# Patient Record
Sex: Female | Born: 1955
Health system: Southern US, Community
[De-identification: ages and names within clinical notes are randomized; demographics above are authoritative.]

## PROBLEM LIST (undated history)

## (undated) DIAGNOSIS — F419 Anxiety disorder, unspecified: Secondary | ICD-10-CM

## (undated) DIAGNOSIS — F319 Bipolar disorder, unspecified: Secondary | ICD-10-CM

## (undated) DIAGNOSIS — K5732 Diverticulitis of large intestine without perforation or abscess without bleeding: Secondary | ICD-10-CM

## (undated) DIAGNOSIS — F32A Depression, unspecified: Secondary | ICD-10-CM

## (undated) DIAGNOSIS — F329 Major depressive disorder, single episode, unspecified: Secondary | ICD-10-CM

## (undated) DIAGNOSIS — K5792 Diverticulitis of intestine, part unspecified, without perforation or abscess without bleeding: Secondary | ICD-10-CM

## (undated) HISTORY — DX: Bipolar disorder, unspecified: F31.9

## (undated) HISTORY — DX: Anxiety disorder, unspecified: F41.9

## (undated) HISTORY — PX: COLONOSCOPY: SHX174

## (undated) HISTORY — DX: Diverticulitis of intestine, part unspecified, without perforation or abscess without bleeding: K57.92

## (undated) HISTORY — DX: Depression, unspecified: F32.A

## (undated) HISTORY — DX: Diverticulitis of large intestine without perforation or abscess without bleeding: K57.32

## (undated) HISTORY — DX: Major depressive disorder, single episode, unspecified: F32.9

## (undated) HISTORY — PX: OTHER SURGICAL HISTORY: SHX169

## (undated) HISTORY — PX: TUBAL LIGATION: SHX77

## (undated) SURGERY — COLONOSCOPY WITH PROPOFOL
Anesthesia: Monitor Anesthesia Care

---

## 2008-07-31 ENCOUNTER — Emergency Department (HOSPITAL_COMMUNITY): Admission: EM | Admit: 2008-07-31 | Discharge: 2008-07-31 | Payer: Self-pay | Admitting: Family Medicine

## 2008-08-08 ENCOUNTER — Ambulatory Visit (HOSPITAL_COMMUNITY): Admission: RE | Admit: 2008-08-08 | Discharge: 2008-08-08 | Payer: Self-pay | Admitting: Family Medicine

## 2008-08-19 ENCOUNTER — Encounter (HOSPITAL_COMMUNITY): Admission: RE | Admit: 2008-08-19 | Discharge: 2008-09-18 | Payer: Self-pay | Admitting: Family Medicine

## 2008-09-18 ENCOUNTER — Ambulatory Visit: Payer: Self-pay | Admitting: *Deleted

## 2008-09-18 ENCOUNTER — Inpatient Hospital Stay (HOSPITAL_COMMUNITY): Admission: AD | Admit: 2008-09-18 | Discharge: 2008-09-19 | Payer: Self-pay | Admitting: *Deleted

## 2008-11-19 ENCOUNTER — Emergency Department (HOSPITAL_COMMUNITY): Admission: EM | Admit: 2008-11-19 | Discharge: 2008-11-20 | Payer: Self-pay | Admitting: Emergency Medicine

## 2008-12-31 ENCOUNTER — Ambulatory Visit (HOSPITAL_COMMUNITY): Admission: RE | Admit: 2008-12-31 | Discharge: 2008-12-31 | Payer: Self-pay | Admitting: Family Medicine

## 2009-01-07 ENCOUNTER — Ambulatory Visit (HOSPITAL_COMMUNITY): Admission: RE | Admit: 2009-01-07 | Discharge: 2009-01-07 | Payer: Self-pay | Admitting: Family Medicine

## 2009-02-11 ENCOUNTER — Ambulatory Visit (HOSPITAL_COMMUNITY): Admission: RE | Admit: 2009-02-11 | Discharge: 2009-02-11 | Payer: Self-pay | Admitting: Family Medicine

## 2010-02-18 ENCOUNTER — Ambulatory Visit (HOSPITAL_COMMUNITY)
Admission: RE | Admit: 2010-02-18 | Discharge: 2010-02-18 | Payer: Self-pay | Source: Home / Self Care | Attending: Internal Medicine | Admitting: Internal Medicine

## 2010-03-10 ENCOUNTER — Emergency Department (HOSPITAL_COMMUNITY)
Admission: EM | Admit: 2010-03-10 | Discharge: 2010-03-10 | Payer: Self-pay | Source: Home / Self Care | Admitting: Emergency Medicine

## 2010-03-12 ENCOUNTER — Ambulatory Visit
Admission: RE | Admit: 2010-03-12 | Discharge: 2010-03-12 | Payer: Self-pay | Source: Home / Self Care | Attending: Internal Medicine | Admitting: Internal Medicine

## 2010-04-09 ENCOUNTER — Ambulatory Visit (HOSPITAL_COMMUNITY)
Admission: RE | Admit: 2010-04-09 | Discharge: 2010-04-09 | Disposition: A | Discharge status: Home / Self Care | Payer: BC Managed Care – PPO | Attending: Internal Medicine | Admitting: Internal Medicine

## 2010-04-09 ENCOUNTER — Other Ambulatory Visit (INDEPENDENT_AMBULATORY_CARE_PROVIDER_SITE_OTHER): Payer: Self-pay | Admitting: Internal Medicine

## 2010-04-09 ENCOUNTER — Ambulatory Visit: Admit: 2010-04-09 | Payer: Self-pay | Admitting: Internal Medicine

## 2010-04-09 ENCOUNTER — Encounter (HOSPITAL_BASED_OUTPATIENT_CLINIC_OR_DEPARTMENT_OTHER): Payer: BC Managed Care – PPO | Admitting: Internal Medicine

## 2010-04-09 DIAGNOSIS — Z860100 Personal history of colon polyps, unspecified: Secondary | ICD-10-CM | POA: Insufficient documentation

## 2010-04-09 DIAGNOSIS — R197 Diarrhea, unspecified: Secondary | ICD-10-CM | POA: Insufficient documentation

## 2010-04-09 DIAGNOSIS — K5289 Other specified noninfective gastroenteritis and colitis: Secondary | ICD-10-CM | POA: Insufficient documentation

## 2010-04-09 DIAGNOSIS — K5730 Diverticulosis of large intestine without perforation or abscess without bleeding: Secondary | ICD-10-CM | POA: Insufficient documentation

## 2010-04-12 NOTE — Consult Note (Signed)
[name], [name]            ACCOUNT NO.:  [id number]  MEDICAL RECORD NO.:  [id number]           PATIENT TYPE:  LOCATION:                                 FACILITY:  PHYSICIAN:  Lionel December, M.D.    DATE OF BIRTH:  Jan 27, 1956  DATE OF CONSULTATION: DATE OF DISCHARGE:                                CONSULTATION   REASON FOR CONSULTATION:  Diarrhea.  HISTORY OF PRESENT ILLNESS:  [name] is a 55 year old white female referred to our office by Dr. Sherwood Gambler for diarrhea of 78-month duration. She states when her symptoms started, her diarrhea was occurring a couple of nights a week and then progressively became heavier.  She states she had been sleeping with a towel under her due to the fecal seepage. The diarrhea is so spontaneous, she does not make it to the bathroom most times.  Her appetite is good.  She has had no weight loss.  She is having approximately 8 stools a day.  Her stools are light brown in color with no substance.  Her stools are very watery.  There is no associated fever with her symptoms.  She does complain of lower abdominal pain which comes and goes.  Previously, she was having 2-3 stools a day before the symptoms started.  She had been on antibiotics 6 months ago fordiverticulitis.  She did go to the emergency room recently this pastTuesday.  Her CT scan was normal.  Her CBC; WBC count was 6.6,  RBC count was 3.86, hemoglobin was 13.0, hematocrit was 37.9, MCV was 98.2, neutrophil 67, no eosinophils.  Sodium was 140, potassium was 3.6, chloride was 104, carbon dioxide 27, glucose 81, BUN 12, creatinine was 0.85, calcium 9.5.  Total protein 6.3, albumin 3.9, AST 16, ALT 15, ALP 63, total bilirubin was 0.3.  Her urine showed 3-6 wbc's and 0-2 rbc's. Her CT revealed scattered diverticula involving the descending and sigmoid colon without evidence of acute diverticulitis.  Normal- appearing small bowel.  A lipoma involving ileocecal valve.  Normal appendix in  the right mid abdomen.  Distending almost to the midline. There was no acute abnormalities involving the abdomen or pelvis.  Her last colonoscopy was February 23, 2010, by Dr. Karilyn Cota and the biopsy revealed a tubular adenoma.  She underwent a colonoscopy for average risk screening colonoscopy.  She is allergic to DILAUDID which causes itching.  SURGERIES:  She has had her ovaries removed for benign tumor.  She has had a tubal ligation.  PREVIOUS MEDICAL HISTORY:  Includes bipolar, high cholesterol, deep depression, diverticulitis, and a left frozen shoulder.  FAMILY HISTORY:  Her mother is deceased from oral cancer.  Her father is alive in good health.  She has 2 sisters in good health.  One brother in good health.  SOCIAL HISTORY:  She is single.  She is unemployed.  She does not smoke, drink, or do drugs.  She has 2 children in good health.  HOME MEDICATIONS: 1. Wellbutrin 150 mg 3 tabs a day. 2. Zoloft 100 mg a day. 3. Simvastatin 20 mg a day. 4. Xanax 2 mg twice a day. 5. Risperdal 0.5  mg at night. 6. Naproxen 500 mg 1 a day. 7. Trazodone 100 mg at night. 8. Lamotrigine 150 mg at night.  OBJECTIVE:  VITAL SIGNS:  Her weight is 192.9, her height is 5 feet 7 inches, her BMI is 31, her temp is 97.6, blood pressure is 102/74, and pulse is 76. HEENT:  Her sclerae anicteric.  Her conjunctivae pink.  She has natural teeth.  Her oral mucosa is moist.  There is no lesions. NECK:  Her thyroid is normal.  There is no cervical lymphadenopathy. LUNGS:  Clear. ABDOMEN:  Soft.  Bowel sounds are present.  Her stool was guaiac negative today.  There was no impaction felt.  There was no edema to her extremities.  ASSESSMENT:  [name] is a 55 year old female, presenting today with diarrhea for 1 month's duration.  An inflammatory process needs to be ruled out.  RECOMMENDATIONS:  We will get a stools studies for O&P, C. difficile, cultures and WBC.  She is to take Imodium 1 in  the morning and 1 at night twice a day or she may increase up to 3 times a day.  If her stool studies are normal, she will either undergo colonoscopy or sigmoidoscopy with Dr. Karilyn Cota and I have discussed this case with Dr. Karilyn Cota.    ______________________________ Dorene Ar, NP   ______________________________ Lionel December, M.D.    TS/MEDQ  D:  03/12/2010  T:  03/13/2010  Job:  [id number]  Electronically Signed by Dorene Ar PA on 04/09/2010 09:26:15 AM Electronically Signed by Lionel December M.D. on 04/12/2010 01:47:43 PM

## 2010-04-18 NOTE — Op Note (Signed)
  [name], [name]            ACCOUNT NO.:  [id number]  MEDICAL RECORD NO.:  [id number]           PATIENT TYPE:  O  LOCATION:  DAYP                          FACILITY:  [facility]  PHYSICIAN:  Lionel December, M.D.    DATE OF BIRTH:  09-Mar-1955  DATE OF PROCEDURE:  04/09/2010 DATE OF DISCHARGE:                              OPERATIVE REPORT   PROCEDURE:  Colonoscopy.  INDICATION:  [name] is 55 year old Caucasian female with several week history of diarrhea and negative stool studies.  Her last colonoscopy was over 3 years ago with removal of a small tubular adenoma.  She is undergoing diagnostic colonoscopy.  Procedures were reviewed with the patient.  Informed consent was obtained.  MEDS FOR CONSCIOUS SEDATION:  Demerol 40 mg IV, Versed 8 mg IV, promethazine 12.5 mg IV in diluted form.  FINDINGS:  Procedure performed in endoscopy suite.  The patient's vital signs and O2 sat were monitored during the procedure and remained stable.  The patient was placed in left lateral recumbent position and rectal examination performed.  No abnormality noted on external or digital exam.  Pentax videoscope was placed through rectum and advanced under vision into sigmoid colon beyond.  Preparation was excellent.  She had scattered diverticula at sigmoid and descending colon.  Scope was passed into cecum which was identified by lipomatous ileocecal valve. There are two small lipomas at the cecum.  The larger of the two was about 7-8 mm.  Short segment of GI was also examined and revealed normal- appearing mucosa.  As the scope was withdrawn, colonic mucosa was once again carefully examined and was normal throughout.  Random biopsies were taken from mucosa of sigmoid colon.  Rectal mucosa similarly was normal.  Scope was retroflexed to examine, anorectal junction was unremarkable.  Endoscope was withdrawn.  Withdrawal time was 9 minutes. The patient tolerated the procedure well.  FINAL  DIAGNOSES: 1. Normal terminal ileum. 2. Mild left-sided diverticulosis. 3. Normal colonic mucosa.  Biopsies taken from sigmoid colon looking     for microscopic and/or collagenous colitis.  RECOMMENDATIONS: 1. Standard instructions given. 2. High-fiber diet plus fiber supplement 3-4 g daily.  She can take 1-     2 mg Imodium OTC daily p.r.n. if she needs to.  I will be     contacting patient with results of biopsy and further     recommendations.     Lionel December, M.D.     NR/MEDQ  D:  04/09/2010  T:  04/09/2010  Job:  [id number]  cc:   Madelin Rear. Sherwood Gambler, MD Fax: [phone]  Electronically Signed by Lionel December M.D. on 04/12/2010 01:48:06 PM

## 2010-05-18 LAB — URINE MICROSCOPIC-ADD ON

## 2010-05-18 LAB — COMPREHENSIVE METABOLIC PANEL
ALT: 15 U/L (ref 0–35)
AST: 16 U/L (ref 0–37)
Albumin: 3.9 g/dL (ref 3.5–5.2)
Alkaline Phosphatase: 63 U/L (ref 39–117)
BUN: 12 mg/dL (ref 6–23)
CO2: 27 mEq/L (ref 19–32)
Calcium: 9.5 mg/dL (ref 8.4–10.5)
Chloride: 104 mEq/L (ref 96–112)
Creatinine, Ser: 0.85 mg/dL (ref 0.4–1.2)
GFR calc Af Amer: >60 mL/min (ref >60)
GFR calc non Af Amer: >60 mL/min (ref >60)
Glucose, Bld: 81 mg/dL (ref 70–99)
Potassium: 3.6 mEq/L (ref 3.5–5.1)
Sodium: 140 mEq/L (ref 135–145)
Total Bilirubin: 0.3 mg/dL (ref 0.3–1.2)
Total Protein: 6.3 g/dL (ref 6.0–8.3)

## 2010-05-18 LAB — DIFFERENTIAL
Basophils Absolute: 0 10*3/uL (ref 0.0–0.1)
Basophils Relative: 0 % (ref 0–1)
Eosinophils Absolute: 0 10*3/uL (ref 0.0–0.7)
Eosinophils Relative: 0 % (ref 0–5)
Lymphocytes Relative: 21 % (ref 12–46)
Lymphs Abs: 1.4 10*3/uL (ref 0.7–4.0)
Monocytes Absolute: 0.7 10*3/uL (ref 0.1–1.0)
Monocytes Relative: 11 % (ref 3–12)
Neutro Abs: 4.5 10*3/uL (ref 1.7–7.7)
Neutrophils Relative %: 67 % (ref 43–77)

## 2010-05-18 LAB — URINALYSIS, ROUTINE W REFLEX MICROSCOPIC
Bilirubin Urine: NEGATIVE
Glucose, UA: NEGATIVE mg/dL
Ketones, ur: NEGATIVE mg/dL
Leukocytes, UA: NEGATIVE
Nitrite: NEGATIVE
Protein, ur: NEGATIVE mg/dL
Specific Gravity, Urine: 1.01 (ref 1.005–1.030)
Urobilinogen, UA: 0.2 mg/dL (ref 0.0–1.0)
pH: 5.5 (ref 5.0–8.0)

## 2010-05-18 LAB — CBC
HCT: 37.9 % (ref 36.0–46.0)
Hemoglobin: 13 g/dL (ref 12.0–15.0)
MCH: 33.7 pg (ref 26.0–34.0)
MCHC: 34.3 g/dL (ref 30.0–36.0)
MCV: 98.2 fL (ref 78.0–100.0)
Platelets: 192 10*3/uL (ref 150–400)
RBC: 3.86 MIL/uL — ABNORMAL LOW (ref 3.87–5.11)
RDW: 12.9 % (ref 11.5–15.5)
WBC: 6.6 10*3/uL (ref 4.0–10.5)

## 2010-05-18 LAB — LIPASE, BLOOD: Lipase: 31 U/L (ref 11–59)

## 2010-05-18 LAB — STOOL CULTURE

## 2010-05-25 ENCOUNTER — Ambulatory Visit (INDEPENDENT_AMBULATORY_CARE_PROVIDER_SITE_OTHER): Payer: BC Managed Care – PPO | Admitting: Internal Medicine

## 2010-06-12 LAB — CBC
HCT: 38.8 % (ref 36.0–46.0)
Hemoglobin: 13.7 g/dL (ref 12.0–15.0)
MCHC: 35.4 g/dL (ref 30.0–36.0)
MCV: 100.1 fL — ABNORMAL HIGH (ref 78.0–100.0)
Platelets: 235 10*3/uL (ref 150–400)
RBC: 3.87 MIL/uL (ref 3.87–5.11)
RDW: 12.8 % (ref 11.5–15.5)
WBC: 6.8 10*3/uL (ref 4.0–10.5)

## 2010-06-12 LAB — DIFFERENTIAL
Basophils Absolute: 0 10*3/uL (ref 0.0–0.1)
Basophils Relative: 0 % (ref 0–1)
Eosinophils Absolute: 0 10*3/uL (ref 0.0–0.7)
Eosinophils Relative: 0 % (ref 0–5)
Lymphocytes Relative: 31 % (ref 12–46)
Lymphs Abs: 2.1 10*3/uL (ref 0.7–4.0)
Monocytes Absolute: 0.6 10*3/uL (ref 0.1–1.0)
Monocytes Relative: 9 % (ref 3–12)
Neutro Abs: 4.1 10*3/uL (ref 1.7–7.7)
Neutrophils Relative %: 61 % (ref 43–77)

## 2010-06-12 LAB — BASIC METABOLIC PANEL
BUN: 11 mg/dL (ref 6–23)
CO2: 32 mEq/L (ref 19–32)
Calcium: 9.4 mg/dL (ref 8.4–10.5)
Chloride: 105 mEq/L (ref 96–112)
Creatinine, Ser: 0.69 mg/dL (ref 0.4–1.2)
GFR calc Af Amer: >60 mL/min (ref >60)
GFR calc non Af Amer: >60 mL/min (ref >60)
Glucose, Bld: 103 mg/dL — ABNORMAL HIGH (ref 70–99)
Potassium: 3.7 mEq/L (ref 3.5–5.1)
Sodium: 139 mEq/L (ref 135–145)

## 2010-06-12 LAB — URINALYSIS, ROUTINE W REFLEX MICROSCOPIC
Bilirubin Urine: NEGATIVE
Glucose, UA: NEGATIVE mg/dL
Ketones, ur: NEGATIVE mg/dL
Leukocytes, UA: NEGATIVE
Nitrite: NEGATIVE
Protein, ur: NEGATIVE mg/dL
Specific Gravity, Urine: 1.015 (ref 1.005–1.030)
Urobilinogen, UA: 0.2 mg/dL (ref 0.0–1.0)
pH: 6 (ref 5.0–8.0)

## 2010-06-12 LAB — RAPID URINE DRUG SCREEN, HOSP PERFORMED
Amphetamines: NOT DETECTED
Barbiturates: NOT DETECTED
Benzodiazepines: POSITIVE — AB
Cocaine: NOT DETECTED
Opiates: NOT DETECTED
Tetrahydrocannabinol: NOT DETECTED

## 2010-06-12 LAB — URINE MICROSCOPIC-ADD ON

## 2010-06-12 LAB — ETHANOL: Alcohol, Ethyl (B): <5 mg/dL (ref 0–10)

## 2010-06-15 LAB — COMPREHENSIVE METABOLIC PANEL
ALT: 16 U/L (ref 0–35)
AST: 17 U/L (ref 0–37)
Albumin: 3.8 g/dL (ref 3.5–5.2)
Alkaline Phosphatase: 90 U/L (ref 39–117)
BUN: 10 mg/dL (ref 6–23)
CO2: 28 mEq/L (ref 19–32)
Calcium: 9.5 mg/dL (ref 8.4–10.5)
Chloride: 108 mEq/L (ref 96–112)
Creatinine, Ser: 0.72 mg/dL (ref 0.4–1.2)
GFR calc Af Amer: >60 mL/min (ref >60)
GFR calc non Af Amer: >60 mL/min (ref >60)
Glucose, Bld: 98 mg/dL (ref 70–99)
Potassium: 4.7 mEq/L (ref 3.5–5.1)
Sodium: 140 mEq/L (ref 135–145)
Total Bilirubin: 0.6 mg/dL (ref 0.3–1.2)
Total Protein: 6.6 g/dL (ref 6.0–8.3)

## 2010-06-15 LAB — TSH: TSH: 1.522 u[IU]/mL (ref 0.350–4.500)

## 2010-06-15 LAB — CBC
HCT: 42.7 % (ref 36.0–46.0)
Hemoglobin: 14.5 g/dL (ref 12.0–15.0)
MCHC: 34 g/dL (ref 30.0–36.0)
MCV: 101.6 fL — ABNORMAL HIGH (ref 78.0–100.0)
Platelets: 254 10*3/uL (ref 150–400)
RBC: 4.2 MIL/uL (ref 3.87–5.11)
RDW: 13.3 % (ref 11.5–15.5)
WBC: 7.4 10*3/uL (ref 4.0–10.5)

## 2010-06-16 LAB — URINALYSIS, ROUTINE W REFLEX MICROSCOPIC
Bilirubin Urine: NEGATIVE
Glucose, UA: NEGATIVE mg/dL
Ketones, ur: NEGATIVE mg/dL
Leukocytes, UA: NEGATIVE
Nitrite: NEGATIVE
Protein, ur: NEGATIVE mg/dL
Specific Gravity, Urine: 1.02 (ref 1.005–1.030)
Urobilinogen, UA: 0.2 mg/dL (ref 0.0–1.0)
pH: 6 (ref 5.0–8.0)

## 2010-06-16 LAB — COMPREHENSIVE METABOLIC PANEL
ALT: 24 U/L (ref 0–35)
AST: 24 U/L (ref 0–37)
Albumin: 4.1 g/dL (ref 3.5–5.2)
Alkaline Phosphatase: 83 U/L (ref 39–117)
BUN: 9 mg/dL (ref 6–23)
CO2: 31 mEq/L (ref 19–32)
Calcium: 9.9 mg/dL (ref 8.4–10.5)
Chloride: 106 mEq/L (ref 96–112)
Creatinine, Ser: 0.68 mg/dL (ref 0.4–1.2)
GFR calc Af Amer: >60 mL/min (ref >60)
GFR calc non Af Amer: >60 mL/min (ref >60)
Glucose, Bld: 81 mg/dL (ref 70–99)
Potassium: 4.5 mEq/L (ref 3.5–5.1)
Sodium: 142 mEq/L (ref 135–145)
Total Bilirubin: 0.4 mg/dL (ref 0.3–1.2)
Total Protein: 6.9 g/dL (ref 6.0–8.3)

## 2010-06-16 LAB — CBC
HCT: 41.5 % (ref 36.0–46.0)
Hemoglobin: 14.8 g/dL (ref 12.0–15.0)
MCHC: 35.6 g/dL (ref 30.0–36.0)
MCV: 100 fL (ref 78.0–100.0)
Platelets: 258 10*3/uL (ref 150–400)
RBC: 4.15 MIL/uL (ref 3.87–5.11)
RDW: 13.2 % (ref 11.5–15.5)
WBC: 8.1 10*3/uL (ref 4.0–10.5)

## 2010-06-16 LAB — DIFFERENTIAL
Basophils Absolute: 0 10*3/uL (ref 0.0–0.1)
Basophils Relative: 0 % (ref 0–1)
Eosinophils Absolute: 0 10*3/uL (ref 0.0–0.7)
Eosinophils Relative: 0 % (ref 0–5)
Lymphocytes Relative: 28 % (ref 12–46)
Lymphs Abs: 2.3 10*3/uL (ref 0.7–4.0)
Monocytes Absolute: 0.7 10*3/uL (ref 0.1–1.0)
Monocytes Relative: 9 % (ref 3–12)
Neutro Abs: 5.1 10*3/uL (ref 1.7–7.7)
Neutrophils Relative %: 63 % (ref 43–77)

## 2010-06-16 LAB — URINE MICROSCOPIC-ADD ON

## 2010-06-16 LAB — LIPASE, BLOOD: Lipase: 26 U/L (ref 11–59)

## 2010-07-21 NOTE — Discharge Summary (Signed)
[name], [name]            ACCOUNT NO.:  [id number]   MEDICAL RECORD NO.:  [id number]          PATIENT TYPE:  IPS   LOCATION:  0305                          FACILITY:  [facility]   PHYSICIAN:  [name], M.D. DATE OF BIRTH:  Jun 16, 1955   DATE OF ADMISSION:  09/18/2008  DATE OF DISCHARGE:  09/19/2008                               DISCHARGE SUMMARY   IDENTIFICATION:  This is a 55 year old divorced white female from  [place], [place] who was admitted on a voluntary basis.   HISTORY OF PRESENT ILLNESS:  The patient was sent here by her therapist  due to not functioning.  For the past 9 months, the patient has had  panic attacks 2-3 times daily.  She reports her mother passed away in  November 23, 2022 and she has stayed with her father for 7 months since then.  He has not been doing well and she is very concerned about his mental  status.  She states now she is not suicidal and never was.  She had made  a vague comment about not wanting to live like this or about not caring  if she lives.  However, she states she is very religious and would not  attempt to hurt herself and would not do this to her family. The patient  will see Dr. Tomasa [name] as an outpatient on September 22, 2008.  She has  recently seen [name], the therapist in Dr. Milas Hock clinic at  [facility].  She likes her and wants to continue working  with her.  She is currently on trazodone 100 mg p.o. q.h.s., the Effexor  75 mg b.i.d., and lorazepam 2 mg t.i.d.   SUBSTANCE ABUSE HISTORY:  The patient denies any alcohol or drug use.   PAST MEDICAL HISTORY:  The patient has diverticulitis and IBS.  She is  on medications as indicated above in the past psychiatric history.   ALLERGIES:  She is allergic to Demerol, which causes itching.   SOCIAL HISTORY:  The patient was with her father and has a very  supportive family with 3 siblings.  Father is also supportive.  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  She has no legal problems reported.   PHYSICAL FINDINGS:  There were no acute physical or medical problems  noted.   HOSPITAL COURSE:  As indicated above upon admission, the patient stated  she was not suicidal.  She was casually dressed with good eye contact.  Motor behavior was within normal limits and speech was normal rate and  flow.  She was alert and oriented x4.  She was depressed and anxious  with appropriate affect.  However, there was no suicidal or homicidal  ideation.  No evidence of psychosis or thought disorder.  The patient  was stable and wanted to return home.  She reports she has a stable  supportive extended family and also her father in addition she has 2  sons to a very supportive.  Her one daughter-in-law was due to have a  grandchild anytime now and she  wanted to be out to be there for the  birth of the baby.  The patient was felt to be safe for discharge today  and she wanted to go home.  Followup was continued with both Dr.  Tomasa [name] and [name] at [facility].   DISCHARGE DIAGNOSES:  Axis I:  Major depressive disorder, single episode  severe without psychotic features, and anxiety disorder, not otherwise  specified.  Axis II:  None.  Axis III:  Diverticulitis and irritable bowel syndrome.  Axis IV:  Severe (loss of mother, caring for her elderly father, and  periods of psychiatric illness).  Axis V:  Global assessment of functioning was 50 upon discharge.  Global  assessment of functioning was 30 upon admission.  Global assessment of  functioning highest past year was 75.   DISCHARGE PLANS:  There was no specific activity level or dietary  restrictions.   POST HOSPITAL CARE PLANS:  The patient will see Dr. Tomasa [name] on September 22, 2008, at 9 o'clock.  She will see [name] at [facility] for Dr. Tomasa [name] works for followup therapy.   DISCHARGE  MEDICATIONS:  1. Effexor XR 75 mg 1 p.o. b.i.d.  2. Trazodone as prescribed for sleep.  3. Ativan as prescribed for anxiety.  TSH was done and was 1.522 on      September 19, 2008, which was within normal limits.      [name], M.D.  Electronically Signed     [facility]/MEDQ  D:  09/19/2008  T:  09/20/2008  Job:  [id number]

## 2010-07-31 ENCOUNTER — Other Ambulatory Visit (HOSPITAL_COMMUNITY): Payer: Self-pay | Admitting: Internal Medicine

## 2010-07-31 DIAGNOSIS — D4860 Neoplasm of uncertain behavior of unspecified breast: Secondary | ICD-10-CM

## 2010-08-12 ENCOUNTER — Ambulatory Visit (HOSPITAL_COMMUNITY): Payer: BC Managed Care – PPO

## 2011-08-30 ENCOUNTER — Telehealth (INDEPENDENT_AMBULATORY_CARE_PROVIDER_SITE_OTHER): Payer: Self-pay | Admitting: *Deleted

## 2011-08-30 NOTE — Telephone Encounter (Signed)
[facility] has requested a refill on Ondansetron 4 mg tablets, take 1 tablet by mouth every 6 hours as needed for nausea.

## 2011-08-31 NOTE — Telephone Encounter (Signed)
This has been addressed.

## 2013-12-12 ENCOUNTER — Encounter (INDEPENDENT_AMBULATORY_CARE_PROVIDER_SITE_OTHER): Payer: Self-pay | Admitting: *Deleted

## 2013-12-12 ENCOUNTER — Encounter (INDEPENDENT_AMBULATORY_CARE_PROVIDER_SITE_OTHER): Payer: Self-pay | Admitting: Internal Medicine

## 2013-12-12 ENCOUNTER — Ambulatory Visit (INDEPENDENT_AMBULATORY_CARE_PROVIDER_SITE_OTHER): Payer: BC Managed Care – PPO | Admitting: Internal Medicine

## 2013-12-12 VITALS — BP 100/62 | HR 72 | Temp 97.8°F | Ht 67.0 in | Wt 195.1 lb

## 2013-12-12 DIAGNOSIS — F319 Bipolar disorder, unspecified: Secondary | ICD-10-CM

## 2013-12-12 DIAGNOSIS — R109 Unspecified abdominal pain: Secondary | ICD-10-CM

## 2013-12-12 DIAGNOSIS — R100 Acute abdomen: Secondary | ICD-10-CM

## 2013-12-12 DIAGNOSIS — R1030 Lower abdominal pain, unspecified: Secondary | ICD-10-CM

## 2013-12-12 HISTORY — DX: Bipolar disorder, unspecified: F31.9

## 2013-12-12 LAB — CBC WITH DIFFERENTIAL/PLATELET
Basophils Absolute: 0 10*3/uL (ref 0.0–0.1)
Basophils Relative: 0 % (ref 0–1)
Eosinophils Absolute: 0.1 10*3/uL (ref 0.0–0.7)
Eosinophils Relative: 2 % (ref 0–5)
HCT: 41.4 % (ref 36.0–46.0)
Hemoglobin: 14.6 g/dL (ref 12.0–15.0)
Lymphocytes Relative: 20 % (ref 12–46)
Lymphs Abs: 1.4 10*3/uL (ref 0.7–4.0)
MCH: 33 pg (ref 26.0–34.0)
MCHC: 35.3 g/dL (ref 30.0–36.0)
MCV: 93.5 fL (ref 78.0–100.0)
Monocytes Absolute: 0.6 10*3/uL (ref 0.1–1.0)
Monocytes Relative: 8 % (ref 3–12)
Neutro Abs: 5 10*3/uL (ref 1.7–7.7)
Neutrophils Relative %: 70 % (ref 43–77)
Platelets: 246 10*3/uL (ref 150–400)
RBC: 4.43 MIL/uL (ref 3.87–5.11)
RDW: 13.1 % (ref 11.5–15.5)
WBC: 7.1 10*3/uL (ref 4.0–10.5)

## 2013-12-12 MED ORDER — METRONIDAZOLE 500 MG PO TABS: 500.0000 mg | ORAL_TABLET | Freq: Two times a day (BID) | ORAL | Status: DC

## 2013-12-12 MED ORDER — HYDROCODONE-ACETAMINOPHEN 5-300 MG PO TABS: 20.0000 | ORAL_TABLET | Freq: Four times a day (QID) | ORAL | Status: DC | PRN

## 2013-12-12 MED ORDER — CIPROFLOXACIN HCL 500 MG PO TABS: 500.0000 mg | ORAL_TABLET | Freq: Two times a day (BID) | ORAL | Status: DC

## 2013-12-12 NOTE — Patient Instructions (Signed)
CT abdomen/pelvis with CM. GI pathogen, CBC, and CMet. Cipro and Flagyl x 14 days.

## 2013-12-12 NOTE — Progress Notes (Signed)
   Subjective:    Patient ID: [name], female    DOB: 03/31/1955, 58 y.o.   MRN: [id number]  HPI Presents today with c/o that for the past week she has been having diarrhea. She was having 5-6 stools a day. Saturday she had abdominal pain. She had pain in the lower abdomen.  She says the bowel movements increased. For the past 48 hrs her stools have increased to 10 a day and in small amts She says when she eats, she will have diarrhea. She says the pain comes and goes in waves. She says when she had her last flare of diverticular flare, she had constipation and had a fever. She says she has been running her fire logs at home. She is not sure if she has ran a fever.  Admitted to [facility] 7 yrs ago with diverticulitis x  5 days She is on a bland diet.  Recently returned from [place] from a business trip. No recent antibiotics Hx of Lympocytic colitis in 2013 and treated with Entocort EC 9mg  x  2 weeks, 6mg  x 2 weeks, 3mg  x 2 weeks.   04/10/2011 Colonoscopy:  PROCEDURE: Colonoscopy.  INDICATION: [name] is 58 year old Caucasian female with several week  history of diarrhea and negative stool studies. Her last colonoscopy  was over 3 years ago with removal of a small tubular adenoma. She is  undergoing diagnostic colonoscopy. Procedures were reviewed with the  patient. Informed consent was obtained.   FINAL DIAGNOSES:  1. Normal terminal ileum.  2. Mild left-sided diverticulosis.  3. Normal colonic mucosa. Biopsies taken from sigmoid colon looking  for microscopic and/or collagenous colitis.  Biopsy: lymphocytic colitis.  Review of Systems     Objective:   Physical Exam  Filed Vitals:   12/12/13 1518  BP: 100/62  Pulse: 72  Temp: 97.8 F (36.6 C)  Height: 5\' 7"  (1.702 m)  Weight: 195 lb 1.6 oz (88.497 kg)  Alert and oriented. Skin warm and dry. Oral mucosa is moist.   . Sclera anicteric, conjunctivae is pink. Thyroid not enlarged. No cervical lymphadenopathy. Lungs  clear. Heart regular rate and rhythm.  Abdomen is soft. Bowel sounds are positive. No hepatomegaly. No abdominal masses felt. No tenderness.  No edema to lower extremities.          Assessment & Plan:  Lower abdominal pain. Possible diverticulitis. Possible lymphocytic colitis.  Diarrhea: no recent antibiotics. Patient was advised to go to the ED if pain became unbearable.   Imodium BID. Bland diet. CT abdomen/pelvis with CM.  CBC, CMET. Stool studies vicodin #20 Rx. No refills.  Rx for Cipro and Flagyl x 14 days If CT does not show diverticulitis am going stop Cipro and Flagyl and start her on Entocort.

## 2013-12-13 ENCOUNTER — Ambulatory Visit (HOSPITAL_COMMUNITY)
Admission: RE | Admit: 2013-12-13 | Discharge: 2013-12-13 | Disposition: A | Discharge status: Home / Self Care | Payer: BC Managed Care – PPO | Source: Ambulatory Visit | Attending: Internal Medicine | Admitting: Internal Medicine

## 2013-12-13 DIAGNOSIS — K760 Fatty (change of) liver, not elsewhere classified: Secondary | ICD-10-CM | POA: Diagnosis not present

## 2013-12-13 DIAGNOSIS — R1030 Lower abdominal pain, unspecified: Secondary | ICD-10-CM

## 2013-12-13 DIAGNOSIS — K529 Noninfective gastroenteritis and colitis, unspecified: Secondary | ICD-10-CM | POA: Insufficient documentation

## 2013-12-13 DIAGNOSIS — R109 Unspecified abdominal pain: Secondary | ICD-10-CM | POA: Diagnosis present

## 2013-12-13 LAB — GASTROINTESTINAL PATHOGEN PANEL PCR
C. difficile Tox A/B, PCR: NEGATIVE
Campylobacter, PCR: NEGATIVE
Cryptosporidium, PCR: NEGATIVE
E coli (ETEC) LT/ST PCR: NEGATIVE
E coli (STEC) stx1/stx2, PCR: NEGATIVE
E coli 0157, PCR: NEGATIVE
Giardia lamblia, PCR: NEGATIVE
Norovirus, PCR: NEGATIVE
Rotavirus A, PCR: NEGATIVE
Salmonella, PCR: NEGATIVE
Shigella, PCR: NEGATIVE

## 2013-12-13 LAB — COMPREHENSIVE METABOLIC PANEL
ALT: 19 U/L (ref 0–35)
AST: 17 U/L (ref 0–37)
Albumin: 4.3 g/dL (ref 3.5–5.2)
Alkaline Phosphatase: 75 U/L (ref 39–117)
BUN: 8 mg/dL (ref 6–23)
CO2: 27 mEq/L (ref 19–32)
Calcium: 9 mg/dL (ref 8.4–10.5)
Chloride: 105 mEq/L (ref 96–112)
Creat: 0.76 mg/dL (ref 0.50–1.10)
Glucose, Bld: 88 mg/dL (ref 70–99)
Potassium: 3.8 mEq/L (ref 3.5–5.3)
Sodium: 139 mEq/L (ref 135–145)
Total Bilirubin: 0.4 mg/dL (ref 0.2–1.2)
Total Protein: 6.3 g/dL (ref 6.0–8.3)

## 2013-12-13 MED ORDER — IOHEXOL 300 MG/ML  SOLN
100.0000 mL | Freq: Once | INTRAMUSCULAR | Status: AC | PRN
  Administered 2013-12-13: 100 mL via INTRAVENOUS

## 2014-12-10 ENCOUNTER — Encounter: Payer: BLUE CROSS/BLUE SHIELD | Attending: Surgery | Admitting: Surgery

## 2014-12-10 DIAGNOSIS — F329 Major depressive disorder, single episode, unspecified: Secondary | ICD-10-CM | POA: Insufficient documentation

## 2014-12-10 DIAGNOSIS — G40909 Epilepsy, unspecified, not intractable, without status epilepticus: Secondary | ICD-10-CM | POA: Insufficient documentation

## 2014-12-10 DIAGNOSIS — S81812A Laceration without foreign body, left lower leg, initial encounter: Secondary | ICD-10-CM | POA: Diagnosis not present

## 2014-12-10 DIAGNOSIS — L97222 Non-pressure chronic ulcer of left calf with fat layer exposed: Secondary | ICD-10-CM | POA: Diagnosis not present

## 2014-12-10 DIAGNOSIS — F17219 Nicotine dependence, cigarettes, with unspecified nicotine-induced disorders: Secondary | ICD-10-CM | POA: Insufficient documentation

## 2014-12-10 DIAGNOSIS — X58XXXA Exposure to other specified factors, initial encounter: Secondary | ICD-10-CM | POA: Diagnosis not present

## 2014-12-11 NOTE — Progress Notes (Signed)
JESSYCA, SLOAN ([id number]) Visit Report for 12/10/2014 Allergy List Details Patient Name: [name], [name] Date of Service: 12/10/2014 2:30 PM Medical Record Number: [id number] Patient Account Number: [id number] Date of Birth/Sex: 12/23/1955 (59 y.o. Female) Treating RN: [name] Primary Care Physician: [name] Other Clinician: Referring Physician: Treating Physician/Extender: [name] in Treatment: 0 Allergies Active Allergies No Known Drug Allergies Allergy Notes Electronic Signature(s) Signed: 12/10/2014 5:33:23 PM By: Shelia Cool, RN, BSN, Kim RN, BSN Entered By: Shelia Cool, RN, BSN, [name] on 12/10/2014 15:11:18 [name] ([id number]) -------------------------------------------------------------------------------- Arrival Information Details Patient Name: [name], [name] Date of Service: 12/10/2014 2:30 PM Medical Record Number: [id number] Patient Account Number: [id number] Date of Birth/Sex: 06-20-1955 (59 y.o. Female) Treating RN: [name] Primary Care Physician: [name] Other Clinician: Referring Physician: Treating Physician/Extender: [name] in Treatment: 0 Visit Information Patient Arrived: Ambulatory Arrival Time: 14:54 Accompanied By: friend Transfer Assistance: None Patient Identification Verified: Yes Secondary Verification Process Yes Completed: Patient Has Alerts: Yes Electronic Signature(s) Signed: 12/10/2014 5:33:23 PM By: Shelia Cool, RN, BSN, Kim RN, BSN Entered By: Shelia Cool, RN, BSN, [name] on 12/10/2014 14:55:37 [name], [name] ([id number]) -------------------------------------------------------------------------------- Clinic Level of Care Assessment Details Patient Name: [name], [name] Date of Service: 12/10/2014 2:30 PM Medical Record Number: [id number] Patient Account Number: [id number] Date of Birth/Sex: [birth year] (59 y.o. Female) Treating RN: [name] Primary Care Physician: [name] Other Clinician: Referring Physician: Treating Physician/Extender: [name] in Treatment: 0 Clinic Level of Care Assessment Items TOOL 1 Quantity Score []  - Use when EandM and Procedure is performed on INITIAL visit 0 ASSESSMENTS - Nursing Assessment / Reassessment []  - General Physical Exam (combine w/ comprehensive assessment (listed just 0 below) when performed on new pt. evals) X - Comprehensive Assessment (HX, ROS, Risk Assessments, Wounds Hx, etc.) 1 25 ASSESSMENTS - Wound and Skin Assessment / Reassessment []  - Dermatologic / Skin Assessment (not related to wound area) 0 ASSESSMENTS - Ostomy and/or Continence Assessment and Care []  - Incontinence Assessment and Management 0 []  - Ostomy Care Assessment and Management (repouching, etc.) 0 PROCESS - Coordination of Care X - Simple Patient / Family Education for ongoing care 1 15 []  - Complex (extensive) Patient / Family Education for ongoing care 0 X - Staff obtains Programmer, systems, Records, Test Results / Process Orders 1 10 []  - Staff telephones HHA, Nursing Homes / Clarify orders / etc 0 []  - Routine Transfer to another Facility (non-emergent condition) 0 []  - Routine [name] Admission (non-emergent condition) 0 X - New Admissions / Biomedical engineer / Ordering NPWT, Apligraf, etc. 1 15 []  - Emergency [name] Admission (emergent condition) 0 PROCESS - Special Needs []  - Pediatric / Minor Patient Management 0 []  - Isolation Patient Management 0 [name], [name] ([id number]) []  - Hearing / Language / Visual special needs 0 []  - Assessment of Community assistance (transportation, D/C planning, etc.) 0 []  - Additional assistance / Altered mentation 0 []  - Support Surface(s) Assessment (bed, cushion, seat, etc.) 0 INTERVENTIONS - Miscellaneous []  - External ear exam 0 []  - Patient Transfer (multiple staff / Civil Service fast streamer / Similar devices) 0 []  - Simple Staple / Suture removal (25 or less) 0 []  - Complex  Staple / Suture removal (26 or more) 0 []  - Hypo/Hyperglycemic Management (do not check if billed separately) 0 X - Ankle / Brachial Index (ABI) - do not check if billed separately 1 15 Has the patient been seen at the [name] within the last three years: Yes  Total Score: 80 Level Of Care: New/Established - Level 3 Electronic Signature(s) Signed: 12/10/2014 5:33:23 PM By: Shelia Cool, RN, BSN, Kim RN, BSN Entered By: Shelia Cool, RN, BSN, [name] on 12/10/2014 15:59:31 [name] ([id number]) -------------------------------------------------------------------------------- Encounter Discharge Information Details Patient Name: [name], [name] Date of Service: 12/10/2014 2:30 PM Medical Record Number: [id number] Patient Account Number: [id number] Date of Birth/Sex: 1955/08/13 (59 y.o. Female) Treating RN: [name] Primary Care Physician: [name] Other Clinician: Referring Physician: Treating Physician/Extender: [name] in Treatment: 0 Encounter Discharge Information Items Discharge Pain Level: 0 Discharge Condition: Stable Ambulatory Status: Ambulatory Discharge Destination: Home Transportation: Private Auto Accompanied By: friend Schedule Follow-up Appointment: Yes Medication Reconciliation completed and provided to Patient/Care Yes [name]: Provided on Clinical Summary of Care: 12/10/2014 Form Type Recipient Paper Patient CM Electronic Signature(s) Signed: 12/10/2014 4:11:12 PM By: [name] Entered By: [name] on 12/10/2014 16:11:11 [name] ([id number]) -------------------------------------------------------------------------------- Lower Extremity Assessment Details Patient Name: [name], [name] Date of Service: 12/10/2014 2:30 PM Medical Record Number: [id number] Patient Account Number: [id number] Date of Birth/Sex: [birth year] (59 y.o. Female) Treating RN: [name] Primary Care Physician: [name] Other  Clinician: Referring Physician: Treating Physician/Extender: [name] in Treatment: 0 Edema Assessment Assessed: [Left: No] [Right: No] Edema: [Left: No] [Right: No] Vascular Assessment Pulses: Posterior Tibial Palpable: [Left:Yes] [Right:Yes] Dorsalis Pedis Palpable: [Left:Yes] [Right:Yes] Extremity colors, hair growth, and conditions: Extremity Color: [Left:Mottled] [Right:Mottled] Hair Growth on Extremity: [Left:Yes] [Right:Yes] Temperature of Extremity: [Left:Warm] [Right:Warm] Capillary Refill: [Left:< 3 seconds] [Right:< 3 seconds] Blood Pressure: Brachial: [Right:120] Dorsalis Pedis: [Left:Dorsalis Pedis: 100] Ankle: Posterior Tibial: [Left:Posterior Tibial: 100] [Right:0.83] Toe Nail Assessment Left: Right: Thick: No No Discolored: No No Deformed: No No Improper Length and Hygiene: No No Notes Left ABI not done due to location of wound and pain. Electronic Signature(s) Signed: 12/10/2014 5:33:23 PM By: Shelia Cool, RN, BSN, Kim RN, BSN Entered By: Shelia Cool, RN, BSN, [name] on 12/10/2014 15:39:47 Clagg, Latiana AMarland Kitchen ([id number]) -------------------------------------------------------------------------------- Multi Wound Chart Details Patient Name: [name], [name] Date of Service: 12/10/2014 2:30 PM Medical Record Number: [id number] Patient Account Number: [id number] Date of Birth/Sex: Mar 19, 1955 (59 y.o. Female) Treating RN: [name] Primary Care Physician: [name] Other Clinician: Referring Physician: Treating Physician/Extender: [name] in Treatment: 0 Vital Signs Height(in): 67 Pulse(bpm): 90 Weight(lbs): 190 Blood Pressure 120/78 (mmHg): Body Mass Index(BMI): 30 Temperature(F): 98.0 Respiratory Rate 18 (breaths/min): Photos: [1:No Photos] [N/A:N/A] Wound Location: [1:Left Lower Leg - Medial] [N/A:N/A] Wounding Event: [1:Trauma] [N/A:N/A] Primary Etiology: [1:Trauma, Other] [N/A:N/A] Date Acquired: [1:11/09/2014]  [N/A:N/A] Weeks of Treatment: [1:0] [N/A:N/A] Wound Status: [1:Open] [N/A:N/A] Measurements L x W x D 1.1x3.2x0.2 [N/A:N/A] (cm) Area (cm) : [1:2.765] [N/A:N/A] Volume (cm) : [1:0.553] [N/A:N/A] % Reduction in Area: [1:0.00%] [N/A:N/A] % Reduction in Volume: 0.00% [N/A:N/A] Classification: [1:Full Thickness Without Exposed Support Structures] [N/A:N/A] Exudate Amount: [1:Small] [N/A:N/A] Exudate Type: [1:Serous] [N/A:N/A] Exudate Color: [1:amber] [N/A:N/A] Wound Margin: [1:Flat and Intact] [N/A:N/A] Granulation Amount: [1:Small (1-33%)] [N/A:N/A] Granulation Quality: [1:Red] [N/A:N/A] Necrotic Amount: [1:Medium (34-66%)] [N/A:N/A] Exposed Structures: [1:Fascia: No Fat: No Tendon: No Muscle: No Joint: No Bone: No] [N/A:N/A] Limited to Skin Breakdown Periwound Skin Texture: Scarring: Yes N/A N/A Edema: No Excoriation: No Induration: No Callus: No Crepitus: No Fluctuance: No Friable: No Rash: No Periwound Skin Moist: Yes N/A N/A Moisture: Maceration: No Dry/Scaly: No Periwound Skin Color: Hemosiderin Staining: Yes N/A N/A Atrophie Blanche: No Cyanosis: No Ecchymosis: No Erythema: No Mottled: No Pallor: No Rubor: No Tenderness on No N/A N/A Palpation:  Wound Preparation: Ulcer Cleansing: N/A N/A Rinsed/Irrigated with Saline Topical Anesthetic Applied: Other: lidociane 4% Treatment Notes Electronic Signature(s) Signed: 12/10/2014 5:33:23 PM By: Shelia Cool, RN, BSN, Kim RN, BSN Entered By: Shelia Cool, RN, BSN, [name] on 12/10/2014 15:26:15 [name] ([id number]) -------------------------------------------------------------------------------- [name] Details Patient Name: [name], [name] Date of Service: 12/10/2014 2:30 PM Medical Record Number: [id number] Patient Account Number: [id number] Date of Birth/Sex: Jul 17, 1955 (59 y.o. Female) Treating RN: [name] Primary Care Physician: [name] Other Clinician: Referring  Physician: Treating Physician/Extender: [name] in Treatment: 0 Active Inactive Orientation to the Wound Care Program Nursing Diagnoses: Knowledge deficit related to the wound healing center program Goals: Patient/caregiver will verbalize understanding of the [facility] Program Date Initiated: 12/10/2014 Goal Status: Active Interventions: Provide education on orientation to the wound center Notes: Wound/Skin Impairment Nursing Diagnoses: Impaired tissue integrity Goals: Ulcer/skin breakdown will heal within 14 weeks Date Initiated: 12/10/2014 Goal Status: Active Interventions: Assess patient/caregiver ability to obtain necessary supplies Treatment Activities: Skin care regimen initiated : 12/10/2014 Notes: Electronic Signature(s) Signed: 12/10/2014 5:33:23 PM By: Shelia Cool, RN, BSN, Kim RN, BSN Entered By: Shelia Cool, RN, BSN, [name] on 12/10/2014 15:25:58 Holzschuh, Nykayla AMarland Kitchen ([id number]) Poppen, Esraa AMarland Kitchen ([id number]) -------------------------------------------------------------------------------- Pain Assessment Details Patient Name: [name], [name] Date of Service: 12/10/2014 2:30 PM Medical Record Number: [id number] Patient Account Number: [id number] Date of Birth/Sex: 1956-02-25 (59 y.o. Female) Treating RN: [name] Primary Care Physician: [name] Other Clinician: Referring Physician: Treating Physician/Extender: [name] in Treatment: 0 Active Problems Location of Pain Severity and Description of Pain Patient Has Paino Yes Site Locations Pain Location: Pain in Ulcers With Dressing Change: No Character of Pain Describe the Pain: Tender Pain Management and Medication Current Pain Management: Medication: No Cold Application: No Rest: No Massage: No Activity: No T.E.N.S.: No Heat Application: No Leg drop or elevation: No Is the Current Pain Management Inadequate Adequate: How does your pain impact your activities of  daily livingo Sleep: No Bathing: No Appetite: No Relationship With Others: No Bladder Continence: No Emotions: No Bowel Continence: No Work: No Toileting: No Drive: No Dressing: No Hobbies: No Engineer, maintenance) Signed: 12/10/2014 5:33:23 PM By: Shelia Cool, RN, BSN, Kim RN, BSN Bonus, Devoiry AMarland Kitchen ([id number]) Entered By: Shelia Cool, RN, BSN, [name] on 12/10/2014 14:56:09 [name] ([id number]) -------------------------------------------------------------------------------- Patient/Caregiver Education Details Patient Name: [name], [name] A. Date of Service: 12/10/2014 2:30 PM Medical Record Number: [id number] Patient Account Number: [id number] Date of Birth/Gender: 1955/06/02 (59 y.o. Female) Treating RN: [name] Primary Care Physician: [name] Other Clinician: Referring Physician: Treating Physician/Extender: [name] in Treatment: 0 Education Assessment Education Provided To: Patient Education Topics Provided Wound/Skin Impairment: Handouts: Caring for Your Ulcer, Other: woiund care as prescribed Electronic Signature(s) Signed: 12/10/2014 5:33:23 PM By: Shelia Cool, RN, BSN, Kim RN, BSN Entered By: Shelia Cool, RN, BSN, [name] on 12/10/2014 16:02:51 Gypsy, Osmond ([id number]) -------------------------------------------------------------------------------- Wound Assessment Details Patient Name: [name], Makita A. Date of Service: 12/10/2014 2:30 PM Medical Record Number: [id number] Patient Account Number: [id number] Date of Birth/Sex: 08/02/1955 (59 y.o. Female) Treating RN: [name] Primary Care Physician: [name] Other Clinician: Referring Physician: Treating Physician/Extender: [name] in Treatment: 0 Wound Status Wound Number: 1 Primary Etiology: Trauma, Other Wound Location: Left Lower Leg - Medial Wound Status: Open Wounding Event: Trauma Date Acquired: 11/09/2014 Weeks Of Treatment: 0 Clustered Wound:  No Photos Photo Uploaded By: Shelia Cool, RN, BSN, [name] on 12/10/2014 17:24:43 Wound Measurements Length: (cm) 1.1 Width: (cm) 3.2 Depth: (cm)  0.2 Area: (cm) 2.765 Volume: (cm) 0.553 % Reduction in Area: 0% % Reduction in Volume: 0% Wound Description Full Thickness Without Exposed Classification: Support Structures Wound Margin: Flat and Intact Exudate Small Amount: Exudate Type: Serous Exudate Color: amber Wound Bed Granulation Amount: Small (1-33%) Exposed Structure Granulation Quality: Red Fascia Exposed: No Necrotic Amount: Medium (34-66%) Fat Layer Exposed: No Sherrard, Antonetta A. ([id number]) Necrotic Quality: Adherent Slough Tendon Exposed: No Muscle Exposed: No Joint Exposed: No Bone Exposed: No Limited to Skin Breakdown Periwound Skin Texture Texture Color No Abnormalities Noted: No No Abnormalities Noted: No Callus: No Atrophie Blanche: No Crepitus: No Cyanosis: No Excoriation: No Ecchymosis: No Fluctuance: No Erythema: No Friable: No Hemosiderin Staining: Yes Induration: No Mottled: No Localized Edema: No Pallor: No Rash: No Rubor: No Scarring: Yes Moisture No Abnormalities Noted: No Dry / Scaly: No Maceration: No Moist: Yes Wound Preparation Ulcer Cleansing: Rinsed/Irrigated with Saline Topical Anesthetic Applied: Other: lidociane 4%, Treatment Notes Wound #1 (Left, Medial Lower Leg) 1. Cleansed with: Clean wound with Normal Saline Cleanse wound with antibacterial soap and water 2. Anesthetic Topical Lidocaine 4% cream to wound bed prior to debridement 4. Dressing Applied: Iodosorb Ointment 5. Secondary Dressing Applied Gauze and Kerlix/Conform Notes stretch net Electronic Signature(s) Signed: 12/10/2014 5:33:23 PM By: Shelia Cool, RN, BSN, Kim RN, BSN Entered By: Shelia Cool, RN, BSN, [name] on 12/10/2014 15:11:00 [name] ([id number]) Langston, Lassie AMarland Kitchen  ([id number]) -------------------------------------------------------------------------------- Vitals Details Patient Name: [name], Deshanda A. Date of Service: 12/10/2014 2:30 PM Medical Record Number: [id number] Patient Account Number: [id number] Date of Birth/Sex: 1955-03-22 (59 y.o. Female) Treating RN: [name] Primary Care Physician: [name] Other Clinician: Referring Physician: Treating Physician/Extender: [name] in Treatment: 0 Vital Signs Time Taken: 14:56 Temperature (F): 98.0 Height (in): 67 Pulse (bpm): 90 Source: Stated Respiratory Rate (breaths/min): 18 Weight (lbs): 190 Blood Pressure (mmHg): 120/78 Source: Stated Reference Range: 80 - 120 mg / dl Body Mass Index (BMI): 29.8 Electronic Signature(s) Signed: 12/10/2014 5:33:23 PM By: Shelia Cool, RN, BSN, Kim RN, BSN Entered By: Shelia Cool, RN, BSN, [name] on 12/10/2014 14:58:56

## 2014-12-11 NOTE — Progress Notes (Signed)
[name] ([id number]) Visit Report for 12/10/2014 Chief Complaint Document Details Patient Name: [name], [name] 12/10/2014 2:30 Date of Service: PM Medical Record [id number] Number: Patient Account Number: [id number] [birth year] (59 y.o. Treating RN: Cornell Barman Date of Birth/Sex: Female) Other Clinician: Primary Care Physician: [name] Treating Rica Heather Referring Physician: Physician/Extender: Suella Grove in Treatment: 0 Information Obtained from: Patient Chief Complaint Patient presents to the wound care center for a consult due non healing wound. she has had a wound on her left lower extremity for about a month Electronic Signature(s) Signed: 12/10/2014 4:53:30 PM By: Christin Fudge MD, FACS Entered By: Christin Fudge on 12/10/2014 16:53:30 [name] ([id number]) -------------------------------------------------------------------------------- Debridement Details Patient Name: Shelia Lick A. 12/10/2014 2:30 Date of Service: PM Medical Record [id number] Number: Patient Account Number: [id number] [birth year] (59 y.o. Treating RN: Cornell Barman Date of Birth/Sex: Female) Other Clinician: Primary Care Physician: [name] Treating Yedidya Duddy Referring Physician: Physician/Extender: Suella Grove in Treatment: 0 Debridement Performed for Wound #1 Left,Medial Lower Leg Assessment: Performed By: Physician Christin Fudge, MD Debridement: Debridement Pre-procedure Yes Verification/Time Out Taken: Start Time: 15:50 Pain Control: Other : lidocaine 4% Level: Skin/Subcutaneous Tissue Total Area Debrided (L x 1.1 (cm) x 3.2 (cm) = 3.52 (cm) W): Tissue and other Viable, Non-Viable, Eschar, Exudate, Fibrin/Slough, Subcutaneous material debrided: Instrument: Forceps, Scissors Bleeding: Minimum Hemostasis Achieved: Pressure End Time: 15:56 Procedural Pain: 0 Post Procedural Pain: 0 Response to Treatment: Procedure was tolerated well Post  Debridement Measurements of Total Wound Length: (cm) 1.1 Width: (cm) 3.2 Depth: (cm) 0.2 Volume: (cm) 0.553 Post Procedure Diagnosis Same as Pre-procedure Electronic Signature(s) Signed: 12/10/2014 4:53:11 PM By: Christin Fudge MD, FACS Signed: 12/10/2014 5:33:23 PM By: Gretta Cool RN, BSN, Kim RN, BSN Entered By: Christin Fudge on 12/10/2014 16:53:11 Collister, Kyrra AMarland Kitchen ([id number]) -------------------------------------------------------------------------------- HPI Details Patient Name: Shelia Lick A. 12/10/2014 2:30 Date of Service: PM Medical Record [id number] Number: Patient Account Number: [id number] [birth year] (59 y.o. Treating RN: Cornell Barman Date of Birth/Sex: Female) Other Clinician: Primary Care Physician: [name] Treating Cariana Karge Referring Physician: Physician/Extender: Suella Grove in Treatment: 0 History of Present Illness Location: left lower extremity Quality: Patient reports experiencing a dull pain to affected area(s). Severity: Patient states wound are getting worse. Duration: Patient has had the wound for < 4 weeks prior to presenting for treatment Timing: Pain in wound is Intermittent (comes and goes Context: The wound occurred when the patient had a fall and lacerated her left lower extremity about a month ago. She was sutured in the ER and sutures were removed in 15 days. Modifying Factors: Other treatment(s) tried include: he has been on doxycycline and clindamycin and has been applying local anti-biotic ointment over the wound Associated Signs and Symptoms: Patient reports having difficulty standing for long periods. HPI Description: this 59 year old patient had a lacerated wound to the left lower extremity about a month ago. This was sutured in the ER and the sutures were removed in about 2 weeks' time. The wound then got infected and gaped. She was seen in the urgent care a couple of days ago with an infection and was put on doxycycline  and clindamycin. She has been a smoker for several years and smokes about 10 cigarettes a day. Does not have diabetes mellitus. Electronic Signature(s) Signed: 12/10/2014 4:55:25 PM By: Christin Fudge MD, FACS Entered By: Christin Fudge on 12/10/2014 16:55:25 [name] ([id number]) -------------------------------------------------------------------------------- Physical Exam Details Patient Name: Shelia Lick A. 12/10/2014 2:30 Date of Service: PM Medical Record [id number] Number: Patient  Account Number: [id number] [birth year] (59 y.o. Treating RN: Cornell Barman Date of Birth/Sex: Female) Other Clinician: Primary Care Physician: [name] Treating Reiley Keisler Referring Physician: Physician/Extender: Suella Grove in Treatment: 0 Constitutional . Pulse regular. Respirations normal and unlabored. Afebrile. . Eyes Nonicteric. Reactive to light. Ears, Nose, Mouth, and Throat Lips, teeth, and gums WNL.Marland Kitchen Moist mucosa without lesions . Neck supple and nontender. No palpable supraclavicular or cervical adenopathy. Normal sized without goiter. Respiratory WNL. No retractions.. Cardiovascular Pedal Pulses WNL. ABI on the right was 0.83 and the left could not be measured.. No clubbing, cyanosis or edema. Gastrointestinal (GI) Abdomen without masses or tenderness.. No liver or spleen enlargement or tenderness.. Lymphatic No adneopathy. No adenopathy. No adenopathy. Musculoskeletal Adexa without tenderness or enlargement.. Digits and nails w/o clubbing, cyanosis, infection, petechiae, ischemia, or inflammatory conditions.. Integumentary (Hair, Skin) No suspicious lesions. No crepitus or fluctuance. No peri-wound warmth or erythema. No masses.Marland Kitchen Psychiatric Judgement and insight Intact.. No evidence of depression, anxiety, or agitation.. Notes She has got a lacerated wound which is open and has subcutaneous slough and surrounding minimal erythema. Sharp dissection was done  with a forcep and scissors. Electronic Signature(s) Signed: 12/10/2014 4:56:14 PM By: Christin Fudge MD, FACS Entered By: Christin Fudge on 12/10/2014 16:56:14 [name] ([id number]) -------------------------------------------------------------------------------- Physician Orders Details Patient Name: Shelia Lick A. 12/10/2014 2:30 Date of Service: PM Medical Record [id number] Number: Patient Account Number: [id number] 1956-02-24 (59 y.o. Treating RN: Cornell Barman Date of Birth/Sex: Female) Other Clinician: Primary Care Physician: [name] Treating Christin Fudge Referring Physician: Physician/Extender: Suella Grove in Treatment: 0 Verbal / Phone Orders: Yes Clinician: Cornell Barman Read Back and Verified: Yes Diagnosis Coding Wound Cleansing Wound #1 Left,Medial Lower Leg o Clean wound with Normal Saline. Anesthetic Wound #1 Left,Medial Lower Leg o Topical Lidocaine 4% cream applied to wound bed prior to debridement Primary Wound Dressing Wound #1 Left,Medial Lower Leg o Iodosorb Ointment Secondary Dressing Wound #1 Left,Medial Lower Leg o Gauze and Kerlix/Conform Dressing Change Frequency Wound #1 Left,Medial Lower Leg o Change dressing every day. Follow-up Appointments Wound #1 Left,Medial Lower Leg o Return Appointment in 1 week. Edema Control Wound #1 Left,Medial Lower Leg o Elevate legs to the level of the heart and pump ankles as often as possible Additional Orders / Instructions Wound #1 Left,Medial Lower Leg o Stop Smoking o Increase protein intake. o Activity as tolerated Bachicha, Derriana A. ([id number]) Electronic Signature(s) Signed: 12/10/2014 5:06:09 PM By: Christin Fudge MD, FACS Signed: 12/10/2014 5:33:23 PM By: Gretta Cool RN, BSN, Kim RN, BSN Entered By: Gretta Cool, RN, BSN, Kim on 12/10/2014 15:59:04 [name] ([id number]) -------------------------------------------------------------------------------- Problem List  Details Patient Name: AMBUR, PROVINCE A. 12/10/2014 2:30 Date of Service: PM Medical Record [id number] Number: Patient Account Number: [id number] May 24, 1955 (59 y.o. 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 12/10/2014 2:30 Date of Service: PM Medical Record [id number] Number: Patient Account Number: [id number] 1955-11-21 (59 y.o. Treating RN: Cornell Barman Date of  Birth/Sex: Female) Other Clinician: Primary Care Physician: Brain Hilts, Haylei Cobin Referring Physician: Physician/Extender: Suella Grove in Treatment: 0 Subjective Chief Complaint Information obtained from Patient Patient presents to the wound care center for a consult due non healing wound. she has had a wound on her left lower extremity for about a month History of Present Illness (HPI) The following HPI elements were documented for the patient's wound: Location: left lower extremity Quality: Patient reports experiencing a dull pain to affected area(s). Severity: Patient states wound are getting worse. Duration: Patient has had the wound for < 4 weeks prior to presenting  for treatment Timing: Pain in wound is Intermittent (comes and goes Context: The wound occurred when the patient had a fall and lacerated her left lower extremity about a month ago. She was sutured in the ER and sutures were removed in 15 days. Modifying Factors: Other treatment(s) tried include: he has been on doxycycline and clindamycin and has been applying local anti-biotic ointment over the wound Associated Signs and Symptoms: Patient reports having difficulty standing for long periods. this 59 year old patient had a lacerated wound to the left lower extremity about a month ago. This was sutured in the ER and the sutures were removed in about 2 weeks' time. The wound then got infected and gaped. She was seen in the urgent care a couple of days ago with an infection and was put on doxycycline and clindamycin. She has been a smoker for several years and smokes about 10 cigarettes a day. Does not have diabetes mellitus. Wound History Patient presents with 1 open wound that has been present for approximately 4 weeks. Patient has been treating wound in the following manner: mupiricin cream. Laboratory tests have not been performed in the last month. Patient reportedly has not tested positive for an antibiotic resistant organism. Patient reportedly has not tested positive for osteomyelitis. Patient reportedly has not had testing performed to evaluate circulation in the legs. Patient experiences the following problems associated with their wounds: infection. Patient History Information obtained from Patient. Vassey, Dennisse A. ([id number]) Allergies No Known Drug Allergies Family History Cancer - Mother, Father, Stroke - Maternal Grandparents, No family history of Diabetes, Heart Disease, Hypertension, Kidney Disease, Lung Disease, Seizures, Thyroid Problems. Social History Current every day smoker, Marital Status - Single, Alcohol Use - Moderate, Drug Use - No History, Caffeine Use -  Daily. Medical History Eyes Denies history of Cataracts, Glaucoma, Optic Neuritis Ear/Nose/Mouth/Throat Denies history of Chronic sinus problems/congestion, Middle ear problems Hematologic/Lymphatic Denies history of Anemia, Hemophilia, Human Immunodeficiency Virus, Lymphedema, Sickle Cell Disease Respiratory Denies history of Aspiration, Asthma, Chronic Obstructive Pulmonary Disease (COPD), Pneumothorax, Sleep Apnea, Tuberculosis Cardiovascular Denies history of Angina, Arrhythmia, Congestive Heart Failure, Coronary Artery Disease, Deep Vein Thrombosis, Hypertension, Hypotension, Myocardial Infarction, Peripheral Arterial Disease, Peripheral Venous Disease, Phlebitis, Vasculitis Gastrointestinal Denies history of Cirrhosis , Colitis, Crohn s, Hepatitis A Endocrine Denies history of Type I Diabetes, Type II Diabetes Genitourinary Denies history of End Stage Renal Disease Immunological Denies history of Lupus Erythematosus, Raynaud s, Scleroderma Integumentary (Skin) Denies history of History of Burn, History of pressure wounds Musculoskeletal Denies history of Gout, Rheumatoid Arthritis, Osteoarthritis, Osteomyelitis Neurologic Patient has history of Seizure Disorder - Sensory seizures Denies history of Dementia, Neuropathy, Quadriplegia, Paraplegia Oncologic Denies history of Received Chemotherapy, Received Radiation Psychiatric Denies history of Anorexia/bulimia, Confinement Anxiety Medical And Surgical History Notes Constitutional Symptoms (General Health) bi-polar; depression, anxiety; diverticulitis; ovaries removed; sensory siezures Oplinger, Quantia A. ([id number]) Review of Systems (ROS) Constitutional Symptoms (  General Health) The patient has no complaints or symptoms. Eyes The patient has no complaints or symptoms. Ear/Nose/Mouth/Throat The patient has no complaints or symptoms. Hematologic/Lymphatic The patient has no complaints or symptoms. Respiratory The  patient has no complaints or symptoms. Cardiovascular The patient has no complaints or symptoms. Gastrointestinal The patient has no complaints or symptoms. Endocrine The patient has no complaints or symptoms. Genitourinary Denies complaints or symptoms of Kidney failure/ Dialysis, Incontinence/dribbling. Immunological The patient has no complaints or symptoms. Integumentary (Skin) Complains or has symptoms of Wounds. Denies complaints or symptoms of Bleeding or bruising tendency, Breakdown, Swelling. Musculoskeletal The patient has no complaints or symptoms. Neurologic The patient has no complaints or symptoms. Oncologic The patient has no complaints or symptoms. Psychiatric The patient has no complaints or symptoms. Medications Xanax 0.25 mg tablet oral tablet oral as needed Lamictal 5 mg chewable dispersible tablet oral tablet, chewable dispersible oral clindamycin 300 mg capsule oral 1 capsule oral three times daily bupropion HCl SR 150 mg tablet,sustained-release oral 3 3 tablet extended release oral trazodone 100 mg tablet oral tablet oral as needed doxycycline hyclate 100 mg capsule oral capsule oral once daily mupirocin 2 % topical ointment topical ointment topical once daily Objective Arcidiacono, Landen A. ([id number]) Constitutional Pulse regular. Respirations normal and unlabored. Afebrile. Vitals Time Taken: 2:56 PM, Height: 67 in, Source: Stated, Weight: 190 lbs, Source: Stated, BMI: 29.8, Temperature: 98.0 F, Pulse: 90 bpm, Respiratory Rate: 18 breaths/min, Blood Pressure: 120/78 mmHg. Eyes Nonicteric. Reactive to light. Ears, Nose, Mouth, and Throat Lips, teeth, and gums WNL.Marland Kitchen Moist mucosa without lesions . Neck supple and nontender. No palpable supraclavicular or cervical adenopathy. Normal sized without goiter. Respiratory WNL. No retractions.. Cardiovascular Pedal Pulses WNL. ABI on the right was 0.83 and the left could not be measured.. No clubbing,  cyanosis or edema. Gastrointestinal (GI) Abdomen without masses or tenderness.. No liver or spleen enlargement or tenderness.. Lymphatic No adneopathy. No adenopathy. No adenopathy. Musculoskeletal Adexa without tenderness or enlargement.. Digits and nails w/o clubbing, cyanosis, infection, petechiae, ischemia, or inflammatory conditions.Marland Kitchen Psychiatric Judgement and insight Intact.. No evidence of depression, anxiety, or agitation.. General Notes: She has got a lacerated wound which is open and has subcutaneous slough and surrounding minimal erythema. Sharp dissection was done with a forcep and scissors. Integumentary (Hair, Skin) No suspicious lesions. No crepitus or fluctuance. No peri-wound warmth or erythema. No masses.. Wound #1 status is Open. Original cause of wound was Trauma. The wound is located on the Left,Medial Lower Leg. The wound measures 1.1cm length x 3.2cm width x 0.2cm depth; 2.765cm^2 area and 0.553cm^3 volume. The wound is limited to skin breakdown. There is a small amount of serous drainage noted. The wound margin is flat and intact. There is small (1-33%) red granulation within the wound bed. There is a medium (34-66%) amount of necrotic tissue within the wound bed including Adherent Slough. The periwound skin appearance exhibited: Scarring, Moist, Hemosiderin Staining. The periwound skin Stanhope, Galen A. ([id number]) appearance did not exhibit: Callus, Crepitus, Excoriation, Fluctuance, Friable, Induration, Localized Edema, Rash, Dry/Scaly, Maceration, Atrophie Blanche, Cyanosis, Ecchymosis, Mottled, Pallor, Rubor, Erythema. Assessment Active Problems ICD-10 S81.812A - Laceration without foreign body, left lower leg, initial encounter L97.222 - Non-pressure chronic ulcer of left calf with fat layer exposed F17.219 - Nicotine dependence, cigarettes, with unspecified nicotine-induced disorders I have recommended I resolved ointment into the depths of the wound  and then covering with a light gauze dressing and a bordered foam. She will do the dressings  on a daily basis and she can shower and wash with soap and water prior to the dressing. I spent a considerable bit of time discussing with her the need to stop smoking completely and I discussed the methodology to do this. She says she is going to be compliant. If wound continues to have slow healing we will need to do arterial duplex studies of both lower extremities. She has had all questions answered and she will see me back next week. Procedures Wound #1 Wound #1 is a Trauma, Other located on the Left,Medial Lower Leg . There was a Skin/Subcutaneous Tissue Debridement ([id number]) debridement with total area of 3.52 sq cm performed by Christin Fudge, MD. with the following instrument(s): Forceps and Scissors to remove Viable and Non-Viable tissue/material including Exudate, Fibrin/Slough, Eschar, and Subcutaneous after achieving pain control using Other (lidocaine 4%). A time out was conducted prior to the start of the procedure. A Minimum amount of bleeding was controlled with Pressure. The procedure was tolerated well with a pain level of 0 throughout and a pain level of 0 following the procedure. Post Debridement Measurements: 1.1cm length x 3.2cm width x 0.2cm depth; 0.553cm^3 volume. Post procedure Diagnosis Wound #1: Same as Pre-Procedure Sanderford, Lazariah A. ([id number]) Plan Wound Cleansing: Wound #1 Left,Medial Lower Leg: Clean wound with Normal Saline. Anesthetic: Wound #1 Left,Medial Lower Leg: Topical Lidocaine 4% cream applied to wound bed prior to debridement Primary Wound Dressing: Wound #1 Left,Medial Lower Leg: Iodosorb Ointment Secondary Dressing: Wound #1 Left,Medial Lower Leg: Gauze and Kerlix/Conform Dressing Change Frequency: Wound #1 Left,Medial Lower Leg: Change dressing every day. Follow-up Appointments: Wound #1 Left,Medial Lower Leg: Return Appointment in 1  week. Edema Control: Wound #1 Left,Medial Lower Leg: Elevate legs to the level of the heart and pump ankles as often as possible Additional Orders / Instructions: Wound #1 Left,Medial Lower Leg: Stop Smoking Increase protein intake. Activity as tolerated I have recommended I resolved ointment into the depths of the wound and then covering with a light gauze dressing and a bordered foam. She will do the dressings on a daily basis and she can shower and wash with soap and water prior to the dressing. I spent a considerable bit of time discussing with her the need to stop smoking completely and I discussed the methodology to do this. She says she is going to be compliant. If wound continues to have slow healing we will need to do arterial duplex studies of both lower extremities. She has had all questions answered and she will see me back next week. LATAYVIA, MANDUJANO ([id number]) Electronic Signature(s) Signed: 12/10/2014 4:58:19 PM By: Christin Fudge MD, FACS Entered By: Christin Fudge on 12/10/2014 16:58:19 [name] ([id number]) -------------------------------------------------------------------------------- ROS/PFSH Details Patient Name: Shelia Lick A. 12/10/2014 2:30 Date of Service: PM Medical Record [id number] Number: Patient Account Number: [id number] 06/06/1955 (59 y.o. Treating RN: Cornell Barman Date of Birth/Sex: Female) Other Clinician: Primary Care Physician: [name] Treating Halana Deisher Referring Physician: Physician/Extender: Suella Grove in Treatment: 0 Information Obtained From Patient Wound History Do you currently have one or more open woundso Yes How many open wounds do you currently haveo 1 Approximately how long have you had your woundso 4 weeks How have you been treating your wound(s) until nowo mupiricin cream Has your wound(s) ever healed and then re-openedo No Have you had any lab work done in the past montho No Have you tested  positive for an antibiotic resistant organism (MRSA, VRE)o No Have you tested positive for  osteomyelitis (bone infection)o No Have you had any tests for circulation on your legso No Have you had other problems associated with your woundso Infection Genitourinary Complaints and Symptoms: Negative for: Kidney failure/ Dialysis; Incontinence/dribbling Medical History: Negative for: End Stage Renal Disease Integumentary (Skin) Complaints and Symptoms: Positive for: Wounds Negative for: Bleeding or bruising tendency; Breakdown; Swelling Medical History: Negative for: History of Burn; History of pressure wounds Constitutional Symptoms (General Health) Complaints and Symptoms: No Complaints or Symptoms Medical History: Past Medical History Notes: bi-polar; depression, anxiety; diverticulitis; ovaries removed; sensory siezures Fugett, Jaslen A. ([id number]) Eyes Complaints and Symptoms: No Complaints or Symptoms Medical History: Negative for: Cataracts; Glaucoma; Optic Neuritis Ear/Nose/Mouth/Throat Complaints and Symptoms: No Complaints or Symptoms Medical History: Negative for: Chronic sinus problems/congestion; Middle ear problems Hematologic/Lymphatic Complaints and Symptoms: No Complaints or Symptoms Medical History: Negative for: Anemia; Hemophilia; Human Immunodeficiency Virus; Lymphedema; Sickle Cell Disease Respiratory Complaints and Symptoms: No Complaints or Symptoms Medical History: Negative for: Aspiration; Asthma; Chronic Obstructive Pulmonary Disease (COPD); Pneumothorax; Sleep Apnea; Tuberculosis Cardiovascular Complaints and Symptoms: No Complaints or Symptoms Medical History: Negative for: Angina; Arrhythmia; Congestive Heart Failure; Coronary Artery Disease; Deep Vein Thrombosis; Hypertension; Hypotension; Myocardial Infarction; Peripheral Arterial Disease; Peripheral Venous Disease; Phlebitis; Vasculitis Gastrointestinal Complaints and Symptoms: No  Complaints or Symptoms Medical History: Negative for: Cirrhosis ; Colitis; Crohnos; Hepatitis A Endocrine Matheson, Kattaleya A. ([id number]) Complaints and Symptoms: No Complaints or Symptoms Medical History: Negative for: Type I Diabetes; Type II Diabetes Immunological Complaints and Symptoms: No Complaints or Symptoms Medical History: Negative for: Lupus Erythematosus; Raynaudos; Scleroderma Musculoskeletal Complaints and Symptoms: No Complaints or Symptoms Medical History: Negative for: Gout; Rheumatoid Arthritis; Osteoarthritis; Osteomyelitis Neurologic Complaints and Symptoms: No Complaints or Symptoms Medical History: Positive for: Seizure Disorder - Sensory seizures Negative for: Dementia; Neuropathy; Quadriplegia; Paraplegia Oncologic Complaints and Symptoms: No Complaints or Symptoms Medical History: Negative for: Received Chemotherapy; Received Radiation Psychiatric Complaints and Symptoms: No Complaints or Symptoms Medical History: Negative for: Anorexia/bulimia; Confinement Anxiety Immunizations Tetanus Vaccine: Last tetanus shot: 11/09/2014 Vice, Dorlene A. ([id number]) Family and Social History Cancer: Yes - Mother, Father; Diabetes: No; Heart Disease: No; Hypertension: No; Kidney Disease: No; Lung Disease: No; Seizures: No; Stroke: Yes - Maternal Grandparents; Thyroid Problems: No; Current every day smoker; Marital Status - Single; Alcohol Use: Moderate; Drug Use: No History; Caffeine Use: Daily; Living Will: No; Medical Power of Attorney: No Physician Affirmation I have reviewed and agree with the above information. Electronic Signature(s) Signed: 12/10/2014 4:56:36 PM By: Christin Fudge MD, FACS Signed: 12/10/2014 5:33:23 PM By: Gretta Cool RN, BSN, Kim RN, BSN Entered By: Christin Fudge on 12/10/2014 16:56:35 Kirchman, Angelle AMarland Kitchen ([id number]) -------------------------------------------------------------------------------- [place] Details Patient Name:  [name], Alexyia A. Date of Service: 12/10/2014 Medical Record Number: [id number] Patient Account Number: [id number] Date of Birth/Sex: 12-04-1955 (59 y.o. Female) Treating RN: Cornell Barman Primary Care Physician: [name] Other Clinician: Referring Physician: Treating Physician/Extender: Frann Rider in Treatment: 0 Diagnosis Coding ICD-10 Codes Code Description [phone] Laceration without foreign body, left lower leg, initial encounter L97.222 Non-pressure chronic ulcer of left calf with fat layer exposed F17.219 Nicotine dependence, cigarettes, with unspecified nicotine-induced disorders Facility Procedures CPT4: Description Modifier Quantity Code 48185631 99213 - WOUND CARE VISIT-LEV 3 EST PT 1 CPT4: 49702637 11042 - DEB SUBQ TISSUE 20 SQ CM/< 1 ICD-10 Description Diagnosis S81.812A Laceration without foreign body, left lower leg, initial encounter L97.222 Non-pressure chronic ulcer of left calf with fat layer exposed F17.219 Nicotine  dependence, cigarettes, with unspecified nicotine-induced disorders Physician Procedures  CPT4: Description Modifier Quantity Code 5993570 17793 - WC PHYS LEVEL 4 - NEW PT 1 ICD-10 Description Diagnosis S81.812A Laceration without foreign body, left lower leg, initial encounter L97.222 Non-pressure chronic ulcer of left calf with fat layer  exposed F17.219 Nicotine dependence, cigarettes, with unspecified nicotine-induced disorders CPT4: 9030092 11042 - WC PHYS SUBQ TISS 20 SQ CM 1 ICD-10 Description Diagnosis S81.812A Laceration without foreign body, left lower leg, initial encounter L97.222 Non-pressure chronic ulcer of left calf with fat layer exposed McIntosh, Weed.  ([id number]) Electronic Signature(s) Signed: 12/10/2014 4:59:21 PM By: Christin Fudge MD, FACS Previous Signature: 12/10/2014 4:58:39 PM Version By: Christin Fudge MD, FACS Entered By: Christin Fudge on 12/10/2014 16:59:21

## 2014-12-11 NOTE — Progress Notes (Signed)
[name], [name] ([id number]) Visit Report for 12/10/2014 Abuse/Suicide Risk Screen Details Patient Name: [name], [name] 12/10/2014 2:30 Date of Service: PM Medical Record [id number] Number: Patient Account Number: [id number] 11-Feb-1956 (59 y.o. Treating RN: Cornell Barman Date of Birth/Sex: Female) Other Clinician: Primary Care Physician: [name] Treating Britto, Errol Referring Physician: Physician/Extender: Suella Grove in Treatment: 0 Abuse/Suicide Risk Screen Items Answer ABUSE/SUICIDE RISK SCREEN: Has anyone close to you tried to hurt or harm you recentlyo No Do you feel uncomfortable with anyone in your familyo No Has anyone forced you do things that you didnot want to doo No Do you have any thoughts of harming yourselfo No Patient displays signs or symptoms of abuse and/or neglect. No Electronic Signature(s) Signed: 12/10/2014 5:33:23 PM By: Gretta Cool, RN, BSN, Kim RN, BSN Entered By: Gretta Cool, RN, BSN, Kim on 12/10/2014 15:19:51 [name] ([id number]) -------------------------------------------------------------------------------- Activities of Daily Living Details Patient Name: [name], [name] 12/10/2014 2:30 Date of Service: PM Medical Record [id number] Number: Patient Account Number: [id number] 18-Jan-1956 (59 y.o. Treating RN: Cornell Barman Date of Birth/Sex: Female) Other Clinician: Primary Care Physician: [name] Treating Britto, Errol Referring Physician: Physician/Extender: Suella Grove in Treatment: 0 Activities of Daily Living Items Answer Activities of Daily Living (Please select one for each item) Drive Automobile Completely Able Take Medications Completely Able Use Telephone Completely Able Care for Appearance Completely Able Use Toilet Completely Able Bath / Shower Completely Able Dress Self Completely Able Feed Self Completely Able Walk Completely Able Get In / Out Bed Completely Able Housework Completely Able Prepare Meals  Completely Able Handle Money Completely Able Shop for Self Completely Able Electronic Signature(s) Signed: 12/10/2014 5:33:23 PM By: Gretta Cool, RN, BSN, Kim RN, BSN Entered By: Gretta Cool, RN, BSN, Kim on 12/10/2014 15:20:05 [name] ([id number]) -------------------------------------------------------------------------------- Education Assessment Details Patient Name: Shelia Lick A. 12/10/2014 2:30 Date of Service: PM Medical Record [id number] Number: Patient Account Number: [id number] 1956/02/28 (59 y.o. Treating RN: Cornell Barman Date of Birth/Sex: Female) Other Clinician: Primary Care Physician: [name] Treating Britto, Errol Referring Physician: Physician/Extender: Suella Grove in Treatment: 0 Learning Preferences/Education Level/Primary Language Learning Preference: Explanation, Demonstration Highest Education Level: High School Preferred Language: English Cognitive Barrier Assessment/Beliefs Language Barrier: No Translator Needed: No Memory Deficit: No Emotional Barrier: No Cultural/Religious Beliefs Affecting Medical No Care: Physical Barrier Assessment Impaired Vision: No Impaired Hearing: No Decreased Hand dexterity: No Knowledge/Comprehension Assessment Knowledge Level: High Comprehension Level: High Ability to understand written High instructions: Motivation Assessment Anxiety Level: Calm Cooperation: Cooperative Education Importance: Acknowledges Need Interest in Health Problems: Asks Questions Perception: Coherent Willingness to Engage in Self- High Management Activities: Readiness to Engage in Self- High Management Activities: Electronic Signature(s) Signed: 12/10/2014 5:33:23 PM By: Gretta Cool, RN, BSN, Kim RN, BSN Haughn, Angelo AMarland Kitchen ([id number]) Entered By: Gretta Cool RN, BSN, Kim on 12/10/2014 15:20:41 [name] ([id number]) -------------------------------------------------------------------------------- Fall Risk Assessment  Details Patient Name: Shelia Lick A. 12/10/2014 2:30 Date of Service: PM Medical Record [id number] Number: Patient Account Number: [id number] Aug 02, 1955 (59 y.o. Treating RN: Cornell Barman Date of Birth/Sex: Female) Other Clinician: Primary Care Physician: [name] Treating Britto, Errol Referring Physician: Physician/Extender: Suella Grove in Treatment: 0 Fall Risk Assessment Items FALL RISK ASSESSMENT: History of falling - immediate or within 3 months 25 Yes Secondary diagnosis 0 No Ambulatory aid None/bed rest/wheelchair/nurse 0 Yes Crutches/cane/walker 0 No Furniture 0 No IV Access/Saline Lock 0 No Gait/Training Normal/bed rest/immobile 0 Yes Weak 0 No Impaired 0 No Mental Status Oriented to own ability 0 Yes Electronic Signature(s) Signed: 12/10/2014 5:33:23  PM By: Gretta Cool, RN, BSN, Kim RN, BSN Entered By: Gretta Cool, RN, BSN, Kim on 12/10/2014 15:20:55 [name] ([id number]) -------------------------------------------------------------------------------- Nutrition Risk Assessment Details Patient Name: [name], [name] 12/10/2014 2:30 Date of Service: PM Medical Record [id number] Number: Patient Account Number: [id number] [birth year] (59 y.o. Treating RN: Cornell Barman Date of Birth/Sex: Female) Other Clinician: Primary Care Physician: [name] Treating Britto, Errol Referring Physician: Physician/Extender: Suella Grove in Treatment: 0 Height (in): 67 Weight (lbs): 190 Body Mass Index (BMI): 29.8 Nutrition Risk Assessment Items NUTRITION RISK SCREEN: I have an illness or condition that made me change the kind and/or 0 No amount of food I eat I eat fewer than two meals per day 0 No I eat few fruits and vegetables, or milk products 0 No I have three or more drinks of beer, liquor or wine almost every day 0 No I have tooth or mouth problems that make it hard for me to eat 0 No I don't always have enough money to buy the food I need 0 No I eat alone  most of the time 0 No I take three or more different prescribed or over-the-counter drugs a 0 No day Without wanting to, I have lost or gained 10 pounds in the last six 0 No months I am not always physically able to shop, cook and/or feed myself 0 No Nutrition Protocols Good Risk Protocol 0 No interventions needed Moderate Risk Protocol Electronic Signature(s) Signed: 12/10/2014 5:33:23 PM By: Gretta Cool, RN, BSN, Kim RN, BSN Entered By: Gretta Cool, RN, BSN, Kim on 12/10/2014 15:21:07

## 2014-12-17 ENCOUNTER — Encounter: Payer: BLUE CROSS/BLUE SHIELD | Admitting: Surgery

## 2014-12-17 DIAGNOSIS — L97222 Non-pressure chronic ulcer of left calf with fat layer exposed: Secondary | ICD-10-CM | POA: Diagnosis not present

## 2014-12-18 NOTE — Progress Notes (Signed)
[name], [name] ([id number]) Visit Report for 12/17/2014 Chief Complaint Document Details Patient Name: [name], [name] 12/17/2014 3:30 Date of Service: PM Medical Record [id number] Number: Patient Account Number: [id number] 05-30-1955 (59 y.o. Treating [name]: [name] Date of Birth/Sex: Female) Other Clinician: Primary Care Physician: [name] Treating [name] Referring Physician: Elsie [name] Physician/Extender: Weeks in Treatment: 1 Information Obtained from: Patient Chief Complaint Patient presents to the wound care center for a consult due non healing wound. she has had a wound on her left lower extremity for about a month Electronic Signature(s) Signed: 12/17/2014 4:28:12 PM By: Shelia Fudge MD, FACS Entered By: [name] on 12/17/2014 [place], [name] ([id number]) -------------------------------------------------------------------------------- Debridement Details Patient Name: Shelia Lick A. 12/17/2014 3:30 Date of Service: PM Medical Record [id number] Number: Patient Account Number: [id number] 27-Aug-1955 (60 y.o. Treating [name]: [name] Date of Birth/Sex: Female) Other Clinician: Primary Care Physician: [name] Referring Physician: Elsie [name] Physician/Extender: Weeks in Treatment: 1 Debridement Performed for Wound #1 Left,Medial Lower Leg Assessment: Performed By: Physician Shelia Fudge, MD Debridement: Debridement Pre-procedure Yes Verification/Time Out Taken: Start Time: 16:15 Pain Control: Other : lidocaine 4% Level: Skin/Subcutaneous Tissue Total Area Debrided (L x 1 (cm) x 3 (cm) = 3 (cm) W): Tissue and other Viable, Non-Viable, Eschar, Exudate, Fibrin/Slough, Skin, Subcutaneous material debrided: Instrument: Forceps Bleeding: Minimum Hemostasis Achieved: Pressure End Time: 16:18 Procedural Pain: 1 Post Procedural Pain: 1 Response to Treatment: Procedure  was tolerated well Post Debridement Measurements of Total Wound Length: (cm) 1 Width: (cm) 3 Depth: (cm) 0.3 Volume: (cm) 0.707 Post Procedure Diagnosis Same as Pre-procedure Electronic Signature(s) Signed: 12/17/2014 4:28:04 PM By: Shelia Fudge MD, FACS Signed: 12/17/2014 5:21:15 PM By: Shelia Cool, [name], BSN, [name], BSN Entered By: [name] on 12/17/2014 16:28:04 [name] ([id number]) -------------------------------------------------------------------------------- HPI Details Patient Name: Shelia Lick A. 12/17/2014 3:30 Date of Service: PM Medical Record [id number] Number: Patient Account Number: [id number] [birth year] (59 y.o. Treating [name]: [name] Date of Birth/Sex: Female) Other Clinician: Primary Care Physician: [name] Treating [name] Referring Physician: Elsie [name] Physician/Extender: Weeks in Treatment: 1 History of Present Illness Location: left lower extremity Quality: Patient reports experiencing a dull pain to affected area(s). Severity: Patient states wound are getting worse. Duration: Patient has had the wound for < 4 weeks prior to presenting for treatment Timing: Pain in wound is Intermittent (comes and goes Context: The wound occurred when the patient had a fall and lacerated her left lower extremity about a month ago. She was sutured in the ER and sutures were removed in 15 days. Modifying Factors: Other treatment(s) tried include: he has been on doxycycline and clindamycin and has been applying local anti-biotic ointment over the wound Associated Signs and Symptoms: Patient reports having difficulty standing for long periods. HPI Description: this 59 year old patient had a lacerated wound to the left lower extremity about a month ago. This was sutured in the ER and the sutures were removed in about 2 weeks' time. The wound then got infected and gaped. She was seen in the urgent care a couple of days ago with an  infection and was put on doxycycline and clindamycin. She has been a smoker for several years and smokes about 10 cigarettes a day. Does not have diabetes mellitus. 12/17/2014 -- it has been a few days since she is given of smoking completely and I have commended her on this. Electronic Signature(s) Signed: 12/17/2014 4:28:37 PM By: Shelia Fudge MD, FACS Entered By: [name]  [name] on 12/17/2014 16:28:37 [name] ([id number]) -------------------------------------------------------------------------------- Physical Exam Details Patient Name: [name], [name] 12/17/2014 3:30 Date of Service: PM Medical Record [id number] Number: Patient Account Number: [id number] [birth year] (59 y.o. Treating [name]: [name] Date of Birth/Sex: Female) Other Clinician: Primary Care Physician: [name] Treating [name] Referring Physician: Elsie [name] Physician/Extender: Weeks in Treatment: 1 Constitutional . Pulse regular. Respirations normal and unlabored. Afebrile. . Eyes Nonicteric. Reactive to light. Ears, Nose, Mouth, and Throat Lips, teeth, and gums WNL.Marland Kitchen Moist mucosa without lesions . Neck supple and nontender. No palpable supraclavicular or cervical adenopathy. Normal sized without goiter. Respiratory WNL. No retractions.. Cardiovascular Pedal Pulses WNL. No clubbing, cyanosis or edema. Lymphatic No adneopathy. No adenopathy. No adenopathy. Musculoskeletal Adexa without tenderness or enlargement.. Digits and nails w/o clubbing, cyanosis, infection, petechiae, ischemia, or inflammatory conditions.. Integumentary (Hair, Skin) No suspicious lesions. No crepitus or fluctuance. No peri-wound warmth or erythema. No masses.Marland Kitchen Psychiatric Judgement and insight Intact.. No evidence of depression, anxiety, or agitation.. Notes The wound has some eschar and the age and some exudate which has been sharply debrided with a forceps. She has a lot of tenderness and is  rather anxious. Once the debridement was done the wound looks clean except for some slough at the base. Electronic Signature(s) Signed: 12/17/2014 4:29:26 PM By: Shelia Fudge MD, FACS Entered By: [name] on 12/17/2014 16:29:26 [name] ([id number]) -------------------------------------------------------------------------------- Physician Orders Details Patient Name: Shelia Lick A. 12/17/2014 3:30 Date of Service: PM Medical Record [id number] Number: Patient Account Number: [id number] 09-25-1955 (59 y.o. Treating [name]: [name] Date of Birth/Sex: Female) Other Clinician: Primary Care Physician: [name] Referring Physician: Elsie [name] Physician/Extender: Suella Grove in Treatment: 1 Verbal / Phone Orders: Yes Clinician: Cornell [name] Read Back and Verified: Yes Diagnosis Coding Wound Cleansing Wound #1 Left,Medial Lower Leg o Clean wound with Normal Saline. Anesthetic Wound #1 Left,Medial Lower Leg o Topical Lidocaine 4% cream applied to wound bed prior to debridement Primary Wound Dressing Wound #1 Left,Medial Lower Leg o Other: - Medihoney Secondary Dressing Wound #1 Left,Medial Lower Leg o Conform/Kerlix o Non-adherent pad Dressing Change Frequency Wound #1 Left,Medial Lower Leg o Change dressing every other day. Follow-up Appointments Wound #1 Left,Medial Lower Leg o Return Appointment in 1 week. Edema Control Wound #1 Left,Medial Lower Leg o Elevate legs to the level of the heart and pump ankles as often as possible Additional Orders / Instructions Wound #1 Left,Medial Lower Leg o Stop Smoking o Increase protein intake. HAYDEN, MABIN ([id number]) o Activity as tolerated Electronic Signature(s) Signed: 12/17/2014 4:43:21 PM By: Shelia Fudge MD, FACS Signed: 12/17/2014 5:21:15 PM By: Shelia Cool [name], BSN, [name], BSN Entered By: Shelia Cool, [name], BSN, Shelia on 12/17/2014 16:18:25 [place],  [name] ([id number]) -------------------------------------------------------------------------------- Problem List Details Patient Name: [name], [name] 12/17/2014 3:30 Date of Service: PM Medical Record [id number] Number: Patient Account Number: [id number] Mar 15, 1955 (59 y.o. Treating [name]: [name] Date of Birth/Sex: Female) Other Clinician: Primary Care Physician: [name] Treating [name] Referring Physician: Elsie [name] Physician/Extender: Suella Grove in Treatment: 1 Active Problems ICD-10 Encounter Code Description Active Date Diagnosis S81.812A Laceration without foreign body, left lower leg, initial 12/10/2014 Yes encounter L97.222 Non-pressure chronic ulcer of left calf with fat layer 12/10/2014 Yes exposed F17.219 Nicotine dependence, cigarettes, with unspecified 12/10/2014 Yes nicotine-induced disorders Inactive Problems Resolved Problems Electronic Signature(s) Signed: 12/17/2014 4:27:55 PM By: Shelia Fudge MD, FACS Entered By: [name] on 12/17/2014 16:27:55 Castanon, Trecia A. ([id number]) -------------------------------------------------------------------------------- Progress Note  Details Patient Name: [name], [name] 12/17/2014 3:30 Date of Service: PM Medical Record [id number] Number: Patient Account Number: [id number] [birth year] (59 y.o. Treating [name]: [name] Date of Birth/Sex: Female) Other Clinician: Primary Care Physician: [name] Treating [name] Referring Physician: Elsie [name] Physician/Extender: Suella Grove in Treatment: 1 Subjective Chief Complaint Information obtained from Patient Patient presents to the wound care center for a consult due non healing wound. she has had a wound on her left lower extremity for about a month History of Present Illness (HPI) The following HPI elements were documented for the patient's wound: Location: left lower extremity Quality: Patient reports experiencing  a dull pain to affected area(s). Severity: Patient states wound are getting worse. Duration: Patient has had the wound for < 4 weeks prior to presenting for treatment Timing: Pain in wound is Intermittent (comes and goes Context: The wound occurred when the patient had a fall and lacerated her left lower extremity about a month ago. She was sutured in the ER and sutures were removed in 15 days. Modifying Factors: Other treatment(s) tried include: he has been on doxycycline and clindamycin and has been applying local anti-biotic ointment over the wound Associated Signs and Symptoms: Patient reports having difficulty standing for long periods. this 59 year old patient had a lacerated wound to the left lower extremity about a month ago. This was sutured in the ER and the sutures were removed in about 2 weeks' time. The wound then got infected and gaped. She was seen in the urgent care a couple of days ago with an infection and was put on doxycycline and clindamycin. She has been a smoker for several years and smokes about 10 cigarettes a day. Does not have diabetes mellitus. 12/17/2014 -- it has been a few days since she is given of smoking completely and I have commended her on this. Objective [name], [name] ([id number]) Constitutional Pulse regular. Respirations normal and unlabored. Afebrile. Vitals Time Taken: 3:56 PM, Height: 67 in, Weight: 190 lbs, BMI: 29.8, Temperature: 97.6 F, Pulse: 92 bpm, Respiratory Rate: 18 breaths/min, Blood Pressure: 117/95 mmHg. Eyes Nonicteric. Reactive to light. Ears, Nose, Mouth, and Throat Lips, teeth, and gums WNL.Marland Kitchen Moist mucosa without lesions . Neck supple and nontender. No palpable supraclavicular or cervical adenopathy. Normal sized without goiter. Respiratory WNL. No retractions.. Cardiovascular Pedal Pulses WNL. No clubbing, cyanosis or edema. Lymphatic No adneopathy. No adenopathy. No adenopathy. Musculoskeletal Adexa without  tenderness or enlargement.. Digits and nails w/o clubbing, cyanosis, infection, petechiae, ischemia, or inflammatory conditions.Marland Kitchen Psychiatric Judgement and insight Intact.. No evidence of depression, anxiety, or agitation.. General Notes: The wound has some eschar and the age and some exudate which has been sharply debrided with a forceps. She has a lot of tenderness and is rather anxious. Once the debridement was done the wound looks clean except for some slough at the base. Integumentary (Hair, Skin) No suspicious lesions. No crepitus or fluctuance. No peri-wound warmth or erythema. No masses.. Wound #1 status is Open. Original cause of wound was Trauma. The wound is located on the Left,Medial Lower Leg. The wound measures 1cm length x 3cm width x 0.3cm depth; 2.356cm^2 area and 0.707cm^3 volume. The wound is limited to skin breakdown. There is no tunneling noted. There is a small amount of serous drainage noted. The wound margin is flat and intact. There is small (1-33%) red granulation within the wound bed. There is a medium (34-66%) amount of necrotic tissue within the wound bed including Adherent Slough. The periwound skin appearance  exhibited: Scarring, Hemosiderin Staining. The periwound skin appearance did not exhibit: Callus, Crepitus, Excoriation, Fluctuance, Friable, Induration, Localized Edema, Rash, Dry/Scaly, Maceration, Moist, Atrophie Blanche, Cyanosis, Ecchymosis, Mottled, Pallor, Rubor, Erythema. [name], [name] ([id number]) Assessment Active Problems ICD-10 [phone] - Laceration without foreign body, left lower leg, initial encounter L97.222 - Non-pressure chronic ulcer of left calf with fat layer exposed F17.219 - Nicotine dependence, cigarettes, with unspecified nicotine-induced disorders I have recommended we change to medihoney and a nonadherent dressing over this and apply a local bordered foam dressing. She has been commended on giving up smoking and I have  asked her to continue the good work. Procedures Wound #1 Wound #1 is a Trauma, Other located on the Left,Medial Lower Leg . There was a Skin/Subcutaneous Tissue Debridement ([id number]) debridement with total area of 3 sq cm performed by Shelia Fudge, MD. with the following instrument(s): Forceps to remove Viable and Non-Viable tissue/material including Exudate, Fibrin/Slough, Eschar, Skin, and Subcutaneous after achieving pain control using Other (lidocaine 4%). A time out was conducted prior to the start of the procedure. A Minimum amount of bleeding was controlled with Pressure. The procedure was tolerated well with a pain level of 1 throughout and a pain level of 1 following the procedure. Post Debridement Measurements: 1cm length x 3cm width x 0.3cm depth; 0.707cm^3 volume. Post procedure Diagnosis Wound #1: Same as Pre-Procedure Plan Wound Cleansing: Wound #1 Left,Medial Lower Leg: Clean wound with Normal Saline. Anesthetic: Wound #1 Left,Medial Lower Leg: Topical Lidocaine 4% cream applied to wound bed prior to debridement Primary Wound Dressing: Wound #1 Left,Medial Lower Leg: Esquibel, Mikka A. ([id number]) Other: - Medihoney Secondary Dressing: Wound #1 Left,Medial Lower Leg: Conform/Kerlix Non-adherent pad Dressing Change Frequency: Wound #1 Left,Medial Lower Leg: Change dressing every other day. Follow-up Appointments: Wound #1 Left,Medial Lower Leg: Return Appointment in 1 week. Edema Control: Wound #1 Left,Medial Lower Leg: Elevate legs to the level of the heart and pump ankles as often as possible Additional Orders / Instructions: Wound #1 Left,Medial Lower Leg: Stop Smoking Increase protein intake. Activity as tolerated I have recommended we change to medihoney and a nonadherent dressing over this and apply a local bordered foam dressing. She has been commended on giving up smoking and I have asked her to continue the good work. I will see her back next  week. Electronic Signature(s) Signed: 12/17/2014 4:31:03 PM By: Shelia Fudge MD, FACS Entered By: [name] on 12/17/2014 16:31:03 Alvarado, Russell AMarland Kitchen ([id number]) -------------------------------------------------------------------------------- SuperBill Details Patient Name: [name], Alexiya A. Date of Service: 12/17/2014 Medical Record Number: [id number] Patient Account Number: [id number] Date of Birth/Sex: 23-Jan-1956 (59 y.o. Female) Treating [name]: [name] Primary Care Physician: [name] Other Clinician: Referring Physician: Elsie [name] Treating Physician/Extender: Frann Rider in Treatment: 1 Diagnosis Coding ICD-10 Codes Code Description [phone] Laceration without foreign body, left lower leg, initial encounter L97.222 Non-pressure chronic ulcer of left calf with fat layer exposed F17.219 Nicotine dependence, cigarettes, with unspecified nicotine-induced disorders Facility Procedures CPT4: Description Modifier Quantity Code [id number] 11042 - DEB SUBQ TISSUE 20 SQ CM/< 1 ICD-10 Description Diagnosis S81.812A Laceration without foreign body, left lower leg, initial encounter L97.222 Non-pressure chronic ulcer of left calf with fat layer  exposed F17.219 Nicotine dependence, cigarettes, with unspecified nicotine-induced disorders Physician Procedures CPT4: Description Modifier Quantity Code [id number] 11042 - WC PHYS SUBQ TISS 20 SQ CM 1 ICD-10 Description Diagnosis S81.812A Laceration without foreign body, left lower leg, initial encounter L97.222 Non-pressure chronic ulcer of left calf with fat layer  exposed  F17.219 Nicotine dependence, cigarettes, with unspecified nicotine-induced disorders Electronic Signature(s) Signed: 12/17/2014 4:31:13 PM By: Shelia Fudge MD, FACS Entered By: [name] on 12/17/2014 16:31:13

## 2014-12-18 NOTE — Progress Notes (Signed)
[name] ([id number]) Visit Report for 12/17/2014 Arrival Information Details Patient Name: [name], [name]. Date of Service: 12/17/2014 3:30 PM Medical Record Number: [id number] Patient Account Number: [id number] Date of Birth/Sex: Dec 02, 1955 (59 y.o. Female) Treating RN: [name] Primary Care Physician: [name] Other Clinician: Referring Physician: Elsie [name] Treating Physician/Extender: [name] in Treatment: 1 Visit Information History Since Last Visit Added or deleted any medications: No Patient Arrived: Ambulatory Any new allergies or adverse reactions: No Arrival Time: 15:55 Had [name] fall or experienced change in No Accompanied By: friend activities of daily living that may affect Transfer Assistance: None risk of falls: Patient Identification Verified: Yes Signs or symptoms of abuse/neglect since last No Secondary Verification Process Yes visito Completed: Hospitalized since last visit: No Patient Has Alerts: Yes Has Dressing in Place as Prescribed: Yes Pain Present Now: No Electronic Signature(s) Signed: 12/17/2014 5:21:15 PM By: Shelia Cool, RN, BSN, Kim RN, BSN Entered By: Shelia Cool, RN, BSN, Kim on 12/17/2014 15:55:50 [name], [name] ([id number]) -------------------------------------------------------------------------------- Encounter Discharge Information Details Patient Name: [name], [name]. Date of Service: 12/17/2014 3:30 PM Medical Record Number: [id number] Patient Account Number: [id number] Date of Birth/Sex: April 11, 1955 (59 y.o. Female) Treating RN: [name] Primary Care Physician: [name] Other Clinician: Referring Physician: Elsie [name] Treating Physician/Extender: [name] in Treatment: 1 Encounter Discharge Information Items Schedule Follow-up Appointment: No Medication Reconciliation completed and provided to Patient/Care No [place]wood Quezada: Provided on Clinical Summary of  Care: 12/17/2014 Form Type Recipient Paper Patient CM Electronic Signature(s) Signed: 12/17/2014 4:33:11 PM By: [name] Entered By: [name] on 12/17/2014 16:33:11 [name], [name] ([id number]) -------------------------------------------------------------------------------- Lower Extremity Assessment Details Patient Name: [name], [name]. Date of Service: 12/17/2014 3:30 PM Medical Record Number: [id number] Patient Account Number: [id number] Date of Birth/Sex: 1955-06-19 (59 y.o. Female) Treating RN: [name] Primary Care Physician: [name] Other Clinician: Referring Physician: Elsie [name] Treating Physician/Extender: [name] in Treatment: 1 Vascular Assessment Pulses: Posterior Tibial Dorsalis Pedis Palpable: [Left:Yes] Extremity colors, hair growth, and conditions: Extremity Color: [Left:Normal] Hair Growth on Extremity: [Left:Yes] Temperature of Extremity: [Left:Warm] Capillary Refill: [Left:< 3 seconds] Toe Nail Assessment Left: Right: Thick: No Discolored: No Deformed: No Improper Length and Hygiene: No Electronic Signature(s) Signed: 12/17/2014 5:21:15 PM By: Shelia Cool, RN, BSN, Kim RN, BSN Entered By: Shelia Cool, RN, BSN, Kim on 12/17/2014 15:57:42 Going, [name] AMarland Kitchen ([id number]) -------------------------------------------------------------------------------- Multi Wound Chart Details Patient Name: [name], [name]. Date of Service: 12/17/2014 3:30 PM Medical Record Number: [id number] Patient Account Number: [id number] Date of Birth/Sex: [birth year] (59 y.o. Female) Treating RN: [name] Primary Care Physician: [name] Other Clinician: Referring Physician: Elsie [name] Treating Physician/Extender: [name] in Treatment: 1 Vital Signs Height(in): 67 Pulse(bpm): 92 Weight(lbs): 190 Blood Pressure 117/95 (mmHg): Body Mass Index(BMI): 30 Temperature(F): 97.6 Respiratory  Rate 18 (breaths/min): Photos: [1:No Photos] [N/[name]:N/[name]] Wound Location: [1:Left Lower Leg - Medial] [N/[name]:N/[name]] Wounding Event: [1:Trauma] [N/[name]:N/[name]] Primary Etiology: [1:Trauma, Other] [N/[name]:N/[name]] Comorbid History: [1:Seizure Disorder] [N/[name]:N/[name]] Date Acquired: [1:11/09/2014] [N/[name]:N/[name]] Weeks of Treatment: [1:1] [N/[name]:N/[name]] Wound Status: [1:Open] [N/[name]:N/[name]] Measurements L x W x D 1x3x0.3 [N/[name]:N/[name]] (cm) Area (cm) : [1:2.356] [N/[name]:N/[name]] Volume (cm) : [1:0.707] [N/[name]:N/[name]] % Reduction in Area: [1:14.80%] [N/[name]:N/[name]] % Reduction in Volume: -27.80% [N/[name]:N/[name]] Classification: [1:Full Thickness Without Exposed Support Structures] [N/[name]:N/[name]] Exudate Amount: [1:Small] [N/[name]:N/[name]] Exudate Type: [1:Serous] [N/[name]:N/[name]] Exudate Color: [1:amber] [N/[name]:N/[name]] Wound Margin: [1:Flat and Intact] [N/[name]:N/[name]] Granulation Amount: [1:Small (1-33%)] [N/[name]:N/[name]] Granulation Quality: [1:Red] [N/[name]:N/[name]] Necrotic Amount: [1:Medium (34-66%)] [N/[name]:N/[name]] Exposed Structures: [1:Fascia: No Fat: No Tendon:  No Muscle: No Joint: No Bone: No] [N/[name]:N/[name]] Limited to Skin Breakdown Periwound Skin Texture: Scarring: Yes N/[name] N/[name] Edema: No Excoriation: No Induration: No Callus: No Crepitus: No Fluctuance: No Friable: No Rash: No Periwound Skin Maceration: No N/[name] N/[name] Moisture: Moist: No Dry/Scaly: No Periwound Skin Color: Hemosiderin Staining: Yes N/[name] N/[name] Atrophie Blanche: No Cyanosis: No Ecchymosis: No Erythema: No Mottled: No Pallor: No Rubor: No Tenderness on No N/[name] N/[name] Palpation: Wound Preparation: Ulcer Cleansing: N/[name] N/[name] Rinsed/Irrigated with Saline Topical Anesthetic Applied: Other: lidociane 4% Treatment Notes Electronic Signature(s) Signed: 12/17/2014 5:21:15 PM By: Shelia Cool, RN, BSN, Kim RN, BSN Entered By: Shelia Cool, RN, BSN, Kim on 12/17/2014 16:00:11 [name] ([id number]) -------------------------------------------------------------------------------- Multi-Disciplinary Care Plan Details Patient Name:  [name], [name]. Date of Service: 12/17/2014 3:30 PM Medical Record Number: [id number] Patient Account Number: [id number] Date of Birth/Sex: March 10, 1955 (59 y.o. Female) Treating RN: [name] Primary Care Physician: [name] Other Clinician: Referring Physician: Elsie [name] Treating Physician/Extender: [name] in Treatment: 1 Active Inactive Orientation to the Wound Care Program Nursing Diagnoses: Knowledge deficit related to the wound healing center program Goals: Patient/caregiver will verbalize understanding of the [facility] Date Initiated: 12/10/2014 Goal Status: Active Interventions: Provide education on orientation to the wound center Notes: Wound/Skin Impairment Nursing Diagnoses: Impaired tissue integrity Goals: Ulcer/skin breakdown will heal within 14 weeks Date Initiated: 12/10/2014 Goal Status: Active Interventions: Assess patient/caregiver ability to obtain necessary supplies Treatment Activities: Skin care regimen initiated : 12/17/2014 Notes: Electronic Signature(s) Signed: 12/17/2014 5:21:15 PM By: Shelia Cool, RN, BSN, Kim RN, BSN Entered By: Shelia Cool, RN, BSN, Kim on 12/17/2014 16:00:00 [name] ([id number]) Kurkowski, Melvia [name]. ([id number]) -------------------------------------------------------------------------------- Pain Assessment Details Patient Name: [name], [name]. Date of Service: 12/17/2014 3:30 PM Medical Record Number: [id number] Patient Account Number: [id number] Date of Birth/Sex: [birth year] (59 y.o. Female) Treating RN: [name] Primary Care Physician: [name] Other Clinician: Referring Physician: Elsie [name] Treating Physician/Extender: [name] in Treatment: 1 Active Problems Location of Pain Severity and Description of Pain Patient Has Paino No Site Locations Pain Management and Medication Current Pain Management: Electronic Signature(s) Signed:  12/17/2014 5:21:15 PM By: Shelia Cool, RN, BSN, Kim RN, BSN Entered By: Shelia Cool, RN, BSN, Kim on 12/17/2014 15:55:55 [place], Jaeda AMarland Kitchen ([id number]) -------------------------------------------------------------------------------- Wound Assessment Details Patient Name: [name], [name]. Date of Service: 12/17/2014 3:30 PM Medical Record Number: [id number] Patient Account Number: [id number] Date of Birth/Sex: 1955/04/09 (59 y.o. Female) Treating RN: [name] Primary Care Physician: [name] Other Clinician: Referring Physician: Elsie [name] Treating Physician/Extender: [name] in Treatment: 1 Wound Status Wound Number: 1 Primary Etiology: Trauma, Other Wound Location: Left Lower Leg - Medial Wound Status: Open Wounding Event: Trauma Comorbid History: Seizure Disorder Date Acquired: 11/09/2014 Weeks Of Treatment: 1 Clustered Wound: No Photos Photo Uploaded By: Shelia Cool, RN, BSN, Kim on 12/17/2014 17:51:49 Wound Measurements Length: (cm) 1 Width: (cm) 3 Depth: (cm) 0.3 Area: (cm) 2.356 Volume: (cm) 0.707 % Reduction in Area: 14.8% % Reduction in Volume: -27.8% Tunneling: No Wound Description Full Thickness Without Exposed Classification: Support Structures Wound Margin: Flat and Intact Exudate Small Amount: Exudate Type: Serous Exudate Color: amber Wound Bed Granulation Amount: Small (1-33%) Exposed Structure Granulation Quality: Red Fascia Exposed: No Necrotic Amount: Medium (34-66%) Fat Layer Exposed: No [place], Lasonya [name]. ([id number]) Necrotic Quality: Adherent Slough Tendon Exposed: No Muscle Exposed: No Joint Exposed: No Bone Exposed: No Limited to Skin Breakdown Periwound Skin Texture Texture Color No Abnormalities Noted: No No Abnormalities Noted:  No Callus: No Atrophie Blanche: No Crepitus: No Cyanosis: No Excoriation: No Ecchymosis: No Fluctuance: No Erythema: No Friable: No Hemosiderin Staining: Yes Induration:  No Mottled: No Localized Edema: No Pallor: No Rash: No Rubor: No Scarring: Yes Moisture No Abnormalities Noted: No Dry / Scaly: No Maceration: No Moist: No Wound Preparation Ulcer Cleansing: Rinsed/Irrigated with Saline Topical Anesthetic Applied: Other: lidociane 4%, Treatment Notes Wound #1 (Left, Medial Lower Leg) 1. Cleansed with: Clean wound with Normal Saline 2. Anesthetic Topical Lidocaine 4% cream to wound bed prior to debridement 4. Dressing Applied: Medihoney Gel 5. Secondary Dressing Applied Bordered Foam Dressing Non-Adherent pad Notes stretch net Electronic Signature(s) Signed: 12/17/2014 5:21:15 PM By: Shelia Cool, RN, BSN, Kim RN, BSN Entered By: Shelia Cool, RN, BSN, Kim on 12/17/2014 15:59:48 [place], Mera AMarland Kitchen ([id number]) Stokes, Pranathi Loni Muse ([id number]) -------------------------------------------------------------------------------- Vitals Details Patient Name: [name], [name]. Date of Service: 12/17/2014 3:30 PM Medical Record Number: [id number] Patient Account Number: [id number] Date of Birth/Sex: 1955-12-03 (59 y.o. Female) Treating RN: [name] Primary Care Physician: [name] Other Clinician: Referring Physician: Elsie [name] Treating Physician/Extender: [name] in Treatment: 1 Vital Signs Time Taken: 15:56 Temperature (F): 97.6 Height (in): 67 Pulse (bpm): 92 Weight (lbs): 190 Respiratory Rate (breaths/min): 18 Body Mass Index (BMI): 29.8 Blood Pressure (mmHg): 117/95 Reference Range: 80 - 120 mg / dl Electronic Signature(s) Signed: 12/17/2014 5:21:15 PM By: Shelia Cool, RN, BSN, Kim RN, BSN Entered By: Shelia Cool, RN, BSN, Kim on 12/17/2014 15:56:33

## 2014-12-24 ENCOUNTER — Encounter: Payer: BLUE CROSS/BLUE SHIELD | Admitting: Surgery

## 2014-12-24 ENCOUNTER — Ambulatory Visit: Payer: BLUE CROSS/BLUE SHIELD | Admitting: Surgery

## 2014-12-24 DIAGNOSIS — L97222 Non-pressure chronic ulcer of left calf with fat layer exposed: Secondary | ICD-10-CM | POA: Diagnosis not present

## 2014-12-24 NOTE — Progress Notes (Addendum)
Shelia, [name] ([id number]) Visit Report for 12/24/2014 Chief Complaint Document Details Patient Name: Shelia, [name] 12/24/2014 9:30 Date of Service: AM Medical Record [id number] Number: Patient Account Number: [id number] 06-10-1955 (59 y.o. Treating RN: Date of Birth/Sex: Female) Other Clinician: Primary Care Physician: Santiago Bur Referring Physician: [name] Physician/Extender: Weeks in Treatment: 2 Information Obtained from: Patient Chief Complaint Patient presents to the wound care center for a consult due non healing wound. she has had a wound on her left lower extremity for about a month Electronic Signature(s) Signed: 12/24/2014 10:20:48 AM By: Christin Fudge MD, FACS Entered By: Christin Fudge on 12/24/2014 10:20:48 [name] ([id number]) -------------------------------------------------------------------------------- Debridement Details Patient Name: Shelia Lick A. 12/24/2014 9:30 Date of Service: AM Medical Record [id number] Number: Patient Account Number: [id number] Mar 09, 1955 (59 y.o. Treating RN: Date of Birth/Sex: Female) Other Clinician: Primary Care Physician: Brain Hilts, Usama Harkless Referring Physician: [name] Physician/Extender: Weeks in Treatment: 2 Debridement Performed for Wound #1 Left,Medial Lower Leg Assessment: Performed By: Physician Christin Fudge, MD Debridement: Debridement Pre-procedure Yes Verification/Time Out Taken: Start Time: 10:14 Pain Control: Other : lidocaine 4% Level: Skin/Subcutaneous Tissue Total Area Debrided (L x 0.6 (cm) x 2.2 (cm) = 1.32 (cm) W): Tissue and other Viable, Non-Viable, Exudate, Fibrin/Slough, Subcutaneous material debrided: Instrument: Curette Bleeding: Minimum Hemostasis Achieved: Pressure End Time: 10:20 Procedural Pain: 2 Post Procedural Pain: 1 Response to Treatment: Procedure was tolerated well Post  Debridement Measurements of Total Wound Length: (cm) 0.6 Width: (cm) 2.4 Depth: (cm) 0.3 Volume: (cm) 0.339 Post Procedure Diagnosis Same as Pre-procedure Electronic Signature(s) Signed: 12/24/2014 10:20:42 AM By: Christin Fudge MD, FACS Entered By: Christin Fudge on 12/24/2014 10:20:42 [name] ([id number]) -------------------------------------------------------------------------------- HPI Details Patient Name: Shelia Lick A. 12/24/2014 9:30 Date of Service: AM Medical Record [id number] Number: Patient Account Number: [id number] Feb 05, 1956 (59 y.o. Treating RN: Date of Birth/Sex: Female) Other Clinician: Primary Care Physician: Santiago Bur Referring Physician: [name] Physician/Extender: Weeks in Treatment: 2 History of Present Illness Location: left lower extremity Quality: Patient reports experiencing a dull pain to affected area(s). Severity: Patient states wound are getting worse. Duration: Patient has had the wound for < 4 weeks prior to presenting for treatment Timing: Pain in wound is Intermittent (comes and goes Context: The wound occurred when the patient had a fall and lacerated her left lower extremity about a month ago. She was sutured in the ER and sutures were removed in 15 days. Modifying Factors: Other treatment(s) tried include: he has been on doxycycline and clindamycin and has been applying local anti-biotic ointment over the wound Associated Signs and Symptoms: Patient reports having difficulty standing for long periods. HPI Description: this 59 year old patient had a lacerated wound to the left lower extremity about a month ago. This was sutured in the ER and the sutures were removed in about 2 weeks' time. The wound then got infected and gaped. She was seen in the urgent care a couple of days ago with an infection and was put on doxycycline and clindamycin. She has been a smoker for several years  and smokes about 10 cigarettes a day. Does not have diabetes mellitus. 12/17/2014 -- it has been a few days since she is given of smoking completely and I have commended her on this. Electronic Signature(s) Signed: 12/24/2014 10:20:53 AM By: Christin Fudge MD, FACS Entered By: Christin Fudge on 12/24/2014 10:20:53 [name] ([id number]) -------------------------------------------------------------------------------- Physical Exam Details Patient Name: [name], [name] 12/24/2014 9:30  Date of Service: AM Medical Record [id number] Number: Patient Account Number: [id number] 11-04-1955 (59 y.o. Treating RN: Date of Birth/Sex: Female) Other Clinician: Primary Care Physician: Santiago Bur Referring Physician: [name] Physician/Extender: Weeks in Treatment: 2 Constitutional . Pulse regular. Respirations normal and unlabored. Afebrile. . Eyes Nonicteric. Reactive to light. Ears, Nose, Mouth, and Throat Lips, teeth, and gums WNL.Marland Kitchen Moist mucosa without lesions . Neck supple and nontender. No palpable supraclavicular or cervical adenopathy. Normal sized without goiter. Respiratory WNL. No retractions.. Cardiovascular Pedal Pulses WNL. No clubbing, cyanosis or edema. Chest Breasts symmetical and no nipple discharge.. Breast tissue WNL, no masses, lumps, or tenderness.. Lymphatic No adneopathy. No adenopathy. No adenopathy. Musculoskeletal Adexa without tenderness or enlargement.. Digits and nails w/o clubbing, cyanosis, infection, petechiae, ischemia, or inflammatory conditions.. Integumentary (Hair, Skin) No suspicious lesions. No crepitus or fluctuance. No peri-wound warmth or erythema. No masses.Marland Kitchen Psychiatric Judgement and insight Intact.. No evidence of depression, anxiety, or agitation.. Notes the wound continues to look better but relates some sharp debridement with a curette and once this is done the base of the ulcer is  granulating. Electronic Signature(s) Signed: 12/24/2014 10:21:21 AM By: Christin Fudge MD, FACS Entered By: Christin Fudge on 12/24/2014 10:21:21 [name] ([id number]) -------------------------------------------------------------------------------- Physician Orders Details Patient Name: Shelia Lick A. 12/24/2014 9:30 Date of Service: AM Medical Record [id number] Number: Patient Account Number: [id number] 08/23/1955 (59 y.o. Treating RN: Cornell Barman Date of Birth/Sex: Female) Other Clinician: Primary Care Physician: Brain Hilts, Savior Himebaugh Referring Physician: [name] Physician/Extender: Suella Grove in Treatment: 2 Verbal / Phone Orders: Yes Clinician: Cornell Barman Read Back and Verified: Yes Diagnosis Coding ICD-10 Coding Code Description [phone] Laceration without foreign body, left lower leg, initial encounter L97.222 Non-pressure chronic ulcer of left calf with fat layer exposed F17.219 Nicotine dependence, cigarettes, with unspecified nicotine-induced disorders Wound Cleansing Wound #1 Left,Medial Lower Leg o Clean wound with Normal Saline. Anesthetic Wound #1 Left,Medial Lower Leg o Topical Lidocaine 4% cream applied to wound bed prior to debridement Primary Wound Dressing Wound #1 Left,Medial Lower Leg o Other: - Medihoney Secondary Dressing Wound #1 Left,Medial Lower Leg o Boardered Foam Dressing o Non-adherent pad Dressing Change Frequency Wound #1 Left,Medial Lower Leg o Change dressing every other day. Follow-up Appointments Wound #1 Left,Medial Lower Leg o Return Appointment in 1 week. Edema Control Wound #1 Left,Medial Lower Leg Ryle, Lydiana A. ([id number]) o Elevate legs to the level of the heart and pump ankles as often as possible Additional Orders / Instructions Wound #1 Left,Medial Lower Leg o Stop Smoking o Increase protein intake. o Activity as tolerated Electronic  Signature(s) Signed: 12/24/2014 4:16:00 PM By: Christin Fudge MD, FACS Signed: 12/24/2014 5:34:28 PM By: Gretta Cool RN, BSN, Kim RN, BSN Entered By: Gretta Cool, RN, BSN, Kim on 12/24/2014 10:28:03 [name] ([id number]) -------------------------------------------------------------------------------- Problem List Details Patient Name: Shelia, STAUFFER A. 12/24/2014 9:30 Date of Service: AM Medical Record [id number] Number: Patient Account Number: [id number] [birth year] (59 y.o. Treating RN: Date of Birth/Sex: Female) Other Clinician: Primary Care Physician: Santiago Bur Referring Physician: [name] Physician/Extender: Weeks in Treatment: 2 Active Problems ICD-10 Encounter Code Description Active Date Diagnosis S81.812A Laceration without foreign body, left lower leg, initial 12/10/2014 Yes encounter L97.222 Non-pressure chronic ulcer of left calf with fat layer 12/10/2014 Yes exposed F17.219 Nicotine dependence, cigarettes, with unspecified 12/10/2014 Yes nicotine-induced disorders Inactive Problems Resolved Problems Electronic Signature(s) Signed: 12/24/2014 10:20:32 AM By: Christin Fudge MD, FACS Entered By: Christin Fudge on 12/24/2014  10:20:32 JAMIKA, SADEK ([id number]) -------------------------------------------------------------------------------- Progress Note Details Patient Name: Shelia, NEISES A. 12/24/2014 9:30 Date of Service: AM Medical Record [id number] Number: Patient Account Number: [id number] June 04, 1955 (59 y.o. Treating RN: Date of Birth/Sex: Female) Other Clinician: Primary Care Physician: Santiago Bur Referring Physician: [name] Physician/Extender: Weeks in Treatment: 2 Subjective Chief Complaint Information obtained from Patient Patient presents to the wound care center for a consult due non healing wound. she has had a wound on her left lower extremity for about a  month History of Present Illness (HPI) The following HPI elements were documented for the patient's wound: Location: left lower extremity Quality: Patient reports experiencing a dull pain to affected area(s). Severity: Patient states wound are getting worse. Duration: Patient has had the wound for < 4 weeks prior to presenting for treatment Timing: Pain in wound is Intermittent (comes and goes Context: The wound occurred when the patient had a fall and lacerated her left lower extremity about a month ago. She was sutured in the ER and sutures were removed in 15 days. Modifying Factors: Other treatment(s) tried include: he has been on doxycycline and clindamycin and has been applying local anti-biotic ointment over the wound Associated Signs and Symptoms: Patient reports having difficulty standing for long periods. this 59 year old patient had a lacerated wound to the left lower extremity about a month ago. This was sutured in the ER and the sutures were removed in about 2 weeks' time. The wound then got infected and gaped. She was seen in the urgent care a couple of days ago with an infection and was put on doxycycline and clindamycin. She has been a smoker for several years and smokes about 10 cigarettes a day. Does not have diabetes mellitus. 12/17/2014 -- it has been a few days since she is given of smoking completely and I have commended her on this. Objective [name], [name] ([id number]) Constitutional Pulse regular. Respirations normal and unlabored. Afebrile. Vitals Time Taken: 10:04 AM, Height: 67 in, Weight: 190 lbs, BMI: 29.8, Temperature: 97.9 F, Pulse: 81 bpm, Respiratory Rate: 18 breaths/min, Blood Pressure: 108/93 mmHg. Eyes Nonicteric. Reactive to light. Ears, Nose, Mouth, and Throat Lips, teeth, and gums WNL.Marland Kitchen Moist mucosa without lesions . Neck supple and nontender. No palpable supraclavicular or cervical adenopathy. Normal sized without  goiter. Respiratory WNL. No retractions.. Cardiovascular Pedal Pulses WNL. No clubbing, cyanosis or edema. Chest Breasts symmetical and no nipple discharge.. Breast tissue WNL, no masses, lumps, or tenderness.. Lymphatic No adneopathy. No adenopathy. No adenopathy. Musculoskeletal Adexa without tenderness or enlargement.. Digits and nails w/o clubbing, cyanosis, infection, petechiae, ischemia, or inflammatory conditions.Marland Kitchen Psychiatric Judgement and insight Intact.. No evidence of depression, anxiety, or agitation.. General Notes: the wound continues to look better but relates some sharp debridement with a curette and once this is done the base of the ulcer is granulating. Integumentary (Hair, Skin) No suspicious lesions. No crepitus or fluctuance. No peri-wound warmth or erythema. No masses.. Wound #1 status is Open. Original cause of wound was Trauma. The wound is located on the Left,Medial Lower Leg. The wound measures 0.6cm length x 2.2cm width x 0.3cm depth; 1.037cm^2 area and 0.311cm^3 volume. The wound is limited to skin breakdown. There is a small amount of serous drainage noted. The wound margin is flat and intact. There is small (1-33%) red granulation within the wound bed. There is a medium (34-66%) amount of necrotic tissue within the wound bed including Adherent Slough. The periwound skin appearance exhibited: Scarring, Hemosiderin  Staining, Erythema. The periwound skin appearance did not exhibit: Callus, Crepitus, Excoriation, Fluctuance, Friable, Induration, Localized Edema, Rash, Dry/Scaly, Maceration, Moist, Atrophie Blanche, Cyanosis, Ecchymosis, Mottled, Pallor, Rubor. [facility], [name] ([id number]) surrounding wound skin color is noted with erythema which is circumferential. Periwound temperature was noted as No Abnormality. The periwound has tenderness on palpation. Assessment Active Problems ICD-10 S81.812A - Laceration without foreign body, left lower leg,  initial encounter L97.222 - Non-pressure chronic ulcer of left calf with fat layer exposed F17.219 - Nicotine dependence, cigarettes, with unspecified nicotine-induced disorders I have recommended we continue with medihoney and a nonadherent dressing over this and apply a local bordered foam dressing. She has been commended on giving up smoking and I have asked her to continue the good work. I will see her back next week. Procedures Wound #1 Wound #1 is a Trauma, Other located on the Left,Medial Lower Leg . There was a Skin/Subcutaneous Tissue Debridement ([id number]) debridement with total area of 1.32 sq cm performed by Christin Fudge, MD. with the following instrument(s): Curette to remove Viable and Non-Viable tissue/material including Exudate, Fibrin/Slough, and Subcutaneous after achieving pain control using Other (lidocaine 4%). A time out was conducted prior to the start of the procedure. A Minimum amount of bleeding was controlled with Pressure. The procedure was tolerated well with a pain level of 2 throughout and a pain level of 1 following the procedure. Post Debridement Measurements: 0.6cm length x 2.4cm width x 0.3cm depth; 0.339cm^3 volume. Post procedure Diagnosis Wound #1: Same as Pre-Procedure Plan [name], [name] ([id number]) Wound Cleansing: Wound #1 Left,Medial Lower Leg: Clean wound with Normal Saline. Anesthetic: Wound #1 Left,Medial Lower Leg: Topical Lidocaine 4% cream applied to wound bed prior to debridement Primary Wound Dressing: Wound #1 Left,Medial Lower Leg: Other: - Medihoney Secondary Dressing: Wound #1 Left,Medial Lower Leg: Boardered Foam Dressing Non-adherent pad Dressing Change Frequency: Wound #1 Left,Medial Lower Leg: Change dressing every other day. Follow-up Appointments: Wound #1 Left,Medial Lower Leg: Return Appointment in 1 week. Edema Control: Wound #1 Left,Medial Lower Leg: Elevate legs to the level of the heart and pump  ankles as often as possible Additional Orders / Instructions: Wound #1 Left,Medial Lower Leg: Stop Smoking Increase protein intake. Activity as tolerated I have recommended we continue with medihoney and a nonadherent dressing over this and apply a local bordered foam dressing. She has been commended on giving up smoking and I have asked her to continue the good work. I will see her back next week. Electronic Signature(s) Signed: 12/24/2014 4:16:50 PM By: Christin Fudge MD, FACS Previous Signature: 12/24/2014 10:22:00 AM Version By: Christin Fudge MD, FACS Entered By: Christin Fudge on 12/24/2014 16:16:50 [name], [name] ([id number]) -------------------------------------------------------------------------------- SuperBill Details Patient Name: [name], [name] Date of Service: 12/24/2014 Medical Record Number: [id number] Patient Account Number: [id number] Date of Birth/Sex: [birth year] (59 y.o. Female) Treating RN: Primary Care Physician: [name] Other Clinician: Referring Physician: [name] Treating Physician/Extender: Frann Rider in Treatment: 2 Diagnosis Coding ICD-10 Codes Code Description [phone] Laceration without foreign body, left lower leg, initial encounter L97.222 Non-pressure chronic ulcer of left calf with fat layer exposed F17.219 Nicotine dependence, cigarettes, with unspecified nicotine-induced disorders Facility Procedures CPT4: Description Modifier Quantity Code [id number] 11042 - DEB SUBQ TISSUE 20 SQ CM/< 1 ICD-10 Description Diagnosis S81.812A Laceration without foreign body, left lower leg, initial encounter L97.222 Non-pressure chronic ulcer of left calf with fat layer  exposed F17.219 Nicotine dependence, cigarettes, with unspecified nicotine-induced disorders Physician Procedures CPT4: Description Modifier  Quantity Code [id number] 11042 - WC PHYS SUBQ TISS 20 SQ CM 1 ICD-10 Description Diagnosis S81.812A Laceration without  foreign body, left lower leg, initial encounter L97.222 Non-pressure chronic ulcer of left calf with fat layer  exposed F17.219 Nicotine dependence, cigarettes, with unspecified nicotine-induced disorders Electronic Signature(s) Signed: 12/24/2014 10:22:11 AM By: Christin Fudge MD, FACS Entered By: Christin Fudge on 12/24/2014 10:22:11

## 2014-12-25 NOTE — Progress Notes (Signed)
[name] ([id number]) Visit Report for 12/24/2014 Arrival Information Details Patient Name: [name], [name] Date of Service: 12/24/2014 9:30 AM Medical Record Number: [id number] Patient Account Number: [id number] Date of Birth/Sex: [birth year] (59 y.o. Female) Treating RN: Cornell Barman Primary Care Physician: [name] Other Clinician: Referring Physician: [name] Treating Physician/Extender: Frann Rider in Treatment: 2 Visit Information History Since Last Visit Added or deleted any medications: No Patient Arrived: Ambulatory Any new allergies or adverse reactions: No Arrival Time: 10:04 Had a fall or experienced change in No Accompanied By: self activities of daily living that may affect Transfer Assistance: None risk of falls: Patient Identification Verified: Yes Signs or symptoms of abuse/neglect since last No Secondary Verification Process Yes visito Completed: Hospitalized since last visit: No Patient Has Alerts: Yes Has Dressing in Place as Prescribed: Yes Pain Present Now: No Electronic Signature(s) Signed: 12/24/2014 5:34:28 PM By: Gretta Cool, RN, BSN, Kim RN, BSN Entered By: Gretta Cool, RN, BSN, Kim on 12/24/2014 10:04:25 [name] ([id number]) -------------------------------------------------------------------------------- Encounter Discharge Information Details Patient Name: [name], [name] Date of Service: 12/24/2014 9:30 AM Medical Record Number: [id number] Patient Account Number: [id number] Date of Birth/Sex: 1955-10-14 (59 y.o. Female) Treating RN: Primary Care Physician: [name] Other Clinician: Referring Physician: [name] Treating Physician/Extender: Frann Rider in Treatment: 2 Encounter Discharge Information Items Discharge Pain Level: 0 Discharge Condition: Stable Ambulatory Status: Ambulatory Discharge Destination: Home Transportation: Private Auto Accompanied By:  self Schedule Follow-up Appointment: Yes Medication Reconciliation completed and provided to Patient/Care Yes Rusell Meneely: Provided on Clinical Summary of Care: 12/24/2014 Form Type Recipient Paper Patient CM Electronic Signature(s) Signed: 12/24/2014 5:34:28 PM By: Gretta Cool RN, BSN, Kim RN, BSN Previous Signature: 12/24/2014 10:26:00 AM Version By: Ruthine Dose Entered By: Gretta Cool RN, BSN, Kim on 12/24/2014 10:29:12 [name] ([id number]) -------------------------------------------------------------------------------- Lower Extremity Assessment Details Patient Name: [name], Quinlan A. Date of Service: 12/24/2014 9:30 AM Medical Record Number: [id number] Patient Account Number: [id number] Date of Birth/Sex: [birth year] (59 y.o. Female) Treating RN: Cornell Barman Primary Care Physician: [name] Other Clinician: Referring Physician: [name] Treating Physician/Extender: Frann Rider in Treatment: 2 Vascular Assessment Pulses: Posterior Tibial Dorsalis Pedis Palpable: [Left:Yes] Extremity colors, hair growth, and conditions: Extremity Color: [Left:Mottled] Hair Growth on Extremity: [Left:Yes] Temperature of Extremity: [Left:Warm] Capillary Refill: [Left:< 3 seconds] Toe Nail Assessment Left: Right: Thick: No Discolored: No Deformed: No Improper Length and Hygiene: No Electronic Signature(s) Signed: 12/24/2014 5:34:28 PM By: Gretta Cool, RN, BSN, Kim RN, BSN Entered By: Gretta Cool, RN, BSN, Kim on 12/24/2014 10:07:41 Uzelac, Sitlali AMarland Kitchen ([id number]) -------------------------------------------------------------------------------- Multi Wound Chart Details Patient Name: [name], [name] Date of Service: 12/24/2014 9:30 AM Medical Record Number: [id number] Patient Account Number: [id number] Date of Birth/Sex: 12/14/1955 (59 y.o. Female) Treating RN: Cornell Barman Primary Care Physician: [name] Other Clinician: Referring Physician: [name] Treating Physician/Extender: Frann Rider in Treatment: 2 Vital Signs Height(in): 67 Pulse(bpm): 81 Weight(lbs): 190 Blood Pressure 108/93 (mmHg): Body Mass Index(BMI): 30 Temperature(F): 97.9 Respiratory Rate 18 (breaths/min): Photos: [1:No Photos] [N/A:N/A] Wound Location: [1:Left Lower Leg - Medial] [N/A:N/A] Wounding Event: [1:Trauma] [N/A:N/A] Primary Etiology: [1:Trauma, Other] [N/A:N/A] Comorbid History: [1:Seizure Disorder] [N/A:N/A] Date Acquired: [1:11/09/2014] [N/A:N/A] Weeks of Treatment: [1:2] [N/A:N/A] Wound Status: [1:Open] [N/A:N/A] Measurements L x W x D 0.6x2.2x0.3 [N/A:N/A] (cm) Area (cm) : [1:1.037] [N/A:N/A] Volume (cm) : [1:0.311] [N/A:N/A] % Reduction in Area: [1:62.50%] [N/A:N/A] % Reduction in Volume: 43.80% [N/A:N/A] Classification: [1:Full Thickness Without Exposed Support Structures] [N/A:N/A] Exudate Amount: [1:Small] [N/A:N/A] Exudate Type: [1:Serous] [  N/A:N/A] Exudate Color: [1:amber] [N/A:N/A] Wound Margin: [1:Flat and Intact] [N/A:N/A] Granulation Amount: [1:Small (1-33%)] [N/A:N/A] Granulation Quality: [1:Red] [N/A:N/A] Necrotic Amount: [1:Medium (34-66%)] [N/A:N/A] Exposed Structures: [1:Fascia: No Fat: No Tendon: No Muscle: No Joint: No Bone: No] [N/A:N/A] Limited to Skin Breakdown Periwound Skin Texture: Scarring: Yes N/A N/A Edema: No Excoriation: No Induration: No Callus: No Crepitus: No Fluctuance: No Friable: No Rash: No Periwound Skin Maceration: No N/A N/A Moisture: Moist: No Dry/Scaly: No Periwound Skin Color: Erythema: Yes N/A N/A Hemosiderin Staining: Yes Atrophie Blanche: No Cyanosis: No Ecchymosis: No Mottled: No Pallor: No Rubor: No Erythema Location: Circumferential N/A N/A Temperature: No Abnormality N/A N/A Tenderness on Yes N/A N/A Palpation: Wound Preparation: Ulcer Cleansing: N/A N/A Rinsed/Irrigated with Saline Topical Anesthetic Applied: Other: lidociane 4% Treatment  Notes Electronic Signature(s) Signed: 12/24/2014 5:34:28 PM By: Gretta Cool, RN, BSN, Kim RN, BSN Entered By: Gretta Cool, RN, BSN, Kim on 12/24/2014 10:15:43 [name] ([id number]) -------------------------------------------------------------------------------- [place] Details Patient Name: [name], Leena A. Date of Service: 12/24/2014 9:30 AM Medical Record Number: [id number] Patient Account Number: [id number] Date of Birth/Sex: 05/17/1955 (59 y.o. Female) Treating RN: Cornell Barman Primary Care Physician: [name] Other Clinician: Referring Physician: [name] Treating Physician/Extender: Frann Rider in Treatment: 2 Active Inactive Orientation to the Wound Care Program Nursing Diagnoses: Knowledge deficit related to the wound healing center program Goals: Patient/caregiver will verbalize understanding of the [place] Program Date Initiated: 12/10/2014 Goal Status: Active Interventions: Provide education on orientation to the wound center Notes: Wound/Skin Impairment Nursing Diagnoses: Impaired tissue integrity Goals: Ulcer/skin breakdown will heal within 14 weeks Date Initiated: 12/10/2014 Goal Status: Active Interventions: Assess patient/caregiver ability to obtain necessary supplies Treatment Activities: Skin care regimen initiated : 12/24/2014 Notes: Electronic Signature(s) Signed: 12/24/2014 5:34:28 PM By: Gretta Cool, RN, BSN, Kim RN, BSN Entered By: Gretta Cool, RN, BSN, Kim on 12/24/2014 10:15:32 Agro, [name] ([id number]) Gonzalez, [place] AMarland Kitchen ([id number]) -------------------------------------------------------------------------------- Pain Assessment Details Patient Name: [name], Jolisa A. Date of Service: 12/24/2014 9:30 AM Medical Record Number: [id number] Patient Account Number: [id number] Date of Birth/Sex: 1955/10/31 (59 y.o. Female) Treating RN: Cornell Barman Primary Care Physician: [name] Other Clinician: Referring Physician: [name] Treating Physician/Extender: Frann Rider in Treatment: 2 Active Problems Location of Pain Severity and Description of Pain Patient Has Paino No Site Locations Pain Management and Medication Current Pain Management: Electronic Signature(s) Signed: 12/24/2014 5:34:28 PM By: Gretta Cool, RN, BSN, Kim RN, BSN Entered By: Gretta Cool, RN, BSN, Kim on 12/24/2014 10:04:30 [name] ([id number]) -------------------------------------------------------------------------------- Patient/Caregiver Education Details Patient Name: [name], Rosibel A. Date of Service: 12/24/2014 9:30 AM Medical Record Number: [id number] Patient Account Number: [id number] Date of Birth/Gender: 23-Sep-1955 (59 y.o. Female) Treating RN: Cornell Barman Primary Care Physician: [name] Other Clinician: Referring Physician: [name] Treating Physician/Extender: Frann Rider in Treatment: 2 Education Assessment Education Provided To: Patient Education Topics Provided Wound/Skin Impairment: Handouts: Caring for Your Ulcer, Other: continue wound care as prescribed Methods: Demonstration Responses: State content correctly Electronic Signature(s) Signed: 12/24/2014 5:34:28 PM By: Gretta Cool, RN, BSN, Kim RN, BSN Entered By: Gretta Cool, RN, BSN, Kim on 12/24/2014 10:29:39 Frier, [name] ([id number]) -------------------------------------------------------------------------------- Wound Assessment Details Patient Name: [name], Kelena A. Date of Service: 12/24/2014 9:30 AM Medical Record Number: [id number] Patient Account Number: [id number] Date of Birth/Sex: 05-07-1955 (59 y.o. Female) Treating RN: Cornell Barman Primary Care Physician: [name] Other Clinician: Referring Physician: [name] Treating Physician/Extender: Frann Rider in Treatment: 2 Wound Status Wound Number: 1 Primary Etiology: Trauma,  Other Wound Location: Left Lower Leg - Medial Wound Status: Open Wounding Event: Trauma Comorbid History: Seizure Disorder Date Acquired: 11/09/2014 Weeks Of Treatment: 2 Clustered Wound: No Photos Photo Uploaded By: Gretta Cool, RN, BSN, Kim on 12/24/2014 11:14:43 Wound Measurements Length: (cm) 0.6 Width: (cm) 2.2 Depth: (cm) 0.3 Area: (cm) 1.037 Volume: (cm) 0.311 % Reduction in Area: 62.5% % Reduction in Volume: 43.8% Wound Description Full Thickness Without Exposed Classification: Support Structures Wound Margin: Flat and Intact Exudate Small Amount: Exudate Type: Serous Exudate Color: amber Wound Bed Granulation Amount: Small (1-33%) Exposed Structure Granulation Quality: Red Fascia Exposed: No Necrotic Amount: Medium (34-66%) Fat Layer Exposed: No Devora, Rosena A. ([id number]) Necrotic Quality: Adherent Slough Tendon Exposed: No Muscle Exposed: No Joint Exposed: No Bone Exposed: No Limited to Skin Breakdown Periwound Skin Texture Texture Color No Abnormalities Noted: No No Abnormalities Noted: No Callus: No Atrophie Blanche: No Crepitus: No Cyanosis: No Excoriation: No Ecchymosis: No Fluctuance: No Erythema: Yes Friable: No Erythema Location: Circumferential Induration: No Hemosiderin Staining: Yes Localized Edema: No Mottled: No Rash: No Pallor: No Scarring: Yes Rubor: No Moisture Temperature / Pain No Abnormalities Noted: No Temperature: No Abnormality Dry / Scaly: No Tenderness on Palpation: Yes Maceration: No Moist: No Wound Preparation Ulcer Cleansing: Rinsed/Irrigated with Saline Topical Anesthetic Applied: Other: lidociane 4%, Treatment Notes Wound #1 (Left, Medial Lower Leg) 1. Cleansed with: Clean wound with Normal Saline 2. Anesthetic Topical Lidocaine 4% cream to wound bed prior to debridement 4. Dressing Applied: Medihoney Gel 5. Secondary Dressing Applied Bordered Foam Dressing Notes stretch net Electronic  Signature(s) Signed: 12/24/2014 5:34:28 PM By: Gretta Cool, RN, BSN, Kim RN, BSN Entered By: Gretta Cool, RN, BSN, Kim on 12/24/2014 10:09:19 [name] ([id number]) -------------------------------------------------------------------------------- Vitals Details Patient Name: [name], Abagayle A. Date of Service: 12/24/2014 9:30 AM Medical Record Number: [id number] Patient Account Number: [id number] Date of Birth/Sex: Mar 20, 1955 (59 y.o. Female) Treating RN: Cornell Barman Primary Care Physician: [name] Other Clinician: Referring Physician: [name] Treating Physician/Extender: Frann Rider in Treatment: 2 Vital Signs Time Taken: 10:04 Temperature (F): 97.9 Height (in): 67 Pulse (bpm): 81 Weight (lbs): 190 Respiratory Rate (breaths/min): 18 Body Mass Index (BMI): 29.8 Blood Pressure (mmHg): 108/93 Reference Range: 80 - 120 mg / dl Electronic Signature(s) Signed: 12/24/2014 5:34:28 PM By: Gretta Cool, RN, BSN, Kim RN, BSN Entered By: Gretta Cool, RN, BSN, Kim on 12/24/2014 10:06:53

## 2014-12-30 ENCOUNTER — Encounter: Payer: BLUE CROSS/BLUE SHIELD | Admitting: Surgery

## 2014-12-30 DIAGNOSIS — L97222 Non-pressure chronic ulcer of left calf with fat layer exposed: Secondary | ICD-10-CM | POA: Diagnosis not present

## 2014-12-30 NOTE — Progress Notes (Signed)
IMBERLY, TROXLER ([id number]) Visit Report for 12/30/2014 Arrival Information Details Patient Name: ZOEIE, RITTER 12/30/2014 10:00 Date of Service: AM Medical Record [id number] Number: Patient Account Number: [id number] 1955/05/10 (59 y.o. Treating RN: Cornell Barman Date of Birth/Sex: Female) Other Clinician: Primary Care Physician: Brain Hilts, Errol Referring Physician: [name] Physician/Extender: Weeks in Treatment: 2 Visit Information History Since Last Visit Added or deleted any medications: No Patient Arrived: Ambulatory Any new allergies or adverse reactions: No Arrival Time: 10:09 Had a fall or experienced change in No Accompanied By: self activities of daily living that may affect Transfer Assistance: None risk of falls: Patient Identification Verified: Yes Signs or symptoms of abuse/neglect since last No Secondary Verification Process Yes visito Completed: Hospitalized since last visit: No Patient Requires Transmission-Based No Has Dressing in Place as Prescribed: Yes Precautions: Has Compression in Place as Prescribed: Yes Patient Has Alerts: Yes Pain Present Now: No Electronic Signature(s) Signed: 12/30/2014 12:05:52 PM By: Gretta Cool, RN, BSN, Kim RN, BSN Entered By: Gretta Cool, RN, BSN, Kim on 12/30/2014 10:09:50 [name] ([id number]) -------------------------------------------------------------------------------- Encounter Discharge Information Details Patient Name: [name] 12/30/2014 10:00 Date of Service: AM Medical Record [id number] Number: Patient Account Number: [id number] Jan 21, 1956 (59 y.o. Treating RN: Cornell Barman Date of Birth/Sex: Female) Other Clinician: Primary Care Physician: [name] Treating Christin Fudge Referring Physician: [name] Physician/Extender: Weeks in Treatment: 2 Encounter Discharge Information Items Discharge Pain Level: 0 Discharge Condition:  Stable Ambulatory Status: Ambulatory Discharge Destination: Home Transportation: Private Auto Accompanied By: self Schedule Follow-up Appointment: Yes Medication Reconciliation completed and provided to Patient/Care Yes Missi Mcmackin: Provided on Clinical Summary of Care: 12/30/2014 Form Type Recipient Paper Patient CM Electronic Signature(s) Signed: 12/30/2014 12:05:52 PM By: Gretta Cool RN, BSN, Kim RN, BSN Previous Signature: 12/30/2014 10:30:15 AM Version By: Ruthine Dose Entered By: Gretta Cool RN, BSN, Kim on 12/30/2014 10:31:56 [name] ([id number]) -------------------------------------------------------------------------------- Lower Extremity Assessment Details Patient Name: [name] 12/30/2014 10:00 Date of Service: AM Medical Record [id number] Number: Patient Account Number: [id number] 1956-01-16 (59 y.o. Treating RN: Cornell Barman Date of Birth/Sex: Female) Other Clinician: Primary Care Physician: [name] Treating Christin Fudge Referring Physician: [name] Physician/Extender: Weeks in Treatment: 2 Vascular Assessment Pulses: Posterior Tibial Dorsalis Pedis Palpable: [Left:Yes] Extremity colors, hair growth, and conditions: Extremity Color: [Left:Normal] Hair Growth on Extremity: [Left:Yes] Temperature of Extremity: [Left:Cool] Capillary Refill: [Left:< 3 seconds] Toe Nail Assessment Left: Right: Thick: No Discolored: No Deformed: No Improper Length and Hygiene: No Electronic Signature(s) Signed: 12/30/2014 12:05:52 PM By: Gretta Cool, RN, BSN, Kim RN, BSN Entered By: Gretta Cool, RN, BSN, Kim on 12/30/2014 10:11:34 [name] ([id number]) -------------------------------------------------------------------------------- Multi Wound Chart Details Patient Name: [name] 12/30/2014 10:00 Date of Service: AM Medical Record [id number] Number: Patient Account Number: [id number] 20-Mar-1955 (59 y.o. Treating RN: Cornell Barman Date of Birth/Sex: Female) Other Clinician: Primary Care Physician: Santiago Bur Referring Physician: [name] Physician/Extender: Weeks in Treatment: 2 Vital Signs Height(in): 67 Pulse(bpm): 75 Weight(lbs): 190 Blood Pressure 102/67 (mmHg): Body Mass Index(BMI): 30 Temperature(F): 98.0 Respiratory Rate 18 (breaths/min): Photos: [1:No Photos] [N/A:N/A] Wound Location: [1:Left Lower Leg - Medial] [N/A:N/A] Wounding Event: [1:Trauma] [N/A:N/A] Primary Etiology: [1:Trauma, Other] [N/A:N/A] Comorbid History: [1:Seizure Disorder] [N/A:N/A] Date Acquired: [1:11/09/2014] [N/A:N/A] Weeks of Treatment: [1:2] [N/A:N/A] Wound Status: [1:Open] [N/A:N/A] Measurements L x W x D 0.6x1.8x0.3 [N/A:N/A] (cm) Area (cm) : [1:0.848] [N/A:N/A] Volume (cm) : [1:0.254] [N/A:N/A] % Reduction in Area: [1:69.30%] [N/A:N/A] % Reduction in Volume: 54.10% [N/A:N/A] Classification: [  1:Full Thickness Without Exposed Support Structures] [N/A:N/A] Exudate Amount: [1:Small] [N/A:N/A] Exudate Type: [1:Serous] [N/A:N/A] Exudate Color: [1:amber] [N/A:N/A] Wound Margin: [1:Flat and Intact] [N/A:N/A] Granulation Amount: [1:Medium (34-66%)] [N/A:N/A] Granulation Quality: [1:Pink] [N/A:N/A] Necrotic Amount: [1:Medium (34-66%)] [N/A:N/A] Exposed Structures: [1:Fascia: No Fat: No Tendon: No Muscle: No] [N/A:N/A] Joint: No Bone: No Limited to Skin Breakdown Periwound Skin Texture: Scarring: Yes N/A N/A Edema: No Excoriation: No Induration: No Callus: No Crepitus: No Fluctuance: No Friable: No Rash: No Periwound Skin Maceration: No N/A N/A Moisture: Moist: No Dry/Scaly: No Periwound Skin Color: Erythema: Yes N/A N/A Hemosiderin Staining: Yes Atrophie Blanche: No Cyanosis: No Ecchymosis: No Mottled: No Pallor: No Rubor: No Erythema Location: Circumferential N/A N/A Temperature: No Abnormality N/A N/A Tenderness on Yes N/A N/A Palpation: Wound  Preparation: Ulcer Cleansing: N/A N/A Rinsed/Irrigated with Saline Topical Anesthetic Applied: Other: lidociane 4% Treatment Notes Electronic Signature(s) Signed: 12/30/2014 12:05:52 PM By: Gretta Cool, RN, BSN, Kim RN, BSN Entered By: Gretta Cool, RN, BSN, Kim on 12/30/2014 10:16:11 [name] ([id number]) -------------------------------------------------------------------------------- Multi-Disciplinary Care Plan Details Patient Name: RAYNE, COWDREY A. 12/30/2014 10:00 Date of Service: AM Medical Record [id number] Number: Patient Account Number: [id number] 12/12/1955 (59 y.o. Treating RN: Cornell Barman Date of Birth/Sex: Female) Other Clinician: Primary Care Physician: [name] Treating Christin Fudge Referring Physician: [name] Physician/Extender: Suella [place] in Treatment: 2 Active Inactive Orientation to the Wound Care Program Nursing Diagnoses: Knowledge deficit related to the wound healing center program Goals: Patient/caregiver will verbalize understanding of the [facility] Program Date Initiated: 12/10/2014 Goal Status: Active Interventions: Provide education on orientation to the wound center Notes: Wound/Skin Impairment Nursing Diagnoses: Impaired tissue integrity Goals: Ulcer/skin breakdown will heal within 14 weeks Date Initiated: 12/10/2014 Goal Status: Active Interventions: Assess patient/caregiver ability to obtain necessary supplies Treatment Activities: Skin care regimen initiated : 12/30/2014 Notes: Electronic Signature(s) Signed: 12/30/2014 12:05:52 PM By: Gretta Cool, RN, BSN, Kim RN, BSN Garson, Kennidy AMarland Kitchen ([id number]) Entered By: Gretta Cool, RN, BSN, Kim on 12/30/2014 10:16:04 [name] ([id number]) -------------------------------------------------------------------------------- Pain Assessment Details Patient Name: [name] 12/30/2014 10:00 Date of Service: AM Medical Record [id number] Number: Patient  Account Number: [id number] 11/08/1955 (59 y.o. Treating RN: Cornell Barman Date of Birth/Sex: Female) Other Clinician: Primary Care Physician: [name] Treating Christin Fudge Referring Physician: [name] Physician/Extender: Suella [place] in Treatment: 2 Active Problems Location of Pain Severity and Description of Pain Patient Has Paino No Site Locations Pain Management and Medication Current Pain Management: Electronic Signature(s) Signed: 12/30/2014 12:05:52 PM By: Gretta Cool, RN, BSN, Kim RN, BSN Entered By: Gretta Cool, RN, BSN, Kim on 12/30/2014 10:09:56 [name] ([id number]) -------------------------------------------------------------------------------- Patient/Caregiver Education Details Patient Name: [name] 12/30/2014 10:00 Date of Service: AM Medical Record [id number] Number: Patient Account Number: [id number] 09-01-1955 (59 y.o. Treating RN: Cornell Barman Date of Birth/Gender: Female) Other Clinician: Primary Care Physician: [name] Treating Christin Fudge Referring Physician: [name] Physician/Extender: Suella [place] in Treatment: 2 Education Assessment Education Provided To: Patient Education Topics Provided Wound/Skin Impairment: Handouts: Caring for Your Ulcer, Other: continue wound carfe as prescribed Methods: Demonstration, Explain/Verbal Responses: State content correctly Electronic Signature(s) Signed: 12/30/2014 12:05:52 PM By: Gretta Cool, RN, BSN, Kim RN, BSN Entered By: Gretta Cool, RN, BSN, Kim on 12/30/2014 10:32:19 [name] ([id number]) -------------------------------------------------------------------------------- Wound Assessment Details Patient Name: SIDRAH, HARDEN A. 12/30/2014 10:00 Date of Service: AM Medical Record [id number] Number: Patient Account Number: [id number] Mar 09, 1955 (59 y.o. Treating RN: Cornell Barman Date of Birth/Sex: Female) Other Clinician: Primary Care Physician: [name]  Treating Britto, Errol Referring  Physician: [name] Physician/Extender: Weeks in Treatment: 2 Wound Status Wound Number: 1 Primary Etiology: Trauma, Other Wound Location: Left Lower Leg - Medial Wound Status: Open Wounding Event: Trauma Comorbid History: Seizure Disorder Date Acquired: 11/09/2014 Weeks Of Treatment: 2 Clustered Wound: No Photos Photo Uploaded By: Gretta Cool, RN, BSN, Kim on 12/30/2014 11:50:53 Wound Measurements Length: (cm) 0.6 % Reduction in Ar Width: (cm) 1.8 % Reduction in Vo Depth: (cm) 0.3 Area: (cm) 0.848 Volume: (cm) 0.254 ea: 69.3% lume: 54.1% Wound Description Full Thickness Without Exposed Classification: Support Structures Wound Margin: Flat and Intact Exudate Small Amount: Exudate Type: Serous Exudate Color: amber Wound Bed Granulation Amount: Medium (34-66%) Exposed Structure Tien, Evah A. ([id number]) Granulation Quality: Pink Fascia Exposed: No Necrotic Amount: Medium (34-66%) Fat Layer Exposed: No Necrotic Quality: Adherent Slough Tendon Exposed: No Muscle Exposed: No Joint Exposed: No Bone Exposed: No Limited to Skin Breakdown Periwound Skin Texture Texture Color No Abnormalities Noted: No No Abnormalities Noted: No Callus: No Atrophie Blanche: No Crepitus: No Cyanosis: No Excoriation: No Ecchymosis: No Fluctuance: No Erythema: Yes Friable: No Erythema Location: Circumferential Induration: No Hemosiderin Staining: Yes Localized Edema: No Mottled: No Rash: No Pallor: No Scarring: Yes Rubor: No Moisture Temperature / Pain No Abnormalities Noted: No Temperature: No Abnormality Dry / Scaly: No Tenderness on Palpation: Yes Maceration: No Moist: No Wound Preparation Ulcer Cleansing: Rinsed/Irrigated with Saline Topical Anesthetic Applied: Other: lidociane 4%, Treatment Notes Wound #1 (Left, Medial Lower Leg) 1. Cleansed with: Clean wound with Normal Saline 2. Anesthetic Topical Lidocaine 4%  cream to wound bed prior to debridement 4. Dressing Applied: Medihoney Gel 5. Secondary Dressing Applied Bordered Foam Dressing Electronic Signature(s) Signed: 12/30/2014 12:05:52 PM By: Gretta Cool, RN, BSN, Kim RN, BSN Entered By: Gretta Cool, RN, BSN, Kim on 12/30/2014 10:15:20 [name] ([id number]) -------------------------------------------------------------------------------- [place] Details Patient Name: [name] 12/30/2014 10:00 Date of Service: AM Medical Record [id number] Number: Patient Account Number: [id number] 1955/12/06 (59 y.o. Treating RN: Cornell Barman Date of Birth/Sex: Female) Other Clinician: Primary Care Physician: Santiago Bur Referring Physician: [name] Physician/Extender: Weeks in Treatment: 2 Vital Signs Time Taken: 10:10 Temperature (F): 98.0 Height (in): 67 Pulse (bpm): 75 Weight (lbs): 190 Respiratory Rate (breaths/min): 18 Body Mass Index (BMI): 29.8 Blood Pressure (mmHg): 102/67 Reference Range: 80 - 120 mg / dl Electronic Signature(s) Signed: 12/30/2014 12:05:52 PM By: Gretta Cool, RN, BSN, Kim RN, BSN Entered By: Gretta Cool, RN, BSN, Kim on 12/30/2014 10:10:15

## 2014-12-30 NOTE — Progress Notes (Addendum)
TISHANA, CLINKENBEARD ([id number]) Visit Report for 12/30/2014 Chief Complaint Document Details Patient Name: [name], [name] 12/30/2014 10:00 Date of Service: AM Medical Record [id number] Number: Patient Account Number: [id number] 08-Aug-1955 (59 y.o. Treating RN: Cornell Barman Date of Birth/Sex: Female) Other Clinician: Primary Care Treating [place], Leslye Peer, [place] Physician: Physician/Extender: Referring Physician: Imelda Pillow in Treatment: 2 Information Obtained from: Patient Chief Complaint Patient presents to the wound care center for a consult due non healing wound. she has had a wound on her left lower extremity for about a month Electronic Signature(s) Signed: 12/30/2014 10:16:00 AM By: Christin Fudge MD, FACS Entered By: Christin Fudge on 12/30/2014 10:16:00 [name] ([id number]) -------------------------------------------------------------------------------- Debridement Details Patient Name: Shelia Lick A. 12/30/2014 10:00 Date of Service: AM Medical Record [id number] Number: Patient Account Number: [id number] September 10, 1955 (59 y.o. Treating RN: Cornell Barman Date of Birth/Sex: Female) Other Clinician: Primary Care Treating [place], Leslye Peer, Shamia Uppal Physician: Physician/Extender: Referring Physician: Imelda Pillow in Treatment: 2 Debridement Performed for Wound #1 Left,Medial Lower Leg Assessment: Performed By: Physician Christin Fudge, MD Debridement: Debridement Pre-procedure Yes Verification/Time Out Taken: Start Time: 10:18 Pain Control: Other : lidocaine 4% Level: Skin/Subcutaneous Tissue Total Area Debrided (L x 0.6 (cm) x 1.8 (cm) = 1.08 (cm) W): Tissue and other Viable, Non-Viable, Exudate, Fat, Fibrin/Slough, Subcutaneous material debrided: Instrument: Forceps Bleeding: Minimum Hemostasis Achieved: Pressure End Time: 10:22 Procedural Pain: 3 Post Procedural Pain: 1 Response to Treatment:  Procedure was tolerated well Post Debridement Measurements of Total Wound Length: (cm) 0.6 Width: (cm) 1.8 Depth: (cm) 0.4 Volume: (cm) 0.339 Post Procedure Diagnosis Same as Pre-procedure Electronic Signature(s) Signed: 12/30/2014 10:24:20 AM By: Christin Fudge MD, FACS Signed: 12/30/2014 12:05:52 PM By: Gretta Cool RN, BSN, Kim RN, BSN Entered By: Christin Fudge on 12/30/2014 10:24:20 [name] ([id number]) Mckercher, Briceyda AMarland Kitchen ([id number]) -------------------------------------------------------------------------------- HPI Details Patient Name: [name], [name] 12/30/2014 10:00 Date of Service: AM Medical Record [id number] Number: Patient Account Number: [id number] 1955/09/14 (59 y.o. Treating RN: Cornell Barman Date of Birth/Sex: Female) Other Clinician: Primary Care Treating [place], Leslye Peer, [place] Physician: Physician/Extender: Referring Physician: Imelda Pillow in Treatment: 2 History of Present Illness Location: left lower extremity Quality: Patient reports experiencing a dull pain to affected area(s). Severity: Patient states wound are getting worse. Duration: Patient has had the wound for < 4 weeks prior to presenting for treatment Timing: Pain in wound is Intermittent (comes and goes Context: The wound occurred when the patient had a fall and lacerated her left lower extremity about a month ago. She was sutured in the ER and sutures were removed in 15 days. Modifying Factors: Other treatment(s) tried include: he has been on doxycycline and clindamycin and has been applying local anti-biotic ointment over the wound Associated Signs and Symptoms: Patient reports having difficulty standing for long periods. HPI Description: this 59 year old patient had a lacerated wound to the left lower extremity about a month ago. This was sutured in the ER and the sutures were removed in about 2 weeks' time. The wound then got infected and gaped. She was seen  in the urgent care a couple of days ago with an infection and was put on doxycycline and clindamycin. She has been a smoker for several years and smokes about 10 cigarettes a day. Does not have diabetes mellitus. 12/17/2014 -- it has been a few days since she is given of smoking completely and I have commended her on this. Electronic Signature(s) Signed: 12/30/2014 10:16:08 AM By: Christin Fudge MD, FACS  Entered By: Christin Fudge on 12/30/2014 10:16:08 [name] ([id number]) -------------------------------------------------------------------------------- Physical Exam Details Patient Name: [name], [name] 12/30/2014 10:00 Date of Service: AM Medical Record [id number] Number: Patient Account Number: [id number] 01-27-1956 (59 y.o. Treating RN: Cornell Barman Date of Birth/Sex: Female) Other Clinician: Primary Care Treating [place], Leslye Peer, Renner Sebald Physician: Physician/Extender: Referring Physician: Imelda Pillow in Treatment: 2 Constitutional . Pulse regular. Respirations normal and unlabored. Afebrile. . Eyes Nonicteric. Reactive to light. Ears, Nose, Mouth, and Throat Lips, teeth, and gums WNL.Marland Kitchen Moist mucosa without lesions . Neck supple and nontender. No palpable supraclavicular or cervical adenopathy. Normal sized without goiter. Respiratory WNL. No retractions.. Cardiovascular Pedal Pulses WNL. No clubbing, cyanosis or edema. Lymphatic No adneopathy. No adenopathy. No adenopathy. Musculoskeletal Adexa without tenderness or enlargement.. Digits and nails w/o clubbing, cyanosis, infection, petechiae, ischemia, or inflammatory conditions.. Integumentary (Hair, Skin) No suspicious lesions. No crepitus or fluctuance. No peri-wound warmth or erythema. No masses.Marland Kitchen Psychiatric Judgement and insight Intact.. No evidence of depression, anxiety, or agitation.. Notes Some sharp debridement was required at the depths to remove subcutaneous debris and  eschar. It is looking much cleaner than before. Electronic Signature(s) Signed: 12/30/2014 10:25:02 AM By: Christin Fudge MD, FACS Entered By: Christin Fudge on 12/30/2014 10:25:01 [name] ([id number]) -------------------------------------------------------------------------------- Physician Orders Details Patient Name: Shelia Lick A. 12/30/2014 10:00 Date of Service: AM Medical Record [id number] Number: Patient Account Number: [id number] Oct 27, 1955 (59 y.o. Treating RN: Cornell Barman Date of Birth/Sex: Female) Other Clinician: Primary Care Treating [place], Leslye Peer, [place]ila Leys Physician: Physician/Extender: Referring Physician: Imelda Pillow in Treatment: 2 Verbal / Phone Orders: Yes Clinician: Cornell Barman Read Back and Verified: Yes Diagnosis Coding ICD-10 Coding Code Description [phone] Laceration without foreign body, left lower leg, initial encounter L97.222 Non-pressure chronic ulcer of left calf with fat layer exposed F17.219 Nicotine dependence, cigarettes, with unspecified nicotine-induced disorders Wound Cleansing Wound #1 Left,Medial Lower Leg o Clean wound with Normal Saline. Anesthetic Wound #1 Left,Medial Lower Leg o Topical Lidocaine 4% cream applied to wound bed prior to debridement Primary Wound Dressing Wound #1 Left,Medial Lower Leg o Other: - Medihoney Secondary Dressing Wound #1 Left,Medial Lower Leg o Boardered Foam Dressing o Non-adherent pad Dressing Change Frequency Wound #1 Left,Medial Lower Leg o Change dressing every other day. Follow-up Appointments Wound #1 Left,Medial Lower Leg o Return Appointment in 1 week. Edema Control Hafner, Gissele A. ([id number]) Wound #1 Left,Medial Lower Leg o Elevate legs to the level of the heart and pump ankles as often as possible Additional Orders / Instructions Wound #1 Left,Medial Lower Leg o Stop Smoking o Increase protein intake. o Activity as  tolerated Electronic Signature(s) Signed: 12/30/2014 12:05:52 PM By: Gretta Cool RN, BSN, Kim RN, BSN Signed: 12/30/2014 2:14:21 PM By: Christin Fudge MD, FACS Entered By: Gretta Cool RN, BSN, Kim on 12/30/2014 10:23:22 [name] ([id number]) -------------------------------------------------------------------------------- Problem List Details Patient Name: LYNDAL, ALAMILLO A. 12/30/2014 10:00 Date of Service: AM Medical Record [id number] Number: Patient Account Number: [id number] 1955/09/19 (59 y.o. Treating RN: Cornell Barman Date of Birth/Sex: Female) Other Clinician: Primary Care Treating [place], Leslye Peer, [place] Physician: Physician/Extender: Referring Physician: Imelda Pillow in Treatment: 2 Active Problems ICD-10 Encounter Code Description Active Date Diagnosis S81.812A Laceration without foreign body, left lower leg, initial 12/10/2014 Yes encounter L97.222 Non-pressure chronic ulcer of left calf with fat layer 12/10/2014 Yes exposed F17.219 Nicotine dependence, cigarettes, with unspecified 12/10/2014 Yes nicotine-induced disorders Inactive Problems Resolved Problems Electronic Signature(s) Signed: 12/30/2014 10:15:52 AM By: Christin Fudge MD, FACS  Entered By: Christin Fudge on 12/30/2014 10:15:52 Baldus, [name] ([id number]) -------------------------------------------------------------------------------- Progress Note Details Patient Name: SHELBY, PELTZ A. 12/30/2014 10:00 Date of Service: AM Medical Record [id number] Number: Patient Account Number: [id number] [birth year] (59 y.o. Treating RN: Cornell Barman Date of Birth/Sex: Female) Other Clinician: Primary Care Treating [place], Leslye Peer, [place] Physician: Physician/Extender: Referring Physician: Imelda Pillow in Treatment: 2 Subjective Chief Complaint Information obtained from Patient Patient presents to the wound care center for a consult due non healing wound. she has had  a wound on her left lower extremity for about a month History of Present Illness (HPI) The following HPI elements were documented for the patient's wound: Location: left lower extremity Quality: Patient reports experiencing a dull pain to affected area(s). Severity: Patient states wound are getting worse. Duration: Patient has had the wound for < 4 weeks prior to presenting for treatment Timing: Pain in wound is Intermittent (comes and goes Context: The wound occurred when the patient had a fall and lacerated her left lower extremity about a month ago. She was sutured in the ER and sutures were removed in 15 days. Modifying Factors: Other treatment(s) tried include: he has been on doxycycline and clindamycin and has been applying local anti-biotic ointment over the wound Associated Signs and Symptoms: Patient reports having difficulty standing for long periods. this 59 year old patient had a lacerated wound to the left lower extremity about a month ago. This was sutured in the ER and the sutures were removed in about 2 weeks' time. The wound then got infected and gaped. She was seen in the urgent care a couple of days ago with an infection and was put on doxycycline and clindamycin. She has been a smoker for several years and smokes about 10 cigarettes a day. Does not have diabetes mellitus. 12/17/2014 -- it has been a few days since she is given of smoking completely and I have commended her on this. Objective Marcus, Gracilyn A. ([id number]) Constitutional Pulse regular. Respirations normal and unlabored. Afebrile. Vitals Time Taken: 10:10 AM, Height: 67 in, Weight: 190 lbs, BMI: 29.8, Temperature: 98.0 F, Pulse: 75 bpm, Respiratory Rate: 18 breaths/min, Blood Pressure: 102/67 mmHg. Eyes Nonicteric. Reactive to light. Ears, Nose, Mouth, and Throat Lips, teeth, and gums WNL.Marland Kitchen Moist mucosa without lesions . Neck supple and nontender. No palpable supraclavicular or cervical  adenopathy. Normal sized without goiter. Respiratory WNL. No retractions.. Cardiovascular Pedal Pulses WNL. No clubbing, cyanosis or edema. Lymphatic No adneopathy. No adenopathy. No adenopathy. Musculoskeletal Adexa without tenderness or enlargement.. Digits and nails w/o clubbing, cyanosis, infection, petechiae, ischemia, or inflammatory conditions.Marland Kitchen Psychiatric Judgement and insight Intact.. No evidence of depression, anxiety, or agitation.. General Notes: Some sharp debridement was required at the depths to remove subcutaneous debris and eschar. It is looking much cleaner than before. Integumentary (Hair, Skin) No suspicious lesions. No crepitus or fluctuance. No peri-wound warmth or erythema. No masses.. Wound #1 status is Open. Original cause of wound was Trauma. The wound is located on the Left,Medial Lower Leg. The wound measures 0.6cm length x 1.8cm width x 0.3cm depth; 0.848cm^2 area and 0.254cm^3 volume. The wound is limited to skin breakdown. There is a small amount of serous drainage noted. The wound margin is flat and intact. There is medium (34-66%) pink granulation within the wound bed. There is a medium (34-66%) amount of necrotic tissue within the wound bed including Adherent Slough. The periwound skin appearance exhibited: Scarring, Hemosiderin Staining, Erythema. The periwound skin appearance did not exhibit: Callus, Crepitus, Excoriation,  Fluctuance, Friable, Induration, Localized Edema, Rash, Dry/Scaly, Maceration, Moist, Atrophie Blanche, Cyanosis, Ecchymosis, Mottled, Pallor, Rubor. The surrounding wound skin color is noted with erythema which is circumferential. Periwound temperature was noted as No Abnormality. The periwound has tenderness on palpation. Pohle, Ellajane A. ([id number]) Assessment Active Problems ICD-10 [phone] - Laceration without foreign body, left lower leg, initial encounter L97.222 - Non-pressure chronic ulcer of left calf with fat layer  exposed F17.219 - Nicotine dependence, cigarettes, with unspecified nicotine-induced disorders She's been doing well and we will continue with medihoney and a bordered foam dressing and see her back next week. She continues to stay off cigarettes. Procedures Wound #1 Wound #1 is a Trauma, Other located on the Left,Medial Lower Leg . There was a Skin/Subcutaneous Tissue Debridement ([id number]) debridement with total area of 1.08 sq cm performed by Christin Fudge, MD. with the following instrument(s): Forceps to remove Viable and Non-Viable tissue/material including Exudate, Fat, Fibrin/Slough, and Subcutaneous after achieving pain control using Other (lidocaine 4%). A time out was conducted prior to the start of the procedure. A Minimum amount of bleeding was controlled with Pressure. The procedure was tolerated well with a pain level of 3 throughout and a pain level of 1 following the procedure. Post Debridement Measurements: 0.6cm length x 1.8cm width x 0.4cm depth; 0.339cm^3 volume. Post procedure Diagnosis Wound #1: Same as Pre-Procedure Plan Wound Cleansing: Wound #1 Left,Medial Lower Leg: Clean wound with Normal Saline. Anesthetic: Wound #1 Left,Medial Lower Leg: Topical Lidocaine 4% cream applied to wound bed prior to debridement Primary Wound Dressing: KEYLEE, SHRESTHA A. ([id number]) Wound #1 Left,Medial Lower Leg: Other: - Medihoney Secondary Dressing: Wound #1 Left,Medial Lower Leg: Boardered Foam Dressing Non-adherent pad Dressing Change Frequency: Wound #1 Left,Medial Lower Leg: Change dressing every other day. Follow-up Appointments: Wound #1 Left,Medial Lower Leg: Return Appointment in 1 week. Edema Control: Wound #1 Left,Medial Lower Leg: Elevate legs to the level of the heart and pump ankles as often as possible Additional Orders / Instructions: Wound #1 Left,Medial Lower Leg: Stop Smoking Increase protein intake. Activity as tolerated She's been doing  well and we will continue with medihoney and a bordered foam dressing and see her back next week. She continues to stay off cigarettes. Electronic Signature(s) Signed: 12/30/2014 10:25:51 AM By: Christin Fudge MD, FACS Entered By: Christin Fudge on 12/30/2014 10:25:51 Ponder, Dariyah Loni Muse ([id number]) -------------------------------------------------------------------------------- SuperBill Details Patient Name: [name], Rondell A. Date of Service: 12/30/2014 Medical Record Number: [id number] Patient Account Number: [id number] Date of Birth/Sex: 01-22-1956 (59 y.o. Female) Treating RN: Cornell Barman Primary Care Physician: [name] Other Clinician: Referring Physician: [name] Treating Physician/Extender: Frann Rider in Treatment: 2 Diagnosis Coding ICD-10 Codes Code Description [phone] Laceration without foreign body, left lower leg, initial encounter L97.222 Non-pressure chronic ulcer of left calf with fat layer exposed F17.219 Nicotine dependence, cigarettes, with unspecified nicotine-induced disorders Facility Procedures CPT4: Description Modifier Quantity Code [id number] 11042 - DEB SUBQ TISSUE 20 SQ CM/< 1 ICD-10 Description Diagnosis S81.812A Laceration without foreign body, left lower leg, initial encounter L97.222 Non-pressure chronic ulcer of left calf with fat layer  exposed F17.219 Nicotine dependence, cigarettes, with unspecified nicotine-induced disorders Physician Procedures CPT4: Description Modifier Quantity Code [id number] 11042 - WC PHYS SUBQ TISS 20 SQ CM 1 ICD-10 Description Diagnosis S81.812A Laceration without foreign body, left lower leg, initial encounter L97.222 Non-pressure chronic ulcer of left calf with fat layer  exposed F17.219 Nicotine dependence, cigarettes, with unspecified nicotine-induced disorders Electronic Signature(s) Signed: 12/30/2014 10:26:32 AM By: Christin Fudge MD,  FACS Entered By: Christin Fudge on 12/30/2014 10:26:32

## 2015-01-06 ENCOUNTER — Ambulatory Visit: Payer: BLUE CROSS/BLUE SHIELD | Admitting: Surgery

## 2015-01-07 ENCOUNTER — Encounter: Payer: BLUE CROSS/BLUE SHIELD | Attending: Surgery | Admitting: Surgery

## 2015-01-07 DIAGNOSIS — G40909 Epilepsy, unspecified, not intractable, without status epilepticus: Secondary | ICD-10-CM | POA: Insufficient documentation

## 2015-01-07 DIAGNOSIS — L97222 Non-pressure chronic ulcer of left calf with fat layer exposed: Secondary | ICD-10-CM | POA: Insufficient documentation

## 2015-01-07 DIAGNOSIS — X58XXXA Exposure to other specified factors, initial encounter: Secondary | ICD-10-CM | POA: Diagnosis not present

## 2015-01-07 DIAGNOSIS — F17219 Nicotine dependence, cigarettes, with unspecified nicotine-induced disorders: Secondary | ICD-10-CM | POA: Diagnosis not present

## 2015-01-07 DIAGNOSIS — S81812A Laceration without foreign body, left lower leg, initial encounter: Secondary | ICD-10-CM | POA: Diagnosis not present

## 2015-01-07 DIAGNOSIS — F329 Major depressive disorder, single episode, unspecified: Secondary | ICD-10-CM | POA: Insufficient documentation

## 2015-01-08 NOTE — Progress Notes (Signed)
[name], [name] ([id number]) Visit Report for 01/07/2015 Chief Complaint Document Details Patient Name: [name], [name] 01/07/2015 9:30 Date of Service: AM Medical Record [id number] Number: Patient Account Number: [id number] [birth year] (59 y.o. Treating RN: Montey Hora Date of Birth/Sex: Female) Other Clinician: Primary Care Physician: Santiago Bur Referring Physician: [name] Physician/Extender: Weeks in Treatment: 4 Information Obtained from: Patient Chief Complaint Patient presents to the wound care center for a consult due non healing wound. she has had a wound on her left lower extremity for about a month Electronic Signature(s) Signed: 01/07/2015 10:18:10 AM By: Christin Fudge MD, FACS Entered By: Christin Fudge on 01/07/2015 10:18:10 [name] ([id number]) -------------------------------------------------------------------------------- Debridement Details Patient Name: Shelia Lick A. 01/07/2015 9:30 Date of Service: AM Medical Record [id number] Number: Patient Account Number: [id number] 1955/12/20 (59 y.o. Treating RN: Montey Hora Date of Birth/Sex: Female) Other Clinician: Primary Care Physician: Brain Hilts, Iva Posten Referring Physician: [name] Physician/Extender: Weeks in Treatment: 4 Debridement Performed for Wound #1 Left,Medial Lower Leg Assessment: Performed By: Physician Christin Fudge, MD Debridement: Debridement Pre-procedure Yes Verification/Time Out Taken: Start Time: 10:01 Pain Control: Lidocaine 4% Topical Solution Level: Skin/Subcutaneous Tissue Total Area Debrided (L x 0.6 (cm) x 1.8 (cm) = 1.08 (cm) W): Tissue and other Viable, Non-Viable, Eschar, Fibrin/Slough, Subcutaneous material debrided: Instrument: Forceps Bleeding: Minimum Hemostasis Achieved: Pressure End Time: 10:04 Procedural Pain: 0 Post Procedural Pain: 0 Response to Treatment:  Procedure was tolerated well Post Debridement Measurements of Total Wound Length: (cm) 0.6 Width: (cm) 1.8 Depth: (cm) 0.3 Volume: (cm) 0.254 Post Procedure Diagnosis Same as Pre-procedure Electronic Signature(s) Signed: 01/07/2015 10:18:05 AM By: Christin Fudge MD, FACS Signed: 01/07/2015 5:15:29 PM By: Montey Hora Entered By: Christin Fudge on 01/07/2015 10:18:05 [name] ([id number]) -------------------------------------------------------------------------------- HPI Details Patient Name: Shelia Lick A. 01/07/2015 9:30 Date of Service: AM Medical Record [id number] Number: Patient Account Number: [id number] 03-23-1955 (59 y.o. Treating RN: Montey Hora Date of Birth/Sex: Female) Other Clinician: Primary Care Physician: Santiago Bur Referring Physician: [name] Physician/Extender: Weeks in Treatment: 4 History of Present Illness Location: left lower extremity Quality: Patient reports experiencing a dull pain to affected area(s). Severity: Patient states wound are getting worse. Duration: Patient has had the wound for < 4 weeks prior to presenting for treatment Timing: Pain in wound is Intermittent (comes and goes Context: The wound occurred when the patient had a fall and lacerated her left lower extremity about a month ago. She was sutured in the ER and sutures were removed in 15 days. Modifying Factors: Other treatment(s) tried include: he has been on doxycycline and clindamycin and has been applying local anti-biotic ointment over the wound Associated Signs and Symptoms: Patient reports having difficulty standing for long periods. HPI Description: this 59 year old patient had a lacerated wound to the left lower extremity about a month ago. This was sutured in the ER and the sutures were removed in about 2 weeks' time. The wound then got infected and gaped. She was seen in the urgent care a couple of days ago with an  infection and was put on doxycycline and clindamycin. She has been a smoker for several years and smokes about 10 cigarettes a day. Does not have diabetes mellitus. 12/17/2014 -- it has been a few days since she is given of smoking completely and I have commended her on this. 01/07/2015 -- has relapsed a bit with her smoking but has had several social issues and I have encouraged her  to continue working hard on her smoking cessation. She is going to be away on a cruise this coming week. Electronic Signature(s) Signed: 01/07/2015 10:18:42 AM By: Christin Fudge MD, FACS Entered By: Christin Fudge on 01/07/2015 10:18:41 [name] ([id number]) -------------------------------------------------------------------------------- Physical Exam Details Patient Name: Shelia Lick A. 01/07/2015 9:30 Date of Service: AM Medical Record [id number] Number: Patient Account Number: [id number] Sep 04, 1955 (59 y.o. Treating RN: Montey Hora Date of Birth/Sex: Female) Other Clinician: Primary Care Physician: Santiago Bur Referring Physician: [name] Physician/Extender: Weeks in Treatment: 4 Constitutional . Pulse regular. Respirations normal and unlabored. Afebrile. . Eyes Nonicteric. Reactive to light. Ears, Nose, Mouth, and Throat Lips, teeth, and gums WNL.Marland Kitchen Moist mucosa without lesions . Neck supple and nontender. No palpable supraclavicular or cervical adenopathy. Normal sized without goiter. Respiratory WNL. No retractions.. Cardiovascular Pedal Pulses WNL. No clubbing, cyanosis or edema. Lymphatic No adneopathy. No adenopathy. No adenopathy. Musculoskeletal Adexa without tenderness or enlargement.. Digits and nails w/o clubbing, cyanosis, infection, petechiae, ischemia, or inflammatory conditions.. Integumentary (Hair, Skin) No suspicious lesions. No crepitus or fluctuance. No peri-wound warmth or erythema. No  masses.Marland Kitchen Psychiatric Judgement and insight Intact.. No evidence of depression, anxiety, or agitation.. Notes the wound is in need of some sharp debridement and I have done this with forceps and gauze. Irrigation of the wound reveals there is healthy granulation tissue at the depths. Electronic Signature(s) Signed: 01/07/2015 10:19:14 AM By: Christin Fudge MD, FACS Entered By: Christin Fudge on 01/07/2015 10:19:14 [name] ([id number]) -------------------------------------------------------------------------------- Physician Orders Details Patient Name: Shelia Lick A. 01/07/2015 9:30 Date of Service: AM Medical Record [id number] Number: Patient Account Number: [id number] February 23, 1956 (59 y.o. Treating RN: Montey Hora Date of Birth/Sex: Female) Other Clinician: Primary Care Physician: Santiago Bur Referring Physician: [name] Physician/Extender: Suella [place] in Treatment: 4 Verbal / Phone Orders: Yes Clinician: Montey Hora Read Back and Verified: Yes Diagnosis Coding Wound Cleansing Wound #1 Left,Medial Lower Leg o Clean wound with Normal Saline. Anesthetic Wound #1 Left,Medial Lower Leg o Topical Lidocaine 4% cream applied to wound bed prior to debridement Primary Wound Dressing Wound #1 Left,Medial Lower Leg o Other: - Medihoney Secondary Dressing Wound #1 Left,Medial Lower Leg o Boardered Foam Dressing o Non-adherent pad Dressing Change Frequency Wound #1 Left,Medial Lower Leg o Change dressing every other day. Follow-up Appointments Wound #1 Left,Medial Lower Leg o Return Appointment in 1 week. Edema Control Wound #1 Left,Medial Lower Leg o Elevate legs to the level of the heart and pump ankles as often as possible Additional Orders / Instructions Wound #1 Left,Medial Lower Leg o Stop Smoking o Increase protein intake. CLATIE, KESSEN ([id number]) o Activity as tolerated Electronic  Signature(s) Signed: 01/07/2015 11:57:04 AM By: Christin Fudge MD, FACS Signed: 01/07/2015 5:15:29 PM By: Montey Hora Entered By: Montey Hora on 01/07/2015 10:05:23 [name] ([id number]) -------------------------------------------------------------------------------- Problem List Details Patient Name: [name], [name] 01/07/2015 9:30 Date of Service: AM Medical Record [id number] Number: Patient Account Number: [id number] May 09, 1955 (59 y.o. Treating RN: Montey Hora Date of Birth/Sex: Female) Other Clinician: Primary Care Physician: Santiago Bur Referring Physician: [name] Physician/Extender: Weeks in Treatment: 4 Active Problems ICD-10 Encounter Code Description Active Date Diagnosis S81.812A Laceration without foreign body, left lower leg, initial 12/10/2014 Yes encounter L97.222 Non-pressure chronic ulcer of left calf with fat layer 12/10/2014 Yes exposed F17.219 Nicotine dependence, cigarettes, with unspecified 12/10/2014 Yes nicotine-induced disorders Inactive Problems Resolved Problems Electronic Signature(s) Signed: 01/07/2015 10:17:52 AM By: Christin Fudge  MD, FACS Entered By: Christin Fudge on 01/07/2015 10:17:52 [name] ([id number]) -------------------------------------------------------------------------------- Progress Note Details Patient Name: [name], [name] 01/07/2015 9:30 Date of Service: AM Medical Record [id number] Number: Patient Account Number: [id number] [birth year] (59 y.o. Treating RN: Montey Hora Date of Birth/Sex: Female) Other Clinician: Primary Care Physician: Santiago Bur Referring Physician: [name] Physician/Extender: Weeks in Treatment: 4 Subjective Chief Complaint Information obtained from Patient Patient presents to the wound care center for a consult due non healing wound. she has had a wound on her left lower extremity for  about a month History of Present Illness (HPI) The following HPI elements were documented for the patient's wound: Location: left lower extremity Quality: Patient reports experiencing a dull pain to affected area(s). Severity: Patient states wound are getting worse. Duration: Patient has had the wound for < 4 weeks prior to presenting for treatment Timing: Pain in wound is Intermittent (comes and goes Context: The wound occurred when the patient had a fall and lacerated her left lower extremity about a month ago. She was sutured in the ER and sutures were removed in 15 days. Modifying Factors: Other treatment(s) tried include: he has been on doxycycline and clindamycin and has been applying local anti-biotic ointment over the wound Associated Signs and Symptoms: Patient reports having difficulty standing for long periods. this 59 year old patient had a lacerated wound to the left lower extremity about a month ago. This was sutured in the ER and the sutures were removed in about 2 weeks' time. The wound then got infected and gaped. She was seen in the urgent care a couple of days ago with an infection and was put on doxycycline and clindamycin. She has been a smoker for several years and smokes about 10 cigarettes a day. Does not have diabetes mellitus. 12/17/2014 -- it has been a few days since she is given of smoking completely and I have commended her on this. 01/07/2015 -- has relapsed a bit with her smoking but has had several social issues and I have encouraged her to continue working hard on her smoking cessation. She is going to be away on a cruise this coming week. [name], [name] ([id number]) Objective Constitutional Pulse regular. Respirations normal and unlabored. Afebrile. Vitals Time Taken: 9:47 AM, Height: 67 in, Weight: 190 lbs, BMI: 29.8, Temperature: 98.2 F, Pulse: 82 bpm, Respiratory Rate: 18 breaths/min, Blood Pressure: 110/64 mmHg. Eyes Nonicteric. Reactive  to light. Ears, Nose, Mouth, and Throat Lips, teeth, and gums WNL.Marland Kitchen Moist mucosa without lesions . Neck supple and nontender. No palpable supraclavicular or cervical adenopathy. Normal sized without goiter. Respiratory WNL. No retractions.. Cardiovascular Pedal Pulses WNL. No clubbing, cyanosis or edema. Lymphatic No adneopathy. No adenopathy. No adenopathy. Musculoskeletal Adexa without tenderness or enlargement.. Digits and nails w/o clubbing, cyanosis, infection, petechiae, ischemia, or inflammatory conditions.Marland Kitchen Psychiatric Judgement and insight Intact.. No evidence of depression, anxiety, or agitation.. General Notes: the wound is in need of some sharp debridement and I have done this with forceps and gauze. Irrigation of the wound reveals there is healthy granulation tissue at the depths. Integumentary (Hair, Skin) No suspicious lesions. No crepitus or fluctuance. No peri-wound warmth or erythema. No masses.. Wound #1 status is Open. Original cause of wound was Trauma. The wound is located on the Left,Medial Lower Leg. The wound measures 0.6cm length x 1.8cm width x 0.3cm depth; 0.848cm^2 area and 0.254cm^3 volume. The wound is limited to skin breakdown. There is no tunneling or undermining noted. There is  a small amount of serous drainage noted. The wound margin is flat and intact. There is medium (34-66%) pink granulation within the wound bed. There is a medium (34-66%) amount of necrotic tissue within the wound bed including Adherent Slough. The periwound skin appearance exhibited: Scarring, [name], [name] ([id number]) Hemosiderin Staining, Erythema. The periwound skin appearance did not exhibit: Callus, Crepitus, Excoriation, Fluctuance, Friable, Induration, Localized Edema, Rash, Dry/Scaly, Maceration, Moist, Atrophie Blanche, Cyanosis, Ecchymosis, Mottled, Pallor, Rubor. The surrounding wound skin color is noted with erythema which is circumferential. Periwound  temperature was noted as No Abnormality. The periwound has tenderness on palpation. Assessment Active Problems ICD-10 S81.812A - Laceration without foreign body, left lower leg, initial encounter L97.222 - Non-pressure chronic ulcer of left calf with fat layer exposed F17.219 - Nicotine dependence, cigarettes, with unspecified nicotine-induced disorders I have encouraged her to continue with the local care as before with Medi Honey and a bordered foam. She is cautioned about getting this wound wet while she is on her vacation and smoking cessation has been emphasized. She will come back and see me next week. Procedures Wound #1 Wound #1 is a Trauma, Other located on the Left,Medial Lower Leg . There was a Skin/Subcutaneous Tissue Debridement ([id number]) debridement with total area of 1.08 sq cm performed by Christin Fudge, MD. with the following instrument(s): Forceps to remove Viable and Non-Viable tissue/material including Fibrin/Slough, Eschar, and Subcutaneous after achieving pain control using Lidocaine 4% Topical Solution. A time out was conducted prior to the start of the procedure. A Minimum amount of bleeding was controlled with Pressure. The procedure was tolerated well with a pain level of 0 throughout and a pain level of 0 following the procedure. Post Debridement Measurements: 0.6cm length x 1.8cm width x 0.3cm depth; 0.254cm^3 volume. Post procedure Diagnosis Wound #1: Same as Pre-Procedure Plan Wound Cleansing: [name], [name] ([id number]) Wound #1 Left,Medial Lower Leg: Clean wound with Normal Saline. Anesthetic: Wound #1 Left,Medial Lower Leg: Topical Lidocaine 4% cream applied to wound bed prior to debridement Primary Wound Dressing: Wound #1 Left,Medial Lower Leg: Other: - Medihoney Secondary Dressing: Wound #1 Left,Medial Lower Leg: Boardered Foam Dressing Non-adherent pad Dressing Change Frequency: Wound #1 Left,Medial Lower Leg: Change dressing every  other day. Follow-up Appointments: Wound #1 Left,Medial Lower Leg: Return Appointment in 1 week. Edema Control: Wound #1 Left,Medial Lower Leg: Elevate legs to the level of the heart and pump ankles as often as possible Additional Orders / Instructions: Wound #1 Left,Medial Lower Leg: Stop Smoking Increase protein intake. Activity as tolerated I have encouraged her to continue with the local care as before with Medi Honey and a bordered foam. She is cautioned about getting this wound wet while she is on her vacation and smoking cessation has been emphasized. She will come back and see me next week. Electronic Signature(s) Signed: 01/07/2015 10:20:12 AM By: Christin Fudge MD, FACS Entered By: Christin Fudge on 01/07/2015 10:20:12 [name], [name] ([id number]) -------------------------------------------------------------------------------- SuperBill Details Patient Name: [name], [name] Date of Service: 01/07/2015 Medical Record Number: [id number] Patient Account Number: [id number] Date of Birth/Sex: [birth year] (59 y.o. Female) Treating RN: Montey Hora Primary Care Physician: [name] Other Clinician: Referring Physician: [name] Treating Physician/Extender: Frann Rider in Treatment: 4 Diagnosis Coding ICD-10 Codes Code Description [phone] Laceration without foreign body, left lower leg, initial encounter L97.222 Non-pressure chronic ulcer of left calf with fat layer exposed F17.219 Nicotine dependence, cigarettes, with unspecified nicotine-induced disorders Facility Procedures CPT4: Description Modifier Quantity Code [id number] 11042 -  DEB SUBQ TISSUE 20 SQ CM/< 1 ICD-10 Description Diagnosis S81.812A Laceration without foreign body, left lower leg, initial encounter L97.222 Non-pressure chronic ulcer of left calf with fat layer  exposed F17.219 Nicotine dependence, cigarettes, with unspecified nicotine-induced disorders Physician Procedures CPT4:  Description Modifier Quantity Code [id number] - WC PHYS SUBQ TISS 20 SQ CM 1 ICD-10 Description Diagnosis S81.812A Laceration without foreign body, left lower leg, initial encounter L97.222 Non-pressure chronic ulcer of left calf with fat layer  exposed F17.219 Nicotine dependence, cigarettes, with unspecified nicotine-induced disorders Electronic Signature(s) Signed: 01/07/2015 10:20:24 AM By: Christin Fudge MD, FACS Entered By: Christin Fudge on 01/07/2015 10:20:24

## 2015-01-08 NOTE — Progress Notes (Addendum)
[name], [name] Visit Report for 01/07/2015 Arrival Information Details Patient Name: [name], [name] Date of Service: 01/07/2015 9:30 AM Medical Record Number: [id number] Patient Account Number: [id number] Date of Birth/Sex: 10/23/1955 (59 y.o. Female) Treating RN: Montey Hora Primary Care Physician: [name] Other Clinician: Referring Physician: [name] Treating Physician/Extender: Frann Rider in Treatment: 4 Visit Information History Since Last Visit Added or deleted any medications: No Patient Arrived: Ambulatory Any new allergies or adverse reactions: No Arrival Time: 09:46 Had a fall or experienced change in No Accompanied By: self activities of daily living that may affect Transfer Assistance: None risk of falls: Patient Identification Verified: Yes Signs or symptoms of abuse/neglect since last No Secondary Verification Process Yes visito Completed: Hospitalized since last visit: No Patient Requires Transmission-Based No Pain Present Now: No Precautions: Patient Has Alerts: Yes Electronic Signature(s) Signed: 01/07/2015 5:15:29 PM By: Montey Hora Entered By: Montey Hora on 01/07/2015 09:47:06 [place], [name] ([id number]) -------------------------------------------------------------------------------- Encounter Discharge Information Details Patient Name: [name], [name] Date of Service: 01/07/2015 9:30 AM Medical Record Number: [id number] Patient Account Number: [id number] Date of Birth/Sex: 10-15-1955 (59 y.o. Female) Treating RN: Montey Hora Primary Care Physician: [name] Other Clinician: Referring Physician: [name] Treating Physician/Extender: Frann Rider in Treatment: 4 Encounter Discharge Information Items Discharge Pain Level: 0 Discharge Condition: Stable Ambulatory Status: Ambulatory Discharge Destination: Home Transportation: Private Auto Accompanied By:  self Schedule Follow-up Appointment: Yes Medication Reconciliation completed and provided to Patient/Care No Deann Mclaine: Provided on Clinical Summary of Care: 01/07/2015 Form Type Recipient Paper Patient CM Electronic Signature(s) Signed: 01/08/2015 11:37:54 AM By: Ruthine Dose Previous Signature: 01/07/2015 10:17:53 AM Version By: Ruthine Dose Entered By: Ruthine Dose on 01/08/2015 11:37:53 [name], [name] ([id number]) -------------------------------------------------------------------------------- Lower Extremity Assessment Details Patient Name: [name], [name] Date of Service: 01/07/2015 9:30 AM Medical Record Number: [id number] Patient Account Number: [id number] Date of Birth/Sex: 09-13-1955 (59 y.o. Female) Treating RN: Montey Hora Primary Care Physician: [name] Other Clinician: Referring Physician: [name] Treating Physician/Extender: Frann Rider in Treatment: 4 Vascular Assessment Pulses: Posterior Tibial Dorsalis Pedis Palpable: [Left:Yes] Extremity colors, hair growth, and conditions: Extremity Color: [Left:Normal] Hair Growth on Extremity: [Left:Yes] Temperature of Extremity: [Left:Warm] Capillary Refill: [Left:< 3 seconds] Toe Nail Assessment Left: Right: Thick: Yes Discolored: No Deformed: No Improper Length and Hygiene: No Electronic Signature(s) Signed: 01/07/2015 5:15:29 PM By: Montey Hora Entered By: Montey Hora on 01/07/2015 09:57:09 [name], [name] ([id number]) -------------------------------------------------------------------------------- Multi Wound Chart Details Patient Name: [name], [name] Date of Service: 01/07/2015 9:30 AM Medical Record Number: [id number] Patient Account Number: [id number] Date of Birth/Sex: [birth year] (59 y.o. Female) Treating RN: Montey Hora Primary Care Physician: [name] Other Clinician: Referring Physician: [name] Treating Physician/Extender:  Frann Rider in Treatment: 4 Vital Signs Height(in): 67 Pulse(bpm): 82 Weight(lbs): 190 Blood Pressure 110/64 (mmHg): Body Mass Index(BMI): 30 Temperature(F): 98.2 Respiratory Rate 18 (breaths/min): Photos: [1:No Photos] [N/A:N/A] Wound Location: [1:Left Lower Leg - Medial] [N/A:N/A] Wounding Event: [1:Trauma] [N/A:N/A] Primary Etiology: [1:Trauma, Other] [N/A:N/A] Comorbid History: [1:Seizure Disorder] [N/A:N/A] Date Acquired: [1:11/09/2014] [N/A:N/A] Weeks of Treatment: [1:4] [N/A:N/A] Wound Status: [1:Open] [N/A:N/A] Measurements L x W x D 0.6x1.8x0.3 [N/A:N/A] (cm) Area (cm) : [1:0.848] [N/A:N/A] Volume (cm) : [1:0.254] [N/A:N/A] % Reduction in Area: [1:69.30%] [N/A:N/A] % Reduction in Volume: 54.10% [N/A:N/A] Classification: [1:Full Thickness Without Exposed Support Structures] [N/A:N/A] Exudate Amount: [1:Small] [N/A:N/A] Exudate Type: [1:Serous] [N/A:N/A] Exudate Color: [1:amber] [N/A:N/A] Wound Margin: [1:Flat and Intact] [N/A:N/A] Granulation Amount: [1:Medium (34-66%)] [N/A:N/A] Granulation Quality: [  1:Pink] [N/A:N/A] Necrotic Amount: [1:Medium (34-66%)] [N/A:N/A] Exposed Structures: [1:Fascia: No Fat: No Tendon: No Muscle: No Joint: No Bone: No] [N/A:N/A] Limited to Skin Breakdown Epithelialization: None N/A N/A Periwound Skin Texture: Scarring: Yes N/A N/A Edema: No Excoriation: No Induration: No Callus: No Crepitus: No Fluctuance: No Friable: No Rash: No Periwound Skin Maceration: No N/A N/A Moisture: Moist: No Dry/Scaly: No Periwound Skin Color: Erythema: Yes N/A N/A Hemosiderin Staining: Yes Atrophie Blanche: No Cyanosis: No Ecchymosis: No Mottled: No Pallor: No Rubor: No Erythema Location: Circumferential N/A N/A Temperature: No Abnormality N/A N/A Tenderness on Yes N/A N/A Palpation: Wound Preparation: Ulcer Cleansing: N/A N/A Rinsed/Irrigated with Saline Topical Anesthetic Applied: Other: lidociane 4% Treatment  Notes Electronic Signature(s) Signed: 01/07/2015 5:15:29 PM By: Montey Hora Entered By: Montey Hora on 01/07/2015 09:58:15 [name], [name] ([id number]) -------------------------------------------------------------------------------- [place] Details Patient Name: [name], [name] Date of Service: 01/07/2015 9:30 AM Medical Record Number: [id number] Patient Account Number: [id number] Date of Birth/Sex: Nov 12, 1955 (59 y.o. Female) Treating RN: Montey Hora Primary Care Physician: [name] Other Clinician: Referring Physician: [name] Treating Physician/Extender: Frann Rider in Treatment: 4 Active Inactive Orientation to the Wound Care Program Nursing Diagnoses: Knowledge deficit related to the wound healing center program Goals: Patient/caregiver will verbalize understanding of the [place] Program Date Initiated: 12/10/2014 Goal Status: Active Interventions: Provide education on orientation to the wound center Notes: Wound/Skin Impairment Nursing Diagnoses: Impaired tissue integrity Goals: Ulcer/skin breakdown will heal within 14 weeks Date Initiated: 12/10/2014 Goal Status: Active Interventions: Assess patient/caregiver ability to obtain necessary supplies Treatment Activities: Skin care regimen initiated : 01/07/2015 Notes: Electronic Signature(s) Signed: 01/07/2015 5:15:29 PM By: Montey Hora Entered By: Montey Hora on 01/07/2015 09:58:08 [place], [name] ([id number]) [place], [name] ([id number]) -------------------------------------------------------------------------------- Patient/Caregiver Education Details Patient Name: [name], [name] Date of Service: 01/07/2015 9:30 AM Medical Record Number: [id number] Patient Account Number: [id number] Date of Birth/Gender: February 22, 1956 (59 y.o. Female) Treating RN: Montey Hora Primary Care Physician: [name] Other  Clinician: Referring Physician: [name] Treating Physician/Extender: Frann Rider in Treatment: 4 Education Assessment Education Provided To: Patient Education Topics Provided Wound/Skin Impairment: Handouts: Other: wound care while on cruise Methods: Demonstration, Explain/Verbal Responses: State content correctly Electronic Signature(s) Signed: 01/07/2015 5:15:29 PM By: Montey Hora Entered By: Montey Hora on 01/07/2015 10:23:54 [place], [name] ([id number]) -------------------------------------------------------------------------------- Wound Assessment Details Patient Name: [name], [name] Date of Service: 01/07/2015 9:30 AM Medical Record Number: [id number] Patient Account Number: [id number] Date of Birth/Sex: 1955-09-30 (59 y.o. Female) Treating RN: Montey Hora Primary Care Physician: [name] Other Clinician: Referring Physician: [name] Treating Physician/Extender: Frann Rider in Treatment: 4 Wound Status Wound Number: 1 Primary Etiology: Trauma, Other Wound Location: Left Lower Leg - Medial Wound Status: Open Wounding Event: Trauma Comorbid History: Seizure Disorder Date Acquired: 11/09/2014 Weeks Of Treatment: 4 Clustered Wound: No Photos Photo Uploaded By: Montey Hora on 01/07/2015 12:15:32 Wound Measurements Length: (cm) 0.6 Width: (cm) 1.8 Depth: (cm) 0.3 Area: (cm) 0.848 Volume: (cm) 0.254 % Reduction in Area: 69.3% % Reduction in Volume: 54.1% Epithelialization: None Tunneling: No Undermining: No Wound Description Full Thickness Without Exposed Classification: Support Structures Wound Margin: Flat and Intact Exudate Small Amount: Exudate Type: Serous Exudate Color: amber Wound Bed Granulation Amount: Medium (34-66%) Exposed Structure Granulation Quality: Pink Fascia Exposed: No Necrotic Amount: Medium (34-66%) Fat Layer Exposed: No Beaudin, [name] ([id number]) Necrotic  Quality: Adherent Slough Tendon Exposed: No Muscle Exposed: No Joint Exposed: No Bone Exposed: No Limited  to Skin Breakdown Periwound Skin Texture Texture Color No Abnormalities Noted: No No Abnormalities Noted: No Callus: No Atrophie Blanche: No Crepitus: No Cyanosis: No Excoriation: No Ecchymosis: No Fluctuance: No Erythema: Yes Friable: No Erythema Location: Circumferential Induration: No Hemosiderin Staining: Yes Localized Edema: No Mottled: No Rash: No Pallor: No Scarring: Yes Rubor: No Moisture Temperature / Pain No Abnormalities Noted: No Temperature: No Abnormality Dry / Scaly: No Tenderness on Palpation: Yes Maceration: No Moist: No Wound Preparation Ulcer Cleansing: Rinsed/Irrigated with Saline Topical Anesthetic Applied: Other: lidociane 4%, Treatment Notes Wound #1 (Left, Medial Lower Leg) 1. Cleansed with: Clean wound with Normal Saline 2. Anesthetic Topical Lidocaine 4% cream to wound bed prior to debridement 4. Dressing Applied: Medihoney Gel 5. Secondary Dressing Applied Bordered Foam Dressing Dry Gauze Electronic Signature(s) Signed: 01/07/2015 5:15:29 PM By: Montey Hora Entered By: Montey Hora on 01/07/2015 09:56:42 Henk, Karlena [name] Kitchen ([id number]) -------------------------------------------------------------------------------- [place] Details Patient Name: [name], [name] Date of Service: 01/07/2015 9:30 AM Medical Record Number: [id number] Patient Account Number: [id number] Date of Birth/Sex: 10-11-1955 (59 y.o. Female) Treating RN: Montey Hora Primary Care Physician: [name] Other Clinician: Referring Physician: [name] Treating Physician/Extender: Frann Rider in Treatment: 4 Vital Signs Time Taken: 09:47 Temperature (F): 98.2 Height (in): 67 Pulse (bpm): 82 Weight (lbs): 190 Respiratory Rate (breaths/min): 18 Body Mass Index (BMI): 29.8 Blood Pressure (mmHg): 110/64 Reference Range:  80 - 120 mg / dl Electronic Signature(s) Signed: 01/07/2015 5:15:29 PM By: Montey Hora Entered By: Montey Hora on 01/07/2015 09:49:01

## 2015-01-16 ENCOUNTER — Encounter: Payer: BLUE CROSS/BLUE SHIELD | Admitting: Surgery

## 2015-01-16 DIAGNOSIS — L97222 Non-pressure chronic ulcer of left calf with fat layer exposed: Secondary | ICD-10-CM | POA: Diagnosis not present

## 2015-01-17 NOTE — Progress Notes (Signed)
[name], Shelia ([id number]) Visit Report for 01/16/2015 Arrival Information Details Patient Name: [name], [name] Date of Service: 01/16/2015 2:00 PM Medical Record Number: [id number] Patient Account Number: [id number] Date of Birth/Sex: [birth year] (59 y.o. Female) Treating RN: Montey Hora Primary Care Physician: [name] Other Clinician: Referring Physician: [name] Treating Physician/Extender: Frann Rider in Treatment: 5 Visit Information History Since Last Visit Added or deleted any medications: No Patient Arrived: Ambulatory Any new allergies or adverse reactions: No Arrival Time: 14:40 Had a fall or experienced change in No Accompanied By: self activities of daily living that may affect Transfer Assistance: None risk of falls: Patient Identification Verified: Yes Signs or symptoms of abuse/neglect since last No Secondary Verification Process Yes visito Completed: Hospitalized since last visit: No Patient Requires Transmission-Based No Pain Present Now: No Precautions: Patient Has Alerts: Yes Electronic Signature(s) Signed: 01/16/2015 5:07:12 PM By: Montey Hora Entered By: Montey Hora on 01/16/2015 14:40:51 [place], [place]. ([id number]) -------------------------------------------------------------------------------- Encounter Discharge Information Details Patient Name: [name], [name] Date of Service: 01/16/2015 2:00 PM Medical Record Number: [id number] Patient Account Number: [id number] Date of Birth/Sex: [birth year] (59 y.o. Female) Treating RN: Montey Hora Primary Care Physician: [name] Other Clinician: Referring Physician: [name] Treating Physician/Extender: Frann Rider in Treatment: 5 Encounter Discharge Information Items Discharge Pain Level: 0 Discharge Condition: Stable Ambulatory Status: Ambulatory Discharge Destination: Home Transportation: Private Auto Accompanied By:  self Schedule Follow-up Appointment: Yes Medication Reconciliation completed and provided to Patient/Care No Kimyatta Lecy: Provided on Clinical Summary of Care: 01/16/2015 Form Type Recipient Paper Patient CM Electronic Signature(s) Signed: 01/16/2015 3:21:43 PM By: Ruthine Dose Entered By: Ruthine Dose on 01/16/2015 15:21:42 [place], [place] Kitchen ([id number]) -------------------------------------------------------------------------------- Lower Extremity Assessment Details Patient Name: [name], [name] Date of Service: 01/16/2015 2:00 PM Medical Record Number: [id number] Patient Account Number: [id number] Date of Birth/Sex: 1955-08-06 (59 y.o. Female) Treating RN: Montey Hora Primary Care Physician: [name] Other Clinician: Referring Physician: [name] Treating Physician/Extender: Frann Rider in Treatment: 5 Vascular Assessment Pulses: Posterior Tibial Dorsalis Pedis Palpable: [Left:Yes] Extremity colors, hair growth, and conditions: Extremity Color: [Left:Normal] Hair Growth on Extremity: [Left:Yes] Temperature of Extremity: [Left:Warm] Capillary Refill: [Left:< 3 seconds] Toe Nail Assessment Left: Right: Thick: No Discolored: No Deformed: No Improper Length and Hygiene: No Electronic Signature(s) Signed: 01/16/2015 5:07:12 PM By: Montey Hora Entered By: Montey Hora on 01/16/2015 14:49:01 [place], [place] ([id number]) -------------------------------------------------------------------------------- Multi Wound Chart Details Patient Name: [name], [name] Date of Service: 01/16/2015 2:00 PM Medical Record Number: [id number] Patient Account Number: [id number] Date of Birth/Sex: 1955/11/24 (59 y.o. Female) Treating RN: Montey Hora Primary Care Physician: [name] Other Clinician: Referring Physician: [name] Treating Physician/Extender: Frann Rider in Treatment: 5 Vital Signs Height(in):  67 Pulse(bpm): 81 Weight(lbs): 190 Blood Pressure 110/61 (mmHg): Body Mass Index(BMI): 30 Temperature(F): 98.3 Respiratory Rate 18 (breaths/min): Photos: [1:No Photos] [N/A:N/A] Wound Location: [1:Left Lower Leg - Medial] [N/A:N/A] Wounding Event: [1:Trauma] [N/A:N/A] Primary Etiology: [1:Trauma, Other] [N/A:N/A] Comorbid History: [1:Seizure Disorder] [N/A:N/A] Date Acquired: [1:11/09/2014] [N/A:N/A] Weeks of Treatment: [1:5] [N/A:N/A] Wound Status: [1:Open] [N/A:N/A] Measurements L x W x D 0.6x1.4x0.3 [N/A:N/A] (cm) Area (cm) : [1:0.66] [N/A:N/A] Volume (cm) : [1:0.198] [N/A:N/A] % Reduction in Area: [1:76.10%] [N/A:N/A] % Reduction in Volume: 64.20% [N/A:N/A] Classification: [1:Full Thickness Without Exposed Support Structures] [N/A:N/A] Exudate Amount: [1:Small] [N/A:N/A] Exudate Type: [1:Serous] [N/A:N/A] Exudate Color: [1:amber] [N/A:N/A] Wound Margin: [1:Flat and Intact] [N/A:N/A] Granulation Amount: [1:Medium (34-66%)] [N/A:N/A] Granulation Quality: [1:Pink] [N/A:N/A] Necrotic Amount: [1:Medium (34-66%)] [N/A:N/A] Exposed Structures: [  1:Fascia: No Fat: No Tendon: No Muscle: No Joint: No Bone: No] [N/A:N/A] Limited to Skin Breakdown Epithelialization: None N/A N/A Periwound Skin Texture: Scarring: Yes N/A N/A Edema: No Excoriation: No Induration: No Callus: No Crepitus: No Fluctuance: No Friable: No Rash: No Periwound Skin Maceration: No N/A N/A Moisture: Moist: No Dry/Scaly: No Periwound Skin Color: Erythema: Yes N/A N/A Hemosiderin Staining: Yes Atrophie Blanche: No Cyanosis: No Ecchymosis: No Mottled: No Pallor: No Rubor: No Erythema Location: Circumferential N/A N/A Temperature: No Abnormality N/A N/A Tenderness on Yes N/A N/A Palpation: Wound Preparation: Ulcer Cleansing: N/A N/A Rinsed/Irrigated with Saline Topical Anesthetic Applied: Other: lidociane 4% Treatment Notes Electronic Signature(s) Signed: 01/16/2015 5:07:12 PM By:  Montey Hora Entered By: Montey Hora on 01/16/2015 14:49:16 [place], [place]. ([id number]) -------------------------------------------------------------------------------- [place] Details Patient Name: [name], [name] Date of Service: 01/16/2015 2:00 PM Medical Record Number: [id number] Patient Account Number: [id number] Date of Birth/Sex: 1955/07/07 (59 y.o. Female) Treating RN: Montey Hora Primary Care Physician: [name] Other Clinician: Referring Physician: [name] Treating Physician/Extender: Frann Rider in Treatment: 5 Active Inactive Orientation to the Wound Care Program Nursing Diagnoses: Knowledge deficit related to the wound healing center program Goals: Patient/caregiver will verbalize understanding of the [place] Program Date Initiated: 12/10/2014 Goal Status: Active Interventions: Provide education on orientation to the wound center Notes: Wound/Skin Impairment Nursing Diagnoses: Impaired tissue integrity Goals: Ulcer/skin breakdown will heal within 14 weeks Date Initiated: 12/10/2014 Goal Status: Active Interventions: Assess patient/caregiver ability to obtain necessary supplies Treatment Activities: Skin care regimen initiated : 01/16/2015 Notes: Electronic Signature(s) Signed: 01/16/2015 5:07:12 PM By: Montey Hora Entered By: Montey Hora on 01/16/2015 14:49:08 [place], [place]. ([id number]) [place]au, Amarylis AMarland Kitchen ([id number]) -------------------------------------------------------------------------------- Patient/Caregiver Education Details Patient Name: [name], Neidra A. Date of Service: 01/16/2015 2:00 PM Medical Record Number: [id number] Patient Account Number: [id number] Date of Birth/Gender: January 03, 1956 (59 y.o. Female) Treating RN: Montey Hora Primary Care Physician: [name] Other Clinician: Referring Physician: [name] Treating  Physician/Extender: Frann Rider in Treatment: 5 Education Assessment Education Provided To: Patient Education Topics Provided Wound/Skin Impairment: Handouts: Other: wound care as ordered Methods: Demonstration, Explain/Verbal Responses: State content correctly Electronic Signature(s) Signed: 01/16/2015 5:07:12 PM By: Montey Hora Entered By: Montey Hora on 01/16/2015 15:21:30 [place]ahr, Korina AMarland Kitchen ([id number]) -------------------------------------------------------------------------------- Wound Assessment Details Patient Name: [name], Zehava A. Date of Service: 01/16/2015 2:00 PM Medical Record Number: [id number] Patient Account Number: [id number] Date of Birth/Sex: May 16, 1955 (59 y.o. Female) Treating RN: Montey Hora Primary Care Physician: [name] Other Clinician: Referring Physician: [name] Treating Physician/Extender: Frann Rider in Treatment: 5 Wound Status Wound Number: 1 Primary Etiology: Trauma, Other Wound Location: Left Lower Leg - Medial Wound Status: Open Wounding Event: Trauma Comorbid History: Seizure Disorder Date Acquired: 11/09/2014 Weeks Of Treatment: 5 Clustered Wound: No Photos Photo Uploaded By: Montey Hora on 01/16/2015 16:58:20 Wound Measurements Length: (cm) 0.6 Width: (cm) 1.4 Depth: (cm) 0.3 Area: (cm) 0.66 Volume: (cm) 0.198 % Reduction in Area: 76.1% % Reduction in Volume: 64.2% Epithelialization: None Tunneling: No Undermining: No Wound Description Full Thickness Without Exposed Classification: Support Structures Wound Margin: Flat and Intact Exudate Small Amount: Exudate Type: Serous Exudate Color: amber Wound Bed Granulation Amount: Medium (34-66%) Exposed Structure Granulation Quality: Pink Fascia Exposed: No Necrotic Amount: Medium (34-66%) Fat Layer Exposed: No [place], Kenitha A. ([id number]) Necrotic Quality: Adherent Slough Tendon Exposed: No Muscle Exposed:  No Joint Exposed: No Bone Exposed: No Limited to Skin Breakdown Periwound Skin Texture Texture Color No Abnormalities  Noted: No No Abnormalities Noted: No Callus: No Atrophie Blanche: No Crepitus: No Cyanosis: No Excoriation: No Ecchymosis: No Fluctuance: No Erythema: Yes Friable: No Erythema Location: Circumferential Induration: No Hemosiderin Staining: Yes Localized Edema: No Mottled: No Rash: No Pallor: No Scarring: Yes Rubor: No Moisture Temperature / Pain No Abnormalities Noted: No Temperature: No Abnormality Dry / Scaly: No Tenderness on Palpation: Yes Maceration: No Moist: No Wound Preparation Ulcer Cleansing: Rinsed/Irrigated with Saline Topical Anesthetic Applied: Other: lidociane 4%, Treatment Notes Wound #1 (Left, Medial Lower Leg) 1. Cleansed with: Clean wound with Normal Saline 2. Anesthetic Topical Lidocaine 4% cream to wound bed prior to debridement 3. Peri-wound Care: Skin Prep 4. Dressing Applied: Medihoney Gel 5. Secondary Dressing Applied Bordered Foam Dressing Non-Adherent pad Electronic Signature(s) Signed: 01/16/2015 5:07:12 PM By: Montey Hora Entered By: Montey Hora on 01/16/2015 14:48:21 [place], [place] ([id number]) Brue, Nakaya AMarland Kitchen ([id number]) -------------------------------------------------------------------------------- [place] Details Patient Name: [name], Taiwana A. Date of Service: 01/16/2015 2:00 PM Medical Record Number: [id number] Patient Account Number: [id number] Date of Birth/Sex: [birth year] (59 y.o. Female) Treating RN: Montey Hora Primary Care Physician: [name] Other Clinician: Referring Physician: [name] Treating Physician/Extender: Frann Rider in Treatment: 5 Vital Signs Time Taken: 14:41 Temperature (F): 98.3 Height (in): 67 Pulse (bpm): 81 Weight (lbs): 190 Respiratory Rate (breaths/min): 18 Body Mass Index (BMI): 29.8 Blood Pressure (mmHg):  110/61 Reference Range: 80 - 120 mg / dl Electronic Signature(s) Signed: 01/16/2015 5:07:12 PM By: Montey Hora Entered By: Montey Hora on 01/16/2015 14:42:23

## 2015-01-17 NOTE — Progress Notes (Signed)
[name] ([id number]) Visit Report for 01/16/2015 Chief Complaint Document Details Patient Name: [name], [name]. 01/16/2015 2:00 Date of Service: PM Medical Record [id number] Number: Patient Account Number: [id number] 1955-08-16 (59 y.o. Treating RN: Montey Hora Date of Birth/Sex: Female) Other Clinician: Primary Care Physician: Santiago Bur Referring Physician: [name] Physician/Extender: Weeks in Treatment: 5 Information Obtained from: Patient Chief Complaint Patient presents to the wound care center for a consult due non healing wound. she has had a wound on her left lower extremity for about a month Electronic Signature(s) Signed: 01/16/2015 3:17:34 PM By: Christin Fudge MD, FACS Entered By: Christin Fudge on 01/16/2015 15:17:34 [name] ([id number]) -------------------------------------------------------------------------------- Debridement Details Patient Name: Shelia Lick A. 01/16/2015 2:00 Date of Service: PM Medical Record [id number] Number: Patient Account Number: [id number] 02-Apr-1955 (59 y.o. Treating RN: Montey Hora Date of Birth/Sex: Female) Other Clinician: Primary Care Physician: Brain Hilts, Ary Rudnick Referring Physician: [name] Physician/Extender: Weeks in Treatment: 5 Debridement Performed for Wound #1 Left,Medial Lower Leg Assessment: Performed By: Physician Christin Fudge, MD Debridement: Debridement Pre-procedure Yes Verification/Time Out Taken: Start Time: 15:08 Pain Control: Lidocaine 4% Topical Solution Level: Skin/Subcutaneous Tissue Total Area Debrided (L x 0.6 (cm) x 1.4 (cm) = 0.84 (cm) W): Tissue and other Viable, Non-Viable, Eschar, Fibrin/Slough, Subcutaneous material debrided: Instrument: Forceps Bleeding: Minimum Hemostasis Achieved: Pressure End Time: 15:10 Procedural Pain: 0 Post Procedural Pain: 0 Response to Treatment:  Procedure was tolerated well Post Debridement Measurements of Total Wound Length: (cm) 0.6 Width: (cm) 1.4 Depth: (cm) 0.3 Volume: (cm) 0.198 Post Procedure Diagnosis Same as Pre-procedure Electronic Signature(s) Signed: 01/16/2015 3:17:28 PM By: Christin Fudge MD, FACS Signed: 01/16/2015 5:07:12 PM By: Montey Hora Entered By: Christin Fudge on 01/16/2015 15:17:28 [name], [name] ([id number]) -------------------------------------------------------------------------------- HPI Details Patient Name: Shelia Lick A. 01/16/2015 2:00 Date of Service: PM Medical Record [id number] Number: Patient Account Number: [id number] 09-16-1955 (59 y.o. Treating RN: Montey Hora Date of Birth/Sex: Female) Other Clinician: Primary Care Physician: Santiago Bur Referring Physician: [name] Physician/Extender: Weeks in Treatment: 5 History of Present Illness Location: left lower extremity Quality: Patient reports experiencing a dull pain to affected area(s). Severity: Patient states wound are getting worse. Duration: Patient has had the wound for < 4 weeks prior to presenting for treatment Timing: Pain in wound is Intermittent (comes and goes Context: The wound occurred when the patient had a fall and lacerated her left lower extremity about a month ago. She was sutured in the ER and sutures were removed in 15 days. Modifying Factors: Other treatment(s) tried include: he has been on doxycycline and clindamycin and has been applying local anti-biotic ointment over the wound Associated Signs and Symptoms: Patient reports having difficulty standing for long periods. HPI Description: this 59 year old patient had a lacerated wound to the left lower extremity about a month ago. This was sutured in the ER and the sutures were removed in about 2 weeks' time. The wound then got infected and gaped. She was seen in the urgent care a couple of days ago with  an infection and was put on doxycycline and clindamycin. She has been a smoker for several years and smokes about 10 cigarettes a day. Does not have diabetes mellitus. 12/17/2014 -- it has been a few days since she is given of smoking completely and I have commended her on this. 01/07/2015 -- has relapsed a bit with her smoking but has had several social issues and I have encouraged her  to continue working hard on her smoking cessation. She is going to be away on a cruise this coming week. Electronic Signature(s) Signed: 01/16/2015 3:17:46 PM By: Christin Fudge MD, FACS Entered By: Christin Fudge on 01/16/2015 15:17:46 [name] ([id number]) -------------------------------------------------------------------------------- Physical Exam Details Patient Name: Shelia Lick A. 01/16/2015 2:00 Date of Service: PM Medical Record [id number] Number: Patient Account Number: [id number] 1956-01-19 (59 y.o. Treating RN: Montey Hora Date of Birth/Sex: Female) Other Clinician: Primary Care Physician: Santiago Bur Referring Physician: [name] Physician/Extender: Weeks in Treatment: 5 Constitutional . Pulse regular. Respirations normal and unlabored. Afebrile. . Eyes Nonicteric. Reactive to light. Ears, Nose, Mouth, and Throat Lips, teeth, and gums WNL.Marland Kitchen Moist mucosa without lesions . Neck supple and nontender. No palpable supraclavicular or cervical adenopathy. Normal sized without goiter. Respiratory WNL. No retractions.. Cardiovascular Pedal Pulses WNL. No clubbing, cyanosis or edema. Chest Breasts symmetical and no nipple discharge.. Breast tissue WNL, no masses, lumps, or tenderness.. Lymphatic No adneopathy. No adenopathy. No adenopathy. Musculoskeletal Adexa without tenderness or enlargement.. Digits and nails w/o clubbing, cyanosis, infection, petechiae, ischemia, or inflammatory conditions.. Integumentary (Hair, Skin) No  suspicious lesions. No crepitus or fluctuance. No peri-wound warmth or erythema. No masses.Marland Kitchen Psychiatric Judgement and insight Intact.. No evidence of depression, anxiety, or agitation.. Notes the lateral part of the wound is healed nicely and there is a little bit of slough and depth to the medial part of the wound which I show sharply debrided today. Electronic Signature(s) Signed: 01/16/2015 3:18:26 PM By: Christin Fudge MD, FACS Entered By: Christin Fudge on 01/16/2015 15:18:26 [name] ([id number]) -------------------------------------------------------------------------------- Physician Orders Details Patient Name: Shelia Lick A. 01/16/2015 2:00 Date of Service: PM Medical Record [id number] Number: Patient Account Number: [id number] 1955/12/11 (59 y.o. Treating RN: Montey Hora Date of Birth/Sex: Female) Other Clinician: Primary Care Physician: Santiago Bur Referring Physician: [name] Physician/Extender: Suella [place] in Treatment: 5 Verbal / Phone Orders: Yes Clinician: Montey Hora Read Back and Verified: Yes Diagnosis Coding Wound Cleansing Wound #1 Left,Medial Lower Leg o Clean wound with Normal Saline. Anesthetic Wound #1 Left,Medial Lower Leg o Topical Lidocaine 4% cream applied to wound bed prior to debridement Primary Wound Dressing Wound #1 Left,Medial Lower Leg o Other: - Medihoney Secondary Dressing Wound #1 Left,Medial Lower Leg o Boardered Foam Dressing o Non-adherent pad Dressing Change Frequency Wound #1 Left,Medial Lower Leg o Change dressing every other day. Follow-up Appointments Wound #1 Left,Medial Lower Leg o Return Appointment in 1 week. Edema Control Wound #1 Left,Medial Lower Leg o Elevate legs to the level of the heart and pump ankles as often as possible Additional Orders / Instructions Wound #1 Left,Medial Lower Leg o Stop Smoking o Increase protein  intake. SUMAYA, GORTER ([id number]) o Activity as tolerated Electronic Signature(s) Signed: 01/16/2015 4:04:17 PM By: Christin Fudge MD, FACS Signed: 01/16/2015 5:07:12 PM By: Montey Hora Entered By: Montey Hora on 01/16/2015 15:09:36 [name], [name] ([id number]) -------------------------------------------------------------------------------- Problem List Details Patient Name: [name], [name] 01/16/2015 2:00 Date of Service: PM Medical Record [id number] Number: Patient Account Number: [id number] 03-17-1955 (59 y.o. Treating RN: Montey Hora Date of Birth/Sex: Female) Other Clinician: Primary Care Physician: Santiago Bur Referring Physician: [name] Physician/Extender: Weeks in Treatment: 5 Active Problems ICD-10 Encounter Code Description Active Date Diagnosis S81.812A Laceration without foreign body, left lower leg, initial 12/10/2014 Yes encounter L97.222 Non-pressure chronic ulcer of left calf with fat layer 12/10/2014 Yes exposed F17.219 Nicotine dependence, cigarettes, with unspecified 12/10/2014  Yes nicotine-induced disorders Inactive Problems Resolved Problems Electronic Signature(s) Signed: 01/16/2015 3:17:20 PM By: Christin Fudge MD, FACS Entered By: Christin Fudge on 01/16/2015 15:17:20 [name] ([id number]) -------------------------------------------------------------------------------- Progress Note Details Patient Name: Shelia Lick A. 01/16/2015 2:00 Date of Service: PM Medical Record [id number] Number: Patient Account Number: [id number] 07/13/1955 (59 y.o. Treating RN: Montey Hora Date of Birth/Sex: Female) Other Clinician: Primary Care Physician: Santiago Bur Referring Physician: [name] Physician/Extender: Weeks in Treatment: 5 Subjective Chief Complaint Information obtained from Patient Patient presents to the wound care center for a  consult due non healing wound. she has had a wound on her left lower extremity for about a month History of Present Illness (HPI) The following HPI elements were documented for the patient's wound: Location: left lower extremity Quality: Patient reports experiencing a dull pain to affected area(s). Severity: Patient states wound are getting worse. Duration: Patient has had the wound for < 4 weeks prior to presenting for treatment Timing: Pain in wound is Intermittent (comes and goes Context: The wound occurred when the patient had a fall and lacerated her left lower extremity about a month ago. She was sutured in the ER and sutures were removed in 15 days. Modifying Factors: Other treatment(s) tried include: he has been on doxycycline and clindamycin and has been applying local anti-biotic ointment over the wound Associated Signs and Symptoms: Patient reports having difficulty standing for long periods. this 59 year old patient had a lacerated wound to the left lower extremity about a month ago. This was sutured in the ER and the sutures were removed in about 2 weeks' time. The wound then got infected and gaped. She was seen in the urgent care a couple of days ago with an infection and was put on doxycycline and clindamycin. She has been a smoker for several years and smokes about 10 cigarettes a day. Does not have diabetes mellitus. 12/17/2014 -- it has been a few days since she is given of smoking completely and I have commended her on this. 01/07/2015 -- has relapsed a bit with her smoking but has had several social issues and I have encouraged her to continue working hard on her smoking cessation. She is going to be away on a cruise this coming week. [name], [name] ([id number]) Objective Constitutional Pulse regular. Respirations normal and unlabored. Afebrile. Vitals Time Taken: 2:41 PM, Height: 67 in, Weight: 190 lbs, BMI: 29.8, Temperature: 98.3 F, Pulse: 81 bpm,  Respiratory Rate: 18 breaths/min, Blood Pressure: 110/61 mmHg. Eyes Nonicteric. Reactive to light. Ears, Nose, Mouth, and Throat Lips, teeth, and gums WNL.Marland Kitchen Moist mucosa without lesions . Neck supple and nontender. No palpable supraclavicular or cervical adenopathy. Normal sized without goiter. Respiratory WNL. No retractions.. Cardiovascular Pedal Pulses WNL. No clubbing, cyanosis or edema. Chest Breasts symmetical and no nipple discharge.. Breast tissue WNL, no masses, lumps, or tenderness.. Lymphatic No adneopathy. No adenopathy. No adenopathy. Musculoskeletal Adexa without tenderness or enlargement.. Digits and nails w/o clubbing, cyanosis, infection, petechiae, ischemia, or inflammatory conditions.Marland Kitchen Psychiatric Judgement and insight Intact.. No evidence of depression, anxiety, or agitation.. General Notes: the lateral part of the wound is healed nicely and there is a little bit of slough and depth to the medial part of the wound which I show sharply debrided today. Integumentary (Hair, Skin) No suspicious lesions. No crepitus or fluctuance. No peri-wound warmth or erythema. No masses.. Wound #1 status is Open. Original cause of wound was Trauma. The wound is located on the Left,Medial Lower Leg.  The wound measures 0.6cm length x 1.4cm width x 0.3cm depth; 0.66cm^2 area and 0.198cm^3 volume. The wound is limited to skin breakdown. There is no tunneling or undermining noted. [name], [name] ([id number]) There is a small amount of serous drainage noted. The wound margin is flat and intact. There is medium (34-66%) pink granulation within the wound bed. There is a medium (34-66%) amount of necrotic tissue within the wound bed including Adherent Slough. The periwound skin appearance exhibited: Scarring, Hemosiderin Staining, Erythema. The periwound skin appearance did not exhibit: Callus, Crepitus, Excoriation, Fluctuance, Friable, Induration, Localized Edema, Rash, Dry/Scaly,  Maceration, Moist, Atrophie Blanche, Cyanosis, Ecchymosis, Mottled, Pallor, Rubor. The surrounding wound skin color is noted with erythema which is circumferential. Periwound temperature was noted as No Abnormality. The periwound has tenderness on palpation. Assessment Active Problems ICD-10 S81.812A - Laceration without foreign body, left lower leg, initial encounter L97.222 - Non-pressure chronic ulcer of left calf with fat layer exposed F17.219 - Nicotine dependence, cigarettes, with unspecified nicotine-induced disorders Overall the progress is good and we will continue with medihoney and local dressing as before. She will come back and see me next week. Procedures Wound #1 Wound #1 is a Trauma, Other located on the Left,Medial Lower Leg . There was a Skin/Subcutaneous Tissue Debridement [id number]) debridement with total area of 0.84 sq cm performed by Christin Fudge, MD. with the following instrument(s): Forceps to remove Viable and Non-Viable tissue/material including Fibrin/Slough, Eschar, and Subcutaneous after achieving pain control using Lidocaine 4% Topical Solution. A time out was conducted prior to the start of the procedure. A Minimum amount of bleeding was controlled with Pressure. The procedure was tolerated well with a pain level of 0 throughout and a pain level of 0 following the procedure. Post Debridement Measurements: 0.6cm length x 1.4cm width x 0.3cm depth; 0.198cm^3 volume. Post procedure Diagnosis Wound #1: Same as Pre-Procedure Plan [name], [name] ([id number]) Wound Cleansing: Wound #1 Left,Medial Lower Leg: Clean wound with Normal Saline. Anesthetic: Wound #1 Left,Medial Lower Leg: Topical Lidocaine 4% cream applied to wound bed prior to debridement Primary Wound Dressing: Wound #1 Left,Medial Lower Leg: Other: - Medihoney Secondary Dressing: Wound #1 Left,Medial Lower Leg: Boardered Foam Dressing Non-adherent pad Dressing Change  Frequency: Wound #1 Left,Medial Lower Leg: Change dressing every other day. Follow-up Appointments: Wound #1 Left,Medial Lower Leg: Return Appointment in 1 week. Edema Control: Wound #1 Left,Medial Lower Leg: Elevate legs to the level of the heart and pump ankles as often as possible Additional Orders / Instructions: Wound #1 Left,Medial Lower Leg: Stop Smoking Increase protein intake. Activity as tolerated Overall the progress is good and we will continue with medihoney and local dressing as before. She will come back and see me next week. Electronic Signature(s) Signed: 01/16/2015 3:19:24 PM By: Christin Fudge MD, FACS Entered By: Christin Fudge on 01/16/2015 15:19:24 [name], [name] ([id number]) -------------------------------------------------------------------------------- SuperBill Details Patient Name: [name], Elaria A. Date of Service: 01/16/2015 Medical Record Number: [id number] Patient Account Number: [id number] Date of Birth/Sex: June 18, 1955 (59 y.o. Female) Treating RN: Montey Hora Primary Care Physician: [name] Other Clinician: Referring Physician: [name] Treating Physician/Extender: Frann Rider in Treatment: 5 Diagnosis Coding ICD-10 Codes Code Description [phone] Laceration without foreign body, left lower leg, initial encounter L97.222 Non-pressure chronic ulcer of left calf with fat layer exposed F17.219 Nicotine dependence, cigarettes, with unspecified nicotine-induced disorders Facility Procedures CPT4: Description Modifier Quantity Code [id number] - DEB SUBQ TISSUE 20 SQ CM/< 1 ICD-10 Description Diagnosis S81.812A Laceration without foreign  body, left lower leg, initial encounter L97.222 Non-pressure chronic ulcer of left calf with fat layer  exposed F17.219 Nicotine dependence, cigarettes, with unspecified nicotine-induced disorders Physician Procedures CPT4: Description Modifier Quantity Code [id number] - WC  PHYS SUBQ TISS 20 SQ CM 1 ICD-10 Description Diagnosis S81.812A Laceration without foreign body, left lower leg, initial encounter L97.222 Non-pressure chronic ulcer of left calf with fat layer  exposed F17.219 Nicotine dependence, cigarettes, with unspecified nicotine-induced disorders Electronic Signature(s) Signed: 01/16/2015 3:19:37 PM By: Christin Fudge MD, FACS Entered By: Christin Fudge on 01/16/2015 15:19:37

## 2015-01-21 ENCOUNTER — Ambulatory Visit: Payer: BLUE CROSS/BLUE SHIELD | Admitting: Surgery

## 2015-01-24 ENCOUNTER — Encounter: Payer: BLUE CROSS/BLUE SHIELD | Admitting: Surgery

## 2015-01-24 DIAGNOSIS — L97222 Non-pressure chronic ulcer of left calf with fat layer exposed: Secondary | ICD-10-CM | POA: Diagnosis not present

## 2015-01-27 ENCOUNTER — Ambulatory Visit: Payer: BLUE CROSS/BLUE SHIELD | Admitting: Surgery

## 2015-01-28 ENCOUNTER — Ambulatory Visit: Payer: BLUE CROSS/BLUE SHIELD | Admitting: Surgery

## 2015-01-28 NOTE — Progress Notes (Signed)
Shelia, [name] ([id number]) Visit Report for 01/24/2015 Chief Complaint Document Details Patient Name: [name], [name] 01/24/2015 3:30 Date of Service: PM Medical Record [id number] Number: Patient Account Number: [id number] [birth year] (59 y.o. Treating RN: Cornell Barman Date of Birth/Sex: Female) Other Clinician: Primary Care Physician: [name] Treating Christin Fudge Referring Physician: [name] Physician/Extender: Weeks in Treatment: 6 Information Obtained from: Patient Chief Complaint Patient presents to the wound care center for a consult due non healing wound. she has had a wound on her left lower extremity for about a month Electronic Signature(s) Signed: 01/24/2015 4:23:52 PM By: Christin Fudge MD, FACS Entered By: Christin Fudge on 01/24/2015 16:23:52 [place], Aliyyah Loni Muse ([id number]) -------------------------------------------------------------------------------- Debridement Details Patient Name: Shelia Lick A. 01/24/2015 3:30 Date of Service: PM Medical Record [id number] Number: Patient Account Number: [id number] Nov 03, 1955 (59 y.o. Treating RN: Cornell Barman Date of Birth/Sex: Female) Other Clinician: Primary Care Physician: Santiago Bur Referring Physician: [name] Physician/Extender: Weeks in Treatment: 6 Debridement Performed for Wound #1 Left,Medial Lower Leg Assessment: Performed By: Physician Christin Fudge, MD Debridement: Debridement Pre-procedure Yes Verification/Time Out Taken: Start Time: 16:00 Pain Control: Other : 4% lidocaine cream Level: Skin/Subcutaneous Tissue Total Area Debrided (L x 0.4 (cm) x 0.7 (cm) = 0.28 (cm) W): Tissue and other Fibrin/Slough, Subcutaneous material debrided: Instrument: Forceps Bleeding: Minimum Hemostasis Achieved: Pressure End Time: 16:03 Procedural Pain: 2 Post Procedural Pain: 0 Response to Treatment: Procedure was tolerated well Post  Debridement Measurements of Total Wound Length: (cm) 0.4 Width: (cm) 0.7 Depth: (cm) 0.3 Volume: (cm) 0.066 Post Procedure Diagnosis Same as Pre-procedure Electronic Signature(s) Signed: 01/24/2015 4:23:46 PM By: Christin Fudge MD, FACS Signed: 01/24/2015 5:40:12 PM By: Gretta Cool RN, BSN, Kim RN, BSN Entered By: Christin Fudge on 01/24/2015 16:23:46 Kamrowski, Onna AMarland Kitchen ([id number]) -------------------------------------------------------------------------------- HPI Details Patient Name: Shelia Lick A. 01/24/2015 3:30 Date of Service: PM Medical Record [id number] Number: Patient Account Number: [id number] 1956/01/11 (59 y.o. Treating RN: Cornell Barman Date of Birth/Sex: Female) Other Clinician: Primary Care Physician: [name] Treating Christin Fudge Referring Physician: [name] Physician/Extender: Weeks in Treatment: 6 History of Present Illness Location: left lower extremity Quality: Patient reports experiencing a dull pain to affected area(s). Severity: Patient states wound are getting worse. Duration: Patient has had the wound for < 4 weeks prior to presenting for treatment Timing: Pain in wound is Intermittent (comes and goes Context: The wound occurred when the patient had a fall and lacerated her left lower extremity about a month ago. She was sutured in the ER and sutures were removed in 15 days. Modifying Factors: Other treatment(s) tried include: he has been on doxycycline and clindamycin and has been applying local anti-biotic ointment over the wound Associated Signs and Symptoms: Patient reports having difficulty standing for long periods. HPI Description: this 59 year old patient had a lacerated wound to the left lower extremity about a month ago. This was sutured in the ER and the sutures were removed in about 2 weeks' time. The wound then got infected and gaped. She was seen in the urgent care a couple of days ago with an infection and was put on  doxycycline and clindamycin. She has been a smoker for several years and smokes about 10 cigarettes a day. Does not have diabetes mellitus. 12/17/2014 -- it has been a few days since she is given of smoking completely and I have commended her on this. 01/07/2015 -- has relapsed a bit with her smoking but has had several social issues and I have  encouraged her to continue working hard on her smoking cessation. She is going to be away on a cruise this coming week. Electronic Signature(s) Signed: 01/24/2015 4:24:01 PM By: Christin Fudge MD, FACS Entered By: Christin Fudge on 01/24/2015 16:24:01 [name] ([id number]) -------------------------------------------------------------------------------- Physical Exam Details Patient Name: Shelia Lick A. 01/24/2015 3:30 Date of Service: PM Medical Record [id number] Number: Patient Account Number: [id number] Oct 26, 1955 (59 y.o. Treating RN: Cornell Barman Date of Birth/Sex: Female) Other Clinician: Primary Care Physician: [name] Treating Christin Fudge Referring Physician: [name] Physician/Extender: Weeks in Treatment: 6 Constitutional . Pulse regular. Respirations normal and unlabored. Afebrile. . Eyes Nonicteric. Reactive to light. Ears, Nose, Mouth, and Throat Lips, teeth, and gums WNL.Marland Kitchen Moist mucosa without lesions . Neck supple and nontender. No palpable supraclavicular or cervical adenopathy. Normal sized without goiter. Respiratory WNL. No retractions.. Breath sounds WNL, No rubs, rales, rhonchi, or wheeze.. Cardiovascular Heart rhythm and rate regular, no murmur or gallop.. Pedal Pulses WNL. No clubbing, cyanosis or edema. Chest Breasts symmetical and no nipple discharge.. Breast tissue WNL, no masses, lumps, or tenderness.. Lymphatic No adneopathy. No adenopathy. No adenopathy. Musculoskeletal Adexa without tenderness or enlargement.. Digits and nails w/o clubbing, cyanosis, infection,  petechiae, ischemia, or inflammatory conditions.. Integumentary (Hair, Skin) No suspicious lesions. No crepitus or fluctuance. No peri-wound warmth or erythema. No masses.Marland Kitchen Psychiatric Judgement and insight Intact.. No evidence of depression, anxiety, or agitation.. Notes the wound looks excellent and except for a small area in the center of the wound which has depth and sloughed everything else is looking very clean. Sharp debridement was done with a toothed forcep. Electronic Signature(s) Signed: 01/24/2015 4:24:54 PM By: Christin Fudge MD, FACS Entered By: Christin Fudge on 01/24/2015 16:24:54 [name] ([id number]) -------------------------------------------------------------------------------- Physician Orders Details Patient Name: Shelia Lick A. 01/24/2015 3:30 Date of Service: PM Medical Record [id number] Number: Patient Account Number: [id number] 04-13-1955 (59 y.o. Treating RN: Cornell Barman Date of Birth/Sex: Female) Other Clinician: Primary Care Physician: [name] Treating Christin Fudge Referring Physician: [name] Physician/Extender: Weeks in Treatment: 6 Verbal / Phone Orders: No Diagnosis Coding Wound Cleansing Wound #1 Left,Medial Lower Leg o Clean wound with Normal Saline. Anesthetic Wound #1 Left,Medial Lower Leg o Topical Lidocaine 4% cream applied to wound bed prior to debridement Primary Wound Dressing Wound #1 Left,Medial Lower Leg o Medihoney gel Secondary Dressing Wound #1 Left,Medial Lower Leg o Boardered Foam Dressing Dressing Change Frequency Wound #1 Left,Medial Lower Leg o Change dressing every other day. Follow-up Appointments Wound #1 Left,Medial Lower Leg o Return Appointment in 2 weeks. Electronic Signature(s) Signed: 01/24/2015 4:42:27 PM By: Christin Fudge MD, FACS Signed: 01/28/2015 9:38:48 AM By: Alric Quan Entered By: Alric Quan on 01/24/2015 16:14:18 [place], Elisabetta Loni Muse  ([id number]) -------------------------------------------------------------------------------- Problem List Details Patient Name: CARRY, STEBELTON A. 01/24/2015 3:30 Date of Service: PM Medical Record [id number] Number: Patient Account Number: [id number] [birth year] (59 y.o. Treating RN: Cornell Barman Date of Birth/Sex: Female) Other Clinician: Primary Care Physician: [name] Treating Christin Fudge Referring Physician: [name] Physician/Extender: Suella [place] in Treatment: 6 Active Problems ICD-10 Encounter Code Description Active Date Diagnosis S81.812A Laceration without foreign body, left lower leg, initial 12/10/2014 Yes encounter L97.222 Non-pressure chronic ulcer of left calf with fat layer 12/10/2014 Yes exposed F17.219 Nicotine dependence, cigarettes, with unspecified 12/10/2014 Yes nicotine-induced disorders Inactive Problems Resolved Problems Electronic Signature(s) Signed: 01/24/2015 4:23:39 PM By: Christin Fudge MD, FACS Entered By: Christin Fudge on 01/24/2015 16:23:39 Kidney, Vidalia Loni Muse ([id number]) -------------------------------------------------------------------------------- Progress Note Details Patient Name: [name],  Lillyen A. 01/24/2015 3:30 Date of Service: PM Medical Record [id number] Number: Patient Account Number: [id number] May 10, 1955 (59 y.o. Treating RN: Cornell Barman Date of Birth/Sex: Female) Other Clinician: Primary Care Physician: [name] Treating Christin Fudge Referring Physician: [name] Physician/Extender: Suella [place] in Treatment: 6 Subjective Chief Complaint Information obtained from Patient Patient presents to the wound care center for a consult due non healing wound. she has had a wound on her left lower extremity for about a month History of Present Illness (HPI) The following HPI elements were documented for the patient's wound: Location: left lower extremity Quality: Patient reports experiencing a dull pain  to affected area(s). Severity: Patient states wound are getting worse. Duration: Patient has had the wound for < 4 weeks prior to presenting for treatment Timing: Pain in wound is Intermittent (comes and goes Context: The wound occurred when the patient had a fall and lacerated her left lower extremity about a month ago. She was sutured in the ER and sutures were removed in 15 days. Modifying Factors: Other treatment(s) tried include: he has been on doxycycline and clindamycin and has been applying local anti-biotic ointment over the wound Associated Signs and Symptoms: Patient reports having difficulty standing for long periods. this 59 year old patient had a lacerated wound to the left lower extremity about a month ago. This was sutured in the ER and the sutures were removed in about 2 weeks' time. The wound then got infected and gaped. She was seen in the urgent care a couple of days ago with an infection and was put on doxycycline and clindamycin. She has been a smoker for several years and smokes about 10 cigarettes a day. Does not have diabetes mellitus. 12/17/2014 -- it has been a few days since she is given of smoking completely and I have commended her on this. 01/07/2015 -- has relapsed a bit with her smoking but has had several social issues and I have encouraged her to continue working hard on her smoking cessation. She is going to be away on a cruise this coming week. Gombert, Greidy A. ([id number]) Objective Constitutional Pulse regular. Respirations normal and unlabored. Afebrile. Vitals Time Taken: 3:52 PM, Height: 67 in, Weight: 190 lbs, BMI: 29.8, Temperature: 97.9 F, Pulse: 75 bpm, Respiratory Rate: 20 breaths/min, Blood Pressure: 117/71 mmHg. Eyes Nonicteric. Reactive to light. Ears, Nose, Mouth, and Throat Lips, teeth, and gums WNL.Marland Kitchen Moist mucosa without lesions . Neck supple and nontender. No palpable supraclavicular or cervical adenopathy. Normal sized  without goiter. Respiratory WNL. No retractions.. Breath sounds WNL, No rubs, rales, rhonchi, or wheeze.. Cardiovascular Heart rhythm and rate regular, no murmur or gallop.. Pedal Pulses WNL. No clubbing, cyanosis or edema. Chest Breasts symmetical and no nipple discharge.. Breast tissue WNL, no masses, lumps, or tenderness.. Lymphatic No adneopathy. No adenopathy. No adenopathy. Musculoskeletal Adexa without tenderness or enlargement.. Digits and nails w/o clubbing, cyanosis, infection, petechiae, ischemia, or inflammatory conditions.Marland Kitchen Psychiatric Judgement and insight Intact.. No evidence of depression, anxiety, or agitation.. General Notes: the wound looks excellent and except for a small area in the center of the wound which has depth and sloughed everything else is looking very clean. Sharp debridement was done with a toothed forcep. Integumentary (Hair, Skin) No suspicious lesions. No crepitus or fluctuance. No peri-wound warmth or erythema. No masses.. Wound #1 status is Open. Original cause of wound was Trauma. The wound is located on the Left,Medial Lower Leg. The wound measures 0.4cm length x 0.7cm width x 0.2cm depth; 0.22cm^2 area and Arguijo,  Yoltzin A. ([id number]) 0.044cm^3 volume. The wound is limited to skin breakdown. There is no tunneling or undermining noted. There is a small amount of serous drainage noted. The wound margin is flat and intact. There is small (1-33%) pink granulation within the wound bed. There is a medium (34-66%) amount of necrotic tissue within the wound bed including Adherent Slough. The periwound skin appearance exhibited: Scarring, Moist, Hemosiderin Staining. The periwound skin appearance did not exhibit: Callus, Crepitus, Excoriation, Fluctuance, Friable, Induration, Localized Edema, Rash, Dry/Scaly, Maceration, Atrophie Blanche, Cyanosis, Ecchymosis, Mottled, Pallor, Rubor, Erythema. Periwound temperature was noted as No Abnormality. The  periwound has tenderness on palpation. Assessment Active Problems ICD-10 S81.812A - Laceration without foreign body, left lower leg, initial encounter L97.222 - Non-pressure chronic ulcer of left calf with fat layer exposed F17.219 - Nicotine dependence, cigarettes, with unspecified nicotine-induced disorders Procedures Wound #1 Wound #1 is a Trauma, Other located on the Left,Medial Lower Leg . There was a Skin/Subcutaneous Tissue Debridement [id number]) debridement with total area of 0.28 sq cm performed by Christin Fudge, MD. with the following instrument(s): Forceps including Fibrin/Slough and Subcutaneous after achieving pain control using Other (4% lidocaine cream). A time out was conducted prior to the start of the procedure. A Minimum amount of bleeding was controlled with Pressure. The procedure was tolerated well with a pain level of 2 throughout and a pain level of 0 following the procedure. Post Debridement Measurements: 0.4cm length x 0.7cm width x 0.3cm depth; 0.066cm^3 volume. Post procedure Diagnosis Wound #1: Same as Pre-Procedure Plan Wound Cleansing: Wound #1 Left,Medial Lower Leg: Clean wound with Normal Saline. Anesthetic: SURIYA, BALDY A. ([id number]) Wound #1 Left,Medial Lower Leg: Topical Lidocaine 4% cream applied to wound bed prior to debridement Primary Wound Dressing: Wound #1 Left,Medial Lower Leg: Medihoney gel Secondary Dressing: Wound #1 Left,Medial Lower Leg: Boardered Foam Dressing Dressing Change Frequency: Wound #1 Left,Medial Lower Leg: Change dressing every other day. Follow-up Appointments: Wound #1 Left,Medial Lower Leg: Return Appointment in 2 weeks. Overall the progress is good and we will continue with medihoney and local dressing as before. She will come back and see me next week. Electronic Signature(s) Signed: 01/24/2015 4:25:28 PM By: Christin Fudge MD, FACS Entered By: Christin Fudge on 01/24/2015 16:25:28 [place], Kairy Loni Muse  ([id number]) -------------------------------------------------------------------------------- SuperBill Details Patient Name: [name], Camisha A. Date of Service: 01/24/2015 Medical Record Number: [id number] Patient Account Number: [id number] Date of Birth/Sex: [birth year] (59 y.o. Female) Treating RN: Cornell Barman Primary Care Physician: [name] Other Clinician: Referring Physician: [name] Treating Physician/Extender: Frann Rider in Treatment: 6 Diagnosis Coding ICD-10 Codes Code Description [phone] Laceration without foreign body, left lower leg, initial encounter L97.222 Non-pressure chronic ulcer of left calf with fat layer exposed F17.219 Nicotine dependence, cigarettes, with unspecified nicotine-induced disorders Facility Procedures CPT4: Description Modifier Quantity Code [id number] - DEB SUBQ TISSUE 20 SQ CM/< 1 ICD-10 Description Diagnosis S81.812A Laceration without foreign body, left lower leg, initial encounter L97.222 Non-pressure chronic ulcer of left calf with fat layer  exposed F17.219 Nicotine dependence, cigarettes, with unspecified nicotine-induced disorders Physician Procedures CPT4: Description Modifier Quantity Code [id number] - WC PHYS SUBQ TISS 20 SQ CM 1 ICD-10 Description Diagnosis S81.812A Laceration without foreign body, left lower leg, initial encounter L97.222 Non-pressure chronic ulcer of left calf with fat layer  exposed F17.219 Nicotine dependence, cigarettes, with unspecified nicotine-induced disorders Electronic Signature(s) Signed: 01/24/2015 4:25:37 PM By: Christin Fudge MD, FACS Entered By: Christin Fudge on 01/24/2015 16:25:37

## 2015-01-28 NOTE — Progress Notes (Signed)
KENEESHA, QUEST ([id number]) Visit Report for 01/24/2015 Arrival Information Details Patient Name: [name], [name] Date of Service: 01/24/2015 3:30 PM Medical Record Number: [id number] Patient Account Number: [id number] Date of Birth/Sex: 07/14/1955 (59 y.o. Female) Treating RN: Cornell Barman Primary Care Physician: [name] Other Clinician: Referring Physician: [name] Treating Physician/Extender: Frann Rider in Treatment: 6 Visit Information History Since Last Visit All ordered tests and consults were completed: No Patient Arrived: Ambulatory Added or deleted any medications: No Arrival Time: 15:51 Any new allergies or adverse reactions: No Accompanied By: self Had a fall or experienced change in No Transfer Assistance: None activities of daily living that may affect Patient Identification Verified: Yes risk of falls: Secondary Verification Process Yes Signs or symptoms of abuse/neglect since last No Completed: visito Patient Requires Transmission-Based No Hospitalized since last visit: No Precautions: Pain Present Now: No Patient Has Alerts: Yes Electronic Signature(s) Signed: 01/28/2015 9:38:48 AM By: Alric Quan Entered By: Alric Quan on 01/24/2015 15:52:26 Calvin, Melane AMarland Kitchen ([id number]) -------------------------------------------------------------------------------- Encounter Discharge Information Details Patient Name: [name], [name] Date of Service: 01/24/2015 3:30 PM Medical Record Number: [id number] Patient Account Number: [id number] Date of Birth/Sex: 04/15/1955 (59 y.o. Female) Treating RN: Cornell Barman Primary Care Physician: [name] Other Clinician: Referring Physician: [name] Treating Physician/Extender: Frann Rider in Treatment: 6 Encounter Discharge Information Items Discharge Pain Level: 0 Discharge Condition: Stable Ambulatory Status: Ambulatory Discharge Destination:  Home Transportation: Private Auto Accompanied By: self Schedule Follow-up Appointment: Yes Medication Reconciliation completed and provided to Patient/Care No Mechell Girgis: Provided on Clinical Summary of Care: 01/24/2015 Form Type Recipient Paper Patient CM Electronic Signature(s) Signed: 01/28/2015 9:38:48 AM By: Alric Quan Previous Signature: 01/24/2015 4:16:41 PM Version By: Ruthine Dose Entered By: Alric Quan on 01/24/2015 16:22:55 [place], [place] ([id number]) -------------------------------------------------------------------------------- Lower Extremity Assessment Details Patient Name: [name], [name] Date of Service: 01/24/2015 3:30 PM Medical Record Number: [id number] Patient Account Number: [id number] Date of Birth/Sex: 06-17-1955 (59 y.o. Female) Treating RN: Cornell Barman Primary Care Physician: [name] Other Clinician: Referring Physician: [name] Treating Physician/Extender: Frann Rider in Treatment: 6 Vascular Assessment Pulses: Posterior Tibial Dorsalis Pedis Palpable: [Left:Yes] [Right:Yes] Extremity colors, hair growth, and conditions: Extremity Color: [Left:Normal] Hair Growth on Extremity: [Left:Yes] Temperature of Extremity: [Left:Warm] Capillary Refill: [Left:< 3 seconds] Dependent Rubor: [Left:No] Toe Nail Assessment Left: Right: Thick: No Discolored: No Deformed: No Improper Length and Hygiene: No Electronic Signature(s) Signed: 01/24/2015 5:40:12 PM By: Gretta Cool RN, BSN, Kim RN, BSN Signed: 01/28/2015 9:38:48 AM By: Alric Quan Entered By: Alric Quan on 01/24/2015 15:56:25 Sweetser, Janissa A. ([id number]) -------------------------------------------------------------------------------- Multi Wound Chart Details Patient Name: [name], [name] Date of Service: 01/24/2015 3:30 PM Medical Record Number: [id number] Patient Account Number: [id number] Date of Birth/Sex: 02/03/1956 (59 y.o.  Female) Treating RN: Cornell Barman Primary Care Physician: [name] Other Clinician: Referring Physician: [name] Treating Physician/Extender: Frann Rider in Treatment: 6 Vital Signs Height(in): 67 Pulse(bpm): 75 Weight(lbs): 190 Blood Pressure 117/71 (mmHg): Body Mass Index(BMI): 30 Temperature(F): 97.9 Respiratory Rate 20 (breaths/min): Photos: [1:No Photos] [N/A:N/A] Wound Location: [1:Left Lower Leg - Medial] [N/A:N/A] Wounding Event: [1:Trauma] [N/A:N/A] Primary Etiology: [1:Trauma, Other] [N/A:N/A] Comorbid History: [1:Seizure Disorder] [N/A:N/A] Date Acquired: [1:11/09/2014] [N/A:N/A] Weeks of Treatment: [1:6] [N/A:N/A] Wound Status: [1:Open] [N/A:N/A] Measurements L x W x D 0.4x0.7x0.2 [N/A:N/A] (cm) Area (cm) : [1:0.22] [N/A:N/A] Volume (cm) : [1:0.044] [N/A:N/A] % Reduction in Area: [1:92.00%] [N/A:N/A] % Reduction in Volume: 92.00% [N/A:N/A] Classification: [1:Full Thickness Without Exposed Support Structures] [N/A:N/A] Exudate Amount: [  1:Small] [N/A:N/A] Exudate Type: [1:Serous] [N/A:N/A] Exudate Color: [1:amber] [N/A:N/A] Wound Margin: [1:Flat and Intact] [N/A:N/A] Granulation Amount: [1:Small (1-33%)] [N/A:N/A] Granulation Quality: [1:Pink] [N/A:N/A] Necrotic Amount: [1:Medium (34-66%)] [N/A:N/A] Exposed Structures: [1:Fascia: No Fat: No Tendon: No Muscle: No Joint: No Bone: No] [N/A:N/A] Limited to Skin Breakdown Epithelialization: Small (1-33%) N/A N/A Periwound Skin Texture: Scarring: Yes N/A N/A Edema: No Excoriation: No Induration: No Callus: No Crepitus: No Fluctuance: No Friable: No Rash: No Periwound Skin Moist: Yes N/A N/A Moisture: Maceration: No Dry/Scaly: No Periwound Skin Color: Hemosiderin Staining: Yes N/A N/A Atrophie Blanche: No Cyanosis: No Ecchymosis: No Erythema: No Mottled: No Pallor: No Rubor: No Temperature: No Abnormality N/A N/A Tenderness on Yes N/A N/A Palpation: Wound  Preparation: Ulcer Cleansing: N/A N/A Rinsed/Irrigated with Saline Topical Anesthetic Applied: Other: lidociane 4% Treatment Notes Electronic Signature(s) Signed: 01/28/2015 9:38:48 AM By: Alric Quan Entered By: Alric Quan on 01/24/2015 15:59:19 Nawabi, Reema Loni Muse ([id number]) -------------------------------------------------------------------------------- [facility] Details Patient Name: [name], Maiko A. Date of Service: 01/24/2015 3:30 PM Medical Record Number: [id number] Patient Account Number: [id number] Date of Birth/Sex: [birth year] (59 y.o. Female) Treating RN: Cornell Barman Primary Care Physician: [name] Other Clinician: Referring Physician: [name] Treating Physician/Extender: Frann Rider in Treatment: 6 Active Inactive Orientation to the Wound Care Program Nursing Diagnoses: Knowledge deficit related to the wound healing center program Goals: Patient/caregiver will verbalize understanding of the [facility] Program Date Initiated: 12/10/2014 Goal Status: Active Interventions: Provide education on orientation to the wound center Notes: Wound/Skin Impairment Nursing Diagnoses: Impaired tissue integrity Goals: Ulcer/skin breakdown will heal within 14 weeks Date Initiated: 12/10/2014 Goal Status: Active Interventions: Assess patient/caregiver ability to obtain necessary supplies Treatment Activities: Skin care regimen initiated : 01/24/2015 Notes: Electronic Signature(s) Signed: 01/24/2015 5:40:12 PM By: Gretta Cool RN, BSN, Kim RN, BSN Signed: 01/28/2015 9:38:48 AM By: Alric Quan Entered By: Alric Quan on 01/24/2015 15:59:06 Channing, Roshanda A. ([id number]) Haese, Wanette A. ([id number]) -------------------------------------------------------------------------------- Pain Assessment Details Patient Name: [name], Weda A. Date of Service: 01/24/2015 3:30 PM Medical Record  Number: [id number] Patient Account Number: [id number] Date of Birth/Sex: 10-14-1955 (59 y.o. Female) Treating RN: Cornell Barman Primary Care Physician: [name] Other Clinician: Referring Physician: [name] Treating Physician/Extender: Frann Rider in Treatment: 6 Active Problems Location of Pain Severity and Description of Pain Patient Has Paino No Site Locations Rate the pain. Current Pain Level: 0 Worst Pain Level: 6 Least Pain Level: 0 Character of Pain Describe the Pain: Shooting Pain Management and Medication Current Pain Management: Electronic Signature(s) Signed: 01/24/2015 5:40:12 PM By: Gretta Cool RN, BSN, Kim RN, BSN Signed: 01/28/2015 9:38:48 AM By: Alric Quan Entered By: Alric Quan on 01/24/2015 15:53:05 [place], Mikiya AMarland Kitchen ([id number]) -------------------------------------------------------------------------------- Patient/Caregiver Education Details Patient Name: [name], Jaleena A. Date of Service: 01/24/2015 3:30 PM Medical Record Number: [id number] Patient Account Number: [id number] Date of Birth/Gender: Jul 08, 1955 (59 y.o. Female) Treating RN: Cornell Barman Primary Care Physician: [name] Other Clinician: Referring Physician: [name] Treating Physician/Extender: Frann Rider in Treatment: 6 Education Assessment Education Provided To: Patient Education Topics Provided Wound/Skin Impairment: Handouts: Other: continue wound care as prescribed Methods: Demonstration, Explain/Verbal Responses: State content correctly Electronic Signature(s) Signed: 01/28/2015 9:38:48 AM By: Alric Quan Entered By: Alric Quan on 01/24/2015 16:23:38 [place], Teddy AMarland Kitchen ([id number]) -------------------------------------------------------------------------------- Wound Assessment Details Patient Name: [name], Lundon A. Date of Service: 01/24/2015 3:30 PM Medical Record Number: [id number] Patient Account  Number: [id number] Date of Birth/Sex: 04-29-1955 (59 y.o. Female) Treating RN: Cornell Barman Primary Care Physician: [facility],  [name] Saxon Other Clinician: Referring Physician: [name] Treating Physician/Extender: Frann Rider in Treatment: 6 Wound Status Wound Number: 1 Primary Etiology: Trauma, Other Wound Location: Left Lower Leg - Medial Wound Status: Open Wounding Event: Trauma Comorbid History: Seizure Disorder Date Acquired: 11/09/2014 Weeks Of Treatment: 6 Clustered Wound: No Photos Photo Uploaded By: Gretta Cool, RN, BSN, Kim on 01/24/2015 17:30:22 Wound Measurements Length: (cm) 0.4 % Reduction in Width: (cm) 0.7 % Reduction in Depth: (cm) 0.2 Epithelializati Area: (cm) 0.22 Tunneling: Volume: (cm) 0.044 Undermining: Area: 92% Volume: 92% on: Small (1-33%) No No Wound Description Full Thickness Without Exposed Foul Odor A Classification: Support Structures Wound Margin: Flat and Intact Exudate Small Amount: Exudate Type: Serous Exudate Color: amber fter Cleansing: No Wound Bed Granulation Amount: Small (1-33%) Exposed Structure Granulation Quality: Pink Fascia Exposed: No Necrotic Amount: Medium (34-66%) Fat Layer Exposed: No Homeyer, Jeymi A. ([id number]) Necrotic Quality: Adherent Slough Tendon Exposed: No Muscle Exposed: No Joint Exposed: No Bone Exposed: No Limited to Skin Breakdown Periwound Skin Texture Texture Color No Abnormalities Noted: No No Abnormalities Noted: No Callus: No Atrophie Blanche: No Crepitus: No Cyanosis: No Excoriation: No Ecchymosis: No Fluctuance: No Erythema: No Friable: No Hemosiderin Staining: Yes Induration: No Mottled: No Localized Edema: No Pallor: No Rash: No Rubor: No Scarring: Yes Temperature / Pain Moisture Temperature: No Abnormality No Abnormalities Noted: No Tenderness on Palpation: Yes Dry / Scaly: No Maceration: No Moist: Yes Wound Preparation Ulcer Cleansing:  Rinsed/Irrigated with Saline Topical Anesthetic Applied: Other: lidociane 4%, Treatment Notes Wound #1 (Left, Medial Lower Leg) 1. Cleansed with: Clean wound with Normal Saline 2. Anesthetic Topical Lidocaine 4% cream to wound bed prior to debridement 4. Dressing Applied: Medihoney Gel 5. Secondary Dressing Applied Bordered Foam Dressing Electronic Signature(s) Signed: 01/24/2015 5:40:12 PM By: Gretta Cool RN, BSN, Kim RN, BSN Signed: 01/28/2015 9:38:48 AM By: Alric Quan Entered By: Alric Quan on 01/24/2015 15:57:46 Ackert, Hodaya AMarland Kitchen ([id number]) -------------------------------------------------------------------------------- [facility] Details Patient Name: [name], Makayle A. Date of Service: 01/24/2015 3:30 PM Medical Record Number: [id number] Patient Account Number: [id number] Date of Birth/Sex: 02-May-1955 (59 y.o. Female) Treating RN: Cornell Barman Primary Care Physician: [name] Other Clinician: Referring Physician: [name] Treating Physician/Extender: Frann Rider in Treatment: 6 Vital Signs Time Taken: 15:52 Temperature (F): 97.9 Height (in): 67 Pulse (bpm): 75 Weight (lbs): 190 Respiratory Rate (breaths/min): 20 Body Mass Index (BMI): 29.8 Blood Pressure (mmHg): 117/71 Reference Range: 80 - 120 mg / dl Electronic Signature(s) Signed: 01/28/2015 9:38:48 AM By: Alric Quan Entered By: Alric Quan on 01/24/2015 15:53:41

## 2015-02-03 ENCOUNTER — Encounter: Payer: BLUE CROSS/BLUE SHIELD | Admitting: Surgery

## 2015-02-03 DIAGNOSIS — L97222 Non-pressure chronic ulcer of left calf with fat layer exposed: Secondary | ICD-10-CM | POA: Diagnosis not present

## 2015-02-03 NOTE — Progress Notes (Addendum)
SUDA, WILLS ([id number]) Visit Report for 02/03/2015 Chief Complaint Document Details Patient Name: [name], [name] 02/03/2015 10:15 Date of Service: AM Medical Record [id number] Number: Patient Account Number: [id number] 05/01/1955 (59 y.o. Treating RN: Montey Hora Date of Birth/Sex: Female) Other Clinician: Primary Care Treating [place], Leslye Peer, [place] Physician: Physician/Extender: Referring Physician: Imelda Pillow in Treatment: 7 Information Obtained from: Patient Chief Complaint Patient presents to the wound care center for a consult due non healing wound. she has had a wound on her left lower extremity for about a month Electronic Signature(s) Signed: 02/03/2015 10:44:23 AM By: Christin Fudge MD, FACS Entered By: Christin Fudge on 02/03/2015 10:44:23 Yerkes, Deatrice Loni Muse ([id number]) -------------------------------------------------------------------------------- HPI Details Patient Name: Shelia Lick A. 02/03/2015 10:15 Date of Service: AM Medical Record [id number] Number: Patient Account Number: [id number] 07-20-1955 (59 y.o. Treating RN: Montey Hora Date of Birth/Sex: Female) Other Clinician: Primary Care Treating [place], Leslye Peer, [place] Physician: Physician/Extender: Referring Physician: Imelda Pillow in Treatment: 7 History of Present Illness Location: left lower extremity Quality: Patient reports experiencing a dull pain to affected area(s). Severity: Patient states wound are getting worse. Duration: Patient has had the wound for < 4 weeks prior to presenting for treatment Timing: Pain in wound is Intermittent (comes and goes Context: The wound occurred when the patient had a fall and lacerated her left lower extremity about a month ago. She was sutured in the ER and sutures were removed in 15 days. Modifying Factors: Other treatment(s) tried include: he has been on doxycycline and clindamycin and  has been applying local anti-biotic ointment over the wound Associated Signs and Symptoms: Patient reports having difficulty standing for long periods. HPI Description: this 59 year old patient had a lacerated wound to the left lower extremity about a month ago. This was sutured in the ER and the sutures were removed in about 2 weeks' time. The wound then got infected and gaped. She was seen in the urgent care a couple of days ago with an infection and was put on doxycycline and clindamycin. She has been a smoker for several years and smokes about 10 cigarettes a day. Does not have diabetes mellitus. 12/17/2014 -- it has been a few days since she is given of smoking completely and I have commended her on this. 01/07/2015 -- has relapsed a bit with her smoking but has had several social issues and I have encouraged her to continue working hard on her smoking cessation. She is going to be away on a cruise this coming week. Electronic Signature(s) Signed: 02/03/2015 10:44:31 AM By: Christin Fudge MD, FACS Entered By: Christin Fudge on 02/03/2015 10:44:31 [name] ([id number]) -------------------------------------------------------------------------------- Physical Exam Details Patient Name: Shelia Lick A. 02/03/2015 10:15 Date of Service: AM Medical Record [id number] Number: Patient Account Number: [id number] [birth year] (59 y.o. Treating RN: Montey Hora Date of Birth/Sex: Female) Other Clinician: Primary Care Treating [place], Leslye Peer, [place] Physician: Physician/Extender: Referring Physician: Imelda Pillow in Treatment: 7 Constitutional . Pulse regular. Respirations normal and unlabored. Afebrile. . Eyes Nonicteric. Reactive to light. Ears, Nose, Mouth, and Throat Lips, teeth, and gums WNL.Marland Kitchen Moist mucosa without lesions . Neck supple and nontender. No palpable supraclavicular or cervical adenopathy. Normal sized without  goiter. Respiratory WNL. No retractions.. Cardiovascular Pedal Pulses WNL. No clubbing, cyanosis or edema. Lymphatic No adneopathy. No adenopathy. No adenopathy. Musculoskeletal Adexa without tenderness or enlargement.. Digits and nails w/o clubbing, cyanosis, infection, petechiae, ischemia, or inflammatory conditions.. Integumentary (Hair, Skin) No suspicious lesions. No crepitus or fluctuance.  No peri-wound warmth or erythema. No masses.Marland Kitchen Psychiatric Judgement and insight Intact.. No evidence of depression, anxiety, or agitation.. Notes the wound is now tiny and there is no debris. Electronic Signature(s) Signed: 02/03/2015 10:44:55 AM By: Christin Fudge MD, FACS Entered By: Christin Fudge on 02/03/2015 10:44:55 [name] ([id number]) -------------------------------------------------------------------------------- Physician Orders Details Patient Name: Shelia Lick A. 02/03/2015 10:15 Date of Service: AM Medical Record [id number] Number: Patient Account Number: [id number] [birth year] (59 y.o. Treating RN: Ahmed Prima Date of Birth/Sex: Female) Other Clinician: Primary Care Treating [place], Leslye Peer, Jennalee Greaves Physician: Physician/Extender: Referring Physician: Imelda Pillow in Treatment: 7 Verbal / Phone Orders: Yes Clinician: Pinkerton, Debi Read Back and Verified: Yes Diagnosis Coding Wound Cleansing Wound #1 Left,Medial Lower Leg o Clean wound with Normal Saline. Anesthetic Wound #1 Left,Medial Lower Leg o Topical Lidocaine 4% cream applied to wound bed prior to debridement Primary Wound Dressing Wound #1 Left,Medial Lower Leg o Prisma Ag Secondary Dressing Wound #1 Left,Medial Lower Leg o Boardered Foam Dressing Dressing Change Frequency Wound #1 Left,Medial Lower Leg o Change dressing every day. Follow-up Appointments Wound #1 Left,Medial Lower Leg o Return Appointment in 1 week. Electronic Signature(s) Signed:  02/03/2015 4:26:49 PM By: Christin Fudge MD, FACS Signed: 02/03/2015 5:02:22 PM By: Alric Quan Entered By: Alric Quan on 02/03/2015 10:44:30 [name] ([id number]) -------------------------------------------------------------------------------- Problem List Details Patient Name: BRENESHA, GOVEIA A. 02/03/2015 10:15 Date of Service: AM Medical Record [id number] Number: Patient Account Number: [id number] [birth year] (59 y.o. Treating RN: Montey Hora Date of Birth/Sex: Female) Other Clinician: Primary Care Treating [place], Leslye Peer, Keo Schirmer Physician: Physician/Extender: Referring Physician: Imelda Pillow in Treatment: 7 Active Problems ICD-10 Encounter Code Description Active Date Diagnosis S81.812A Laceration without foreign body, left lower leg, initial 12/10/2014 Yes encounter L97.222 Non-pressure chronic ulcer of left calf with fat layer 12/10/2014 Yes exposed F17.219 Nicotine dependence, cigarettes, with unspecified 12/10/2014 Yes nicotine-induced disorders Inactive Problems Resolved Problems Electronic Signature(s) Signed: 02/03/2015 10:44:17 AM By: Christin Fudge MD, FACS Entered By: Christin Fudge on 02/03/2015 10:44:17 [name] ([id number]) -------------------------------------------------------------------------------- Progress Note Details Patient Name: Shelia Lick A. 02/03/2015 10:15 Date of Service: AM Medical Record [id number] Number: Patient Account Number: [id number] August 26, 1955 (59 y.o. Treating RN: Montey Hora Date of Birth/Sex: Female) Other Clinician: Primary Care Treating [place], Leslye Peer, [place] Physician: Physician/Extender: Referring Physician: Imelda Pillow in Treatment: 7 Subjective Chief Complaint Information obtained from Patient Patient presents to the wound care center for a consult due non healing wound. she has had a wound on her left lower extremity for about a  month History of Present Illness (HPI) The following HPI elements were documented for the patient's wound: Location: left lower extremity Quality: Patient reports experiencing a dull pain to affected area(s). Severity: Patient states wound are getting worse. Duration: Patient has had the wound for < 4 weeks prior to presenting for treatment Timing: Pain in wound is Intermittent (comes and goes Context: The wound occurred when the patient had a fall and lacerated her left lower extremity about a month ago. She was sutured in the ER and sutures were removed in 15 days. Modifying Factors: Other treatment(s) tried include: he has been on doxycycline and clindamycin and has been applying local anti-biotic ointment over the wound Associated Signs and Symptoms: Patient reports having difficulty standing for long periods. this 59 year old patient had a lacerated wound to the left lower extremity about a month ago. This was sutured in the ER and the sutures were removed in about 2 weeks'  time. The wound then got infected and gaped. She was seen in the urgent care a couple of days ago with an infection and was put on doxycycline and clindamycin. She has been a smoker for several years and smokes about 10 cigarettes a day. Does not have diabetes mellitus. 12/17/2014 -- it has been a few days since she is given of smoking completely and I have commended her on this. 01/07/2015 -- has relapsed a bit with her smoking but has had several social issues and I have encouraged her to continue working hard on her smoking cessation. She is going to be away on a cruise this coming week. Clevenger, Blia A. ([id number]) Objective Constitutional Pulse regular. Respirations normal and unlabored. Afebrile. Vitals Time Taken: 10:26 AM, Height: 67 in, Weight: 190 lbs, BMI: 29.8, Temperature: 98.0 F, Pulse: 79 bpm, Respiratory Rate: 20 breaths/min, Blood Pressure: 99/68 mmHg. Eyes Nonicteric. Reactive to  light. Ears, Nose, Mouth, and Throat Lips, teeth, and gums WNL.Marland Kitchen Moist mucosa without lesions . Neck supple and nontender. No palpable supraclavicular or cervical adenopathy. Normal sized without goiter. Respiratory WNL. No retractions.. Cardiovascular Pedal Pulses WNL. No clubbing, cyanosis or edema. Lymphatic No adneopathy. No adenopathy. No adenopathy. Musculoskeletal Adexa without tenderness or enlargement.. Digits and nails w/o clubbing, cyanosis, infection, petechiae, ischemia, or inflammatory conditions.Marland Kitchen Psychiatric Judgement and insight Intact.. No evidence of depression, anxiety, or agitation.. General Notes: the wound is now tiny and there is no debris. Integumentary (Hair, Skin) No suspicious lesions. No crepitus or fluctuance. No peri-wound warmth or erythema. No masses.. Wound #1 status is Open. Original cause of wound was Trauma. The wound is located on the Left,Medial Lower Leg. The wound measures 0.4cm length x 0.4cm width x 0.2cm depth; 0.126cm^2 area and 0.025cm^3 volume. The wound is limited to skin breakdown. There is no tunneling or undermining noted. There is a small amount of serous drainage noted. The wound margin is flat and intact. There is small (1-33%) pink granulation within the wound bed. There is a small (1-33%) amount of necrotic tissue within the wound bed including Adherent Slough. The periwound skin appearance exhibited: Scarring, Dry/Scaly, Hemosiderin Staining. The periwound skin appearance did not exhibit: Callus, Crepitus, Excoriation, Louis, Reine A. ([id number]) Fluctuance, Friable, Induration, Localized Edema, Rash, Maceration, Moist, Atrophie Blanche, Cyanosis, Ecchymosis, Mottled, Pallor, Rubor, Erythema. Periwound temperature was noted as No Abnormality. The periwound has tenderness on palpation. Assessment Active Problems ICD-10 S81.812A - Laceration without foreign body, left lower leg, initial encounter L97.222 - Non-pressure  chronic ulcer of left calf with fat layer exposed F17.219 - Nicotine dependence, cigarettes, with unspecified nicotine-induced disorders I have asked her to stop the medihoney and now apply silver collagen and aborted for him and change it every other day. She is again encouraged to give up smoking completely and she will come back and see as next week. Plan Wound Cleansing: Wound #1 Left,Medial Lower Leg: Clean wound with Normal Saline. Anesthetic: Wound #1 Left,Medial Lower Leg: Topical Lidocaine 4% cream applied to wound bed prior to debridement Primary Wound Dressing: Wound #1 Left,Medial Lower Leg: Prisma Ag Secondary Dressing: Wound #1 Left,Medial Lower Leg: Boardered Foam Dressing Dressing Change Frequency: Wound #1 Left,Medial Lower Leg: Change dressing every day. Follow-up Appointments: Wound #1 Left,Medial Lower Leg: Return Appointment in 1 week. Strassman, Tasheka A. ([id number]) I have asked her to stop the medihoney and now apply silver collagen and aborted for him and change it every other day. She is again encouraged to give up smoking completely and  she will come back and see as next week. Electronic Signature(s) Signed: 02/03/2015 10:45:41 AM By: Christin Fudge MD, FACS Entered By: Christin Fudge on 02/03/2015 10:45:41 Eduardo, Arah Loni Muse ([id number]) -------------------------------------------------------------------------------- SuperBill Details Patient Name: [name], Shahana A. Date of Service: 02/03/2015 Medical Record Number: [id number] Patient Account Number: [id number] Date of Birth/Sex: 1955/03/14 (59 y.o. Female) Treating RN: Montey Hora Primary Care Physician: [name] Other Clinician: Referring Physician: [name] Treating Physician/Extender: Frann Rider in Treatment: 7 Diagnosis Coding ICD-10 Codes Code Description [phone] Laceration without foreign body, left lower leg, initial encounter L97.222 Non-pressure  chronic ulcer of left calf with fat layer exposed F17.219 Nicotine dependence, cigarettes, with unspecified nicotine-induced disorders Facility Procedures CPT4 Code: [id number] Description: [phone] - WOUND CARE VISIT-LEV 2 EST PT Modifier: Quantity: 1 Physician Procedures CPT4: Description Modifier Quantity Code [id number] - WC PHYS LEVEL 3 - EST PT 1 ICD-10 Description Diagnosis L97.222 Non-pressure chronic ulcer of left calf with fat layer exposed S81.812A Laceration without foreign body, left lower leg, initial  encounter F17.219 Nicotine dependence, cigarettes, with unspecified nicotine-induced disorders Electronic Signature(s) Signed: 02/03/2015 10:45:54 AM By: Christin Fudge MD, FACS Entered By: Christin Fudge on 02/03/2015 10:45:53

## 2015-02-04 NOTE — Progress Notes (Signed)
ELLIEROSE, HAILU ([id number]) Visit Report for 02/03/2015 Arrival Information Details Patient Name: ARLINDA, COTTOM 02/03/2015 10:15 Date of Service: AM Medical Record [id number] Number: Patient Account Number: [id number] September 26, 1955 (59 y.o. Treating RN: Ahmed Prima Date of Birth/Sex: Female) Other Clinician: Primary Care Physician: Brain Hilts, Errol Referring Physician: [name] Physician/Extender: Weeks in Treatment: 7 Visit Information History Since Last Visit All ordered tests and consults were completed: No Patient Arrived: Ambulatory Added or deleted any medications: No Arrival Time: 10:26 Any new allergies or adverse reactions: No Accompanied By: self Had a fall or experienced change in No Transfer Assistance: None activities of daily living that may affect Patient Identification Verified: Yes risk of falls: Secondary Verification Process Yes Signs or symptoms of abuse/neglect since last No Completed: visito Patient Requires Transmission-Based No Hospitalized since last visit: No Precautions: Pain Present Now: No Patient Has Alerts: Yes Electronic Signature(s) Signed: 02/03/2015 5:02:22 PM By: Alric Quan Entered By: Alric Quan on 02/03/2015 10:26:32 [name] ([id number]) -------------------------------------------------------------------------------- Clinic Level of Care Assessment Details Patient Name: [name] 02/03/2015 10:15 Date of Service: AM Medical Record [id number] Number: Patient Account Number: [id number] Mar 13, 1955 (59 y.o. Treating RN: Ahmed Prima Date of Birth/Sex: Female) Other Clinician: Primary Care Physician: Brain Hilts, Errol Referring Physician: [name] Physician/Extender: Weeks in Treatment: 7 Clinic Level of Care Assessment Items TOOL 4 Quantity Score []  - Use when only an EandM is performed on FOLLOW-UP visit  0 ASSESSMENTS - Nursing Assessment / Reassessment X - Reassessment of Co-morbidities (includes updates in patient status) 1 10 []  - Reassessment of Adherence to Treatment Plan 0 ASSESSMENTS - Wound and Skin Assessment / Reassessment X - Simple Wound Assessment / Reassessment - one wound 1 5 []  - Complex Wound Assessment / Reassessment - multiple wounds 0 []  - Dermatologic / Skin Assessment (not related to wound area) 0 ASSESSMENTS - Focused Assessment []  - Circumferential Edema Measurements - multi extremities 0 []  - Nutritional Assessment / Counseling / Intervention 0 []  - Lower Extremity Assessment (monofilament, tuning fork, pulses) 0 []  - Peripheral Arterial Disease Assessment (using hand held doppler) 0 ASSESSMENTS - Ostomy and/or Continence Assessment and Care []  - Incontinence Assessment and Management 0 []  - Ostomy Care Assessment and Management (repouching, etc.) 0 PROCESS - Coordination of Care X - Simple Patient / Family Education for ongoing care 1 15 []  - Complex (extensive) Patient / Family Education for ongoing care 0 []  - Staff obtains Programmer, systems, Records, Test Results / Process Orders 0 []  - Staff telephones HHA, Nursing Homes / Clarify orders / etc 0 Lame, Demiyah A. ([id number]) []  - Routine Transfer to another Facility (non-emergent condition) 0 []  - Routine Hospital Admission (non-emergent condition) 0 []  - New Admissions / Biomedical engineer / Ordering NPWT, Apligraf, etc. 0 []  - Emergency Hospital Admission (emergent condition) 0 X - Simple Discharge Coordination 1 10 []  - Complex (extensive) Discharge Coordination 0 PROCESS - Special Needs []  - Pediatric / Minor Patient Management 0 []  - Isolation Patient Management 0 []  - Hearing / Language / Visual special needs 0 []  - Assessment of Community assistance (transportation, D/C planning, etc.) 0 []  - Additional assistance / Altered mentation 0 []  - Support Surface(s) Assessment (bed, cushion, seat, etc.)  0 INTERVENTIONS - Wound Cleansing / Measurement X - Simple Wound Cleansing - one wound 1 5 []  - Complex Wound Cleansing - multiple wounds 0 X - Wound Imaging (photographs - any number of wounds) 1 5 []  - Wound  Tracing (instead of photographs) 0 X - Simple Wound Measurement - one wound 1 5 []  - Complex Wound Measurement - multiple wounds 0 INTERVENTIONS - Wound Dressings X - Small Wound Dressing one or multiple wounds 1 10 []  - Medium Wound Dressing one or multiple wounds 0 []  - Large Wound Dressing one or multiple wounds 0 []  - Application of Medications - topical 0 []  - Application of Medications - injection 0 Kendall, Lilyahna A. ([id number]) INTERVENTIONS - Miscellaneous []  - External ear exam 0 []  - Specimen Collection (cultures, biopsies, blood, body fluids, etc.) 0 []  - Specimen(s) / Culture(s) sent or taken to Lab for analysis 0 []  - Patient Transfer (multiple staff / Harrel Lemon Lift / Similar devices) 0 []  - Simple Staple / Suture removal (25 or less) 0 []  - Complex Staple / Suture removal (26 or more) 0 []  - Hypo / Hyperglycemic Management (close monitor of Blood Glucose) 0 []  - Ankle / Brachial Index (ABI) - do not check if billed separately 0 X - Vital Signs 1 5 Has the patient been seen at the hospital within the last three years: Yes Total Score: 70 Level Of Care: New/Established - Level 2 Electronic Signature(s) Signed: 02/03/2015 5:02:22 PM By: Alric Quan Entered By: Alric Quan on 02/03/2015 10:45:16 Ellsworth, [name] ([id number]) -------------------------------------------------------------------------------- Encounter Discharge Information Details Patient Name: [name] 02/03/2015 10:15 Date of Service: AM Medical Record [id number] Number: Patient Account Number: [id number] [birth year] (59 y.o. Treating RN: Ahmed Prima Date of Birth/Sex: Female) Other Clinician: Primary Care Physician: Santiago Bur Referring Physician: [name] Physician/Extender: Weeks in Treatment: 7 Encounter Discharge Information Items Discharge Pain Level: 0 Discharge Condition: Stable Ambulatory Status: Ambulatory Discharge Destination: Home Transportation: Private Auto Accompanied By: self Schedule Follow-up Appointment: Yes Medication Reconciliation completed and provided to Patient/Care Yes Genese Quebedeaux: Provided on Clinical Summary of Care: 02/03/2015 Form Type Recipient Paper Patient CM Electronic Signature(s) Signed: 02/03/2015 10:50:42 AM By: Ruthine Dose Entered By: Ruthine Dose on 02/03/2015 10:50:42 Kluger, Tanijah Loni Muse ([id number]) -------------------------------------------------------------------------------- Lower Extremity Assessment Details Patient Name: [name] 02/03/2015 10:15 Date of Service: AM Medical Record [id number] Number: Patient Account Number: [id number] Oct 04, 1955 (59 y.o. Treating RN: Ahmed Prima Date of Birth/Sex: Female) Other Clinician: Primary Care Physician: Santiago Bur Referring Physician: [name] Physician/Extender: Weeks in Treatment: 7 Vascular Assessment Pulses: Posterior Tibial Dorsalis Pedis Palpable: [Left:Yes] Extremity colors, hair growth, and conditions: Extremity Color: [Left:Normal] Hair Growth on Extremity: [Left:Yes] Temperature of Extremity: [Left:Warm] Capillary Refill: [Left:< 3 seconds] Toe Nail Assessment Left: Right: Thick: No Discolored: No Deformed: No Improper Length and Hygiene: No Electronic Signature(s) Signed: 02/03/2015 5:02:22 PM By: Alric Quan Entered By: Alric Quan on 02/03/2015 10:35:55 Krage, Cova Loni Muse ([id number]) -------------------------------------------------------------------------------- Multi Wound Chart Details Patient Name: [name] 02/03/2015 10:15 Date of Service: AM Medical  Record [id number] Number: Patient Account Number: [id number] 07/09/1955 (59 y.o. Treating RN: Ahmed Prima Date of Birth/Sex: Female) Other Clinician: Primary Care Physician: Santiago Bur Referring Physician: [name] Physician/Extender: Weeks in Treatment: 7 Vital Signs Height(in): 67 Pulse(bpm): 79 Weight(lbs): 190 Blood Pressure 99/68 (mmHg): Body Mass Index(BMI): 30 Temperature(F): 98.0 Respiratory Rate 20 (breaths/min): Photos: [1:No Photos] [N/A:N/A] Wound Location: [1:Left Lower Leg - Medial] [N/A:N/A] Wounding Event: [1:Trauma] [N/A:N/A] Primary Etiology: [1:Trauma, Other] [N/A:N/A] Comorbid History: [1:Seizure Disorder] [N/A:N/A] Date Acquired: [1:11/09/2014] [N/A:N/A] Weeks of Treatment: [1:7] [N/A:N/A] Wound Status: [1:Open] [N/A:N/A] Measurements L x W x D 0.4x0.4x0.2 [N/A:N/A] (cm) Area (cm) : [1:0.126] [N/A:N/A]  Volume (cm) : [1:0.025] [N/A:N/A] % Reduction in Area: [1:95.40%] [N/A:N/A] % Reduction in Volume: 95.50% [N/A:N/A] Classification: [1:Full Thickness Without Exposed Support Structures] [N/A:N/A] Exudate Amount: [1:Small] [N/A:N/A] Exudate Type: [1:Serous] [N/A:N/A] Exudate Color: [1:amber] [N/A:N/A] Wound Margin: [1:Flat and Intact] [N/A:N/A] Granulation Amount: [1:Small (1-33%)] [N/A:N/A] Granulation Quality: [1:Pink] [N/A:N/A] Necrotic Amount: [1:Small (1-33%)] [N/A:N/A] Exposed Structures: [1:Fascia: No Fat: No Tendon: No Muscle: No] [N/A:N/A] Joint: No Bone: No Limited to Skin Breakdown Epithelialization: Small (1-33%) N/A N/A Periwound Skin Texture: Scarring: Yes N/A N/A Edema: No Excoriation: No Induration: No Callus: No Crepitus: No Fluctuance: No Friable: No Rash: No Periwound Skin Dry/Scaly: Yes N/A N/A Moisture: Maceration: No Moist: No Periwound Skin Color: Hemosiderin Staining: Yes N/A N/A Atrophie Blanche: No Cyanosis: No Ecchymosis: No Erythema: No Mottled: No Pallor:  No Rubor: No Temperature: No Abnormality N/A N/A Tenderness on Yes N/A N/A Palpation: Wound Preparation: Ulcer Cleansing: N/A N/A Rinsed/Irrigated with Saline Topical Anesthetic Applied: Other: lidociane 4% Treatment Notes Electronic Signature(s) Signed: 02/03/2015 5:02:22 PM By: Alric Quan Entered By: Alric Quan on 02/03/2015 10:36:09 [name] ([id number]) -------------------------------------------------------------------------------- [place] Details Patient Name: MIECHELLE, DEVEREAUX A. 02/03/2015 10:15 Date of Service: AM Medical Record [id number] Number: Patient Account Number: [id number] Oct 05, 1955 (59 y.o. Treating RN: Ahmed Prima Date of Birth/Sex: Female) Other Clinician: Primary Care Physician: Santiago Bur Referring Physician: [name] Physician/Extender: Weeks in Treatment: 7 Active Inactive Orientation to the Wound Care Program Nursing Diagnoses: Knowledge deficit related to the wound healing center program Goals: Patient/caregiver will verbalize understanding of the [facility] Program Date Initiated: 12/10/2014 Goal Status: Active Interventions: Provide education on orientation to the wound center Notes: Wound/Skin Impairment Nursing Diagnoses: Impaired tissue integrity Goals: Ulcer/skin breakdown will heal within 14 weeks Date Initiated: 12/10/2014 Goal Status: Active Interventions: Assess patient/caregiver ability to obtain necessary supplies Treatment Activities: Skin care regimen initiated : 02/03/2015 Notes: Electronic Signature(s) Signed: 02/03/2015 5:02:22 PM By: Glory Buff, Wenda A. ([id number]) Entered By: Alric Quan on 02/03/2015 10:36:01 [name] ([id number]) -------------------------------------------------------------------------------- Pain Assessment Details Patient Name: [name] 02/03/2015  10:15 Date of Service: AM Medical Record [id number] Number: Patient Account Number: [id number] 1955/10/02 (59 y.o. Treating RN: Ahmed Prima Date of Birth/Sex: Female) Other Clinician: Primary Care Physician: Santiago Bur Referring Physician: [name] Physician/Extender: Weeks in Treatment: 7 Active Problems Location of Pain Severity and Description of Pain Patient Has Paino No Site Locations Pain Management and Medication Current Pain Management: Electronic Signature(s) Signed: 02/03/2015 5:02:22 PM By: Alric Quan Entered By: Alric Quan on 02/03/2015 10:26:38 [name] ([id number]) -------------------------------------------------------------------------------- Patient/Caregiver Education Details Patient Name: [name] 02/03/2015 10:15 Date of Service: AM Medical Record [id number] Number: Patient Account Number: [id number] [birth year] (59 y.o. Treating RN: Ahmed Prima Date of Birth/Gender: Female) Other Clinician: Primary Care Physician: [name] Treating Christin Fudge Referring Physician: [name] Physician/Extender: [place]ella [place] in Treatment: 7 Education Assessment Education Provided To: Patient Education Topics Provided Wound/Skin Impairment: Handouts: Other: change dressing as ordered Methods: Demonstration, Explain/Verbal Responses: State content correctly Electronic Signature(s) Signed: 02/03/2015 5:02:22 PM By: Alric Quan Entered By: Alric Quan on 02/03/2015 10:46:49 Nocera, Keyanna Loni Muse ([id number]) -------------------------------------------------------------------------------- Wound Assessment Details Patient Name: [name] 02/03/2015 10:15 Date of Service: AM Medical Record [id number] Number: Patient Account Number: [id number] 07-31-1955 (59 y.o. Treating RN: Ahmed Prima Date of Birth/Sex: Female) Other Clinician: Primary Care  Physician: Santiago Bur Referring Physician: [name] Physician/Extender: Weeks in Treatment: 7 Wound Status Wound  Number: 1 Primary Etiology: Trauma, Other Wound Location: Left Lower Leg - Medial Wound Status: Open Wounding Event: Trauma Comorbid History: Seizure Disorder Date Acquired: 11/09/2014 Weeks Of Treatment: 7 Clustered Wound: No Photos Photo Uploaded By: Gretta Cool, RN, BSN, Kim on 02/03/2015 17:12:01 Wound Measurements Length: (cm) 0.4 % Reduction in Ar Width: (cm) 0.4 % Reduction in Vo Depth: (cm) 0.2 Epithelialization Area: (cm) 0.126 Tunneling: Volume: (cm) 0.025 Undermining: ea: 95.4% lume: 95.5% : Small (1-33%) No No Wound Description Full Thickness Without Exposed Foul Odor After Classification: Support Structures Wound Margin: Flat and Intact Exudate Small Amount: Exudate Type: Serous Exudate Color: amber Cleansing: No Wound Bed Granulation Amount: Small (1-33%) Exposed Structure Wadding, Destaney A. ([id number]) Granulation Quality: Pink Fascia Exposed: No Necrotic Amount: Small (1-33%) Fat Layer Exposed: No Necrotic Quality: Adherent Slough Tendon Exposed: No Muscle Exposed: No Joint Exposed: No Bone Exposed: No Limited to Skin Breakdown Periwound Skin Texture Texture Color No Abnormalities Noted: No No Abnormalities Noted: No Callus: No Atrophie Blanche: No Crepitus: No Cyanosis: No Excoriation: No Ecchymosis: No Fluctuance: No Erythema: No Friable: No Hemosiderin Staining: Yes Induration: No Mottled: No Localized Edema: No Pallor: No Rash: No Rubor: No Scarring: Yes Temperature / Pain Moisture Temperature: No Abnormality No Abnormalities Noted: No Tenderness on Palpation: Yes Dry / Scaly: Yes Maceration: No Moist: No Wound Preparation Ulcer Cleansing: Rinsed/Irrigated with Saline Topical Anesthetic Applied: Other: lidociane 4%, Treatment Notes Wound #1 (Left, Medial Lower  Leg) 1. Cleansed with: Clean wound with Normal Saline 2. Anesthetic Topical Lidocaine 4% cream to wound bed prior to debridement 4. Dressing Applied: Prisma Ag 5. Secondary Dressing Applied Bordered Foam Dressing Electronic Signature(s) Signed: 02/03/2015 5:02:22 PM By: Alric Quan Entered By: Alric Quan on 02/03/2015 10:35:08 [name] ([id number]) -------------------------------------------------------------------------------- [place] Details Patient Name: [name] 02/03/2015 10:15 Date of Service: AM Medical Record [id number] Number: Patient Account Number: [id number] 12-15-1955 (59 y.o. Treating RN: Ahmed Prima Date of Birth/Sex: Female) Other Clinician: Primary Care Physician: Brain Hilts, Errol Referring Physician: [name] Physician/Extender: Weeks in Treatment: 7 Vital Signs Time Taken: 10:26 Temperature (F): 98.0 Height (in): 67 Pulse (bpm): 79 Weight (lbs): 190 Respiratory Rate (breaths/min): 20 Body Mass Index (BMI): 29.8 Blood Pressure (mmHg): 99/68 Reference Range: 80 - 120 mg / dl Electronic Signature(s) Signed: 02/03/2015 5:02:22 PM By: Alric Quan Entered By: Alric Quan on 02/03/2015 10:29:05

## 2015-02-10 ENCOUNTER — Ambulatory Visit: Payer: BLUE CROSS/BLUE SHIELD | Admitting: Surgery

## 2015-02-11 ENCOUNTER — Encounter: Payer: BLUE CROSS/BLUE SHIELD | Attending: Surgery | Admitting: Surgery

## 2015-02-11 DIAGNOSIS — F329 Major depressive disorder, single episode, unspecified: Secondary | ICD-10-CM | POA: Diagnosis not present

## 2015-02-11 DIAGNOSIS — G40909 Epilepsy, unspecified, not intractable, without status epilepticus: Secondary | ICD-10-CM | POA: Insufficient documentation

## 2015-02-11 DIAGNOSIS — X58XXXA Exposure to other specified factors, initial encounter: Secondary | ICD-10-CM | POA: Diagnosis not present

## 2015-02-11 DIAGNOSIS — S81812A Laceration without foreign body, left lower leg, initial encounter: Secondary | ICD-10-CM | POA: Diagnosis not present

## 2015-02-11 DIAGNOSIS — F17219 Nicotine dependence, cigarettes, with unspecified nicotine-induced disorders: Secondary | ICD-10-CM | POA: Diagnosis not present

## 2015-02-11 DIAGNOSIS — L97222 Non-pressure chronic ulcer of left calf with fat layer exposed: Secondary | ICD-10-CM | POA: Insufficient documentation

## 2015-02-11 NOTE — Progress Notes (Signed)
AVIGAIL, GRANADOS ([id number]) Visit Report for 02/11/2015 Arrival Information Details Patient Name: [name], [name] Date of Service: 02/11/2015 8:45 AM Medical Record Number: [id number] Patient Account Number: [id number] Date of Birth/Sex: September 20, 1955 (59 y.o. Female) Treating RN: Afful, RN, BSN, Velva Harman Primary Care Physician: [name] Other Clinician: Referring Physician: [name] Treating Physician/Extender: Frann Rider in Treatment: 9 Visit Information History Since Last Visit Any new allergies or adverse reactions: No Patient Arrived: Ambulatory Had a fall or experienced change in No Arrival Time: 09:00 activities of daily living that may affect Accompanied By: self risk of falls: Transfer Assistance: None Signs or symptoms of abuse/neglect since last No Patient Identification Verified: Yes visito Secondary Verification Process Yes Hospitalized since last visit: No Completed: Has Dressing in Place as Prescribed: Yes Patient Requires Transmission-Based No Pain Present Now: No Precautions: Patient Has Alerts: Yes Electronic Signature(s) Signed: 02/11/2015 9:00:36 AM By: Regan Lemming BSN, RN Entered By: Regan Lemming on 02/11/2015 09:00:36 [name] ([id number]) -------------------------------------------------------------------------------- Clinic Level of Care Assessment Details Patient Name: [facility], Yeny A. Date of Service: 02/11/2015 8:45 AM Medical Record Number: [id number] Patient Account Number: [id number] Date of Birth/Sex: [birth year] (59 y.o. Female) Treating RN: Afful, RN, BSN, [place] Primary Care Physician: [name] Other Clinician: Referring Physician: [name] Treating Physician/Extender: Frann Rider in Treatment: 9 Clinic Level of Care Assessment Items TOOL 4 Quantity Score []  - Use when only an EandM is performed on FOLLOW-UP visit 0 ASSESSMENTS - Nursing Assessment / Reassessment X -  Reassessment of Co-morbidities (includes updates in patient status) 1 10 X - Reassessment of Adherence to Treatment Plan 1 5 ASSESSMENTS - Wound and Skin Assessment / Reassessment X - Simple Wound Assessment / Reassessment - one wound 1 5 []  - Complex Wound Assessment / Reassessment - multiple wounds 0 []  - Dermatologic / Skin Assessment (not related to wound area) 0 ASSESSMENTS - Focused Assessment []  - Circumferential Edema Measurements - multi extremities 0 []  - Nutritional Assessment / Counseling / Intervention 0 X - Lower Extremity Assessment (monofilament, tuning fork, pulses) 1 5 []  - Peripheral Arterial Disease Assessment (using hand held doppler) 0 ASSESSMENTS - Ostomy and/or Continence Assessment and Care []  - Incontinence Assessment and Management 0 []  - Ostomy Care Assessment and Management (repouching, etc.) 0 PROCESS - Coordination of Care X - Simple Patient / Family Education for ongoing care 1 15 []  - Complex (extensive) Patient / Family Education for ongoing care 0 []  - Staff obtains Programmer, systems, Records, Test Results / Process Orders 0 []  - Staff telephones HHA, Nursing Homes / Clarify orders / etc 0 []  - Routine Transfer to another Facility (non-emergent condition) 0 [name], [name] ([id number]) []  - Routine Hospital Admission (non-emergent condition) 0 []  - New Admissions / Biomedical engineer / Ordering NPWT, Apligraf, etc. 0 []  - Emergency Hospital Admission (emergent condition) 0 []  - Simple Discharge Coordination 0 []  - Complex (extensive) Discharge Coordination 0 PROCESS - Special Needs []  - Pediatric / Minor Patient Management 0 []  - Isolation Patient Management 0 []  - Hearing / Language / Visual special needs 0 []  - Assessment of Community assistance (transportation, D/C planning, etc.) 0 []  - Additional assistance / Altered mentation 0 []  - Support Surface(s) Assessment (bed, cushion, seat, etc.) 0 INTERVENTIONS - Wound Cleansing / Measurement []  -  Simple Wound Cleansing - one wound 0 []  - Complex Wound Cleansing - multiple wounds 0 X - Wound Imaging (photographs - any number of wounds) 1 5 []  - Wound Tracing (  instead of photographs) 0 []  - Simple Wound Measurement - one wound 0 []  - Complex Wound Measurement - multiple wounds 0 INTERVENTIONS - Wound Dressings []  - Small Wound Dressing one or multiple wounds 0 []  - Medium Wound Dressing one or multiple wounds 0 []  - Large Wound Dressing one or multiple wounds 0 []  - Application of Medications - topical 0 []  - Application of Medications - injection 0 INTERVENTIONS - Miscellaneous []  - External ear exam 0 Corum, Rozelia A. ([id number]) []  - Specimen Collection (cultures, biopsies, blood, body fluids, etc.) 0 []  - Specimen(s) / Culture(s) sent or taken to Lab for analysis 0 []  - Patient Transfer (multiple staff / Harrel Lemon Lift / Similar devices) 0 []  - Simple Staple / Suture removal (25 or less) 0 []  - Complex Staple / Suture removal (26 or more) 0 []  - Hypo / Hyperglycemic Management (close monitor of Blood Glucose) 0 []  - Ankle / Brachial Index (ABI) - do not check if billed separately 0 X - Vital Signs 1 5 Has [facility] patient been seen at [facility] hospital within [facility] last three years: Yes Total Score: 50 Level Of Care: New/Established - Level 2 Electronic Signature(s) Signed: 02/11/2015 9:12:12 AM By: Regan Lemming BSN, RN Entered By: Regan Lemming on 02/11/2015 [name] ([id number]) -------------------------------------------------------------------------------- Encounter Discharge Information Details Patient Name: [name], [name] Date of Service: 02/11/2015 8:45 AM Medical Record Number: [id number] Patient Account Number: [id number] Date of Birth/Sex: 1955/10/13 (59 y.o. Female) Treating RN: Baruch Gouty, RN, BSN, Velva Harman Primary Care Physician: [name] Other Clinician: Referring Physician: [name] Treating Physician/Extender: Frann Rider in  Treatment: 9 Encounter Discharge Information Items Discharge Pain Level: 0 Discharge Condition: Stable Ambulatory Status: Ambulatory Discharge Destination: Home Transportation: Private Auto Accompanied By: self Schedule Follow-up Appointment: No Medication Reconciliation completed and provided to Patient/Care No Mardel Grudzien: Provided on Clinical Summary of Care: 02/11/2015 Form Type Recipient Paper Patient CM Electronic Signature(s) Signed: 02/11/2015 9:15:18 AM By: Ruthine Dose Previous Signature: 02/11/2015 9:13:28 AM Version By: Regan Lemming BSN, RN Entered By: Ruthine Dose on 02/11/2015 09:15:18 Jezewski, Shekina AMarland Kitchen ([id number]) -------------------------------------------------------------------------------- Lower Extremity Assessment Details Patient Name: [name], Jalysa A. Date of Service: 02/11/2015 8:45 AM Medical Record Number: [id number] Patient Account Number: [id number] Date of Birth/Sex: 1955-10-04 (59 y.o. Female) Treating RN: Baruch Gouty, RN, BSN, Velva Harman Primary Care Physician: [name] Other Clinician: Referring Physician: [name] Treating Physician/Extender: Frann Rider in Treatment: 9 Vascular Assessment Pulses: Posterior Tibial Dorsalis Pedis Palpable: [Left:Yes] [Right:Yes] Extremity colors, hair growth, and conditions: Extremity Color: [Left:Normal] Hair Growth on Extremity: [Left:Yes] Temperature of Extremity: [Left:Warm] Capillary Refill: [Left:< 3 seconds] Toe Nail Assessment Left: Right: Thick: No Discolored: No Deformed: No Improper Length and Hygiene: No Electronic Signature(s) Signed: 02/11/2015 9:04:46 AM By: Regan Lemming BSN, RN Entered By: Regan Lemming on 02/11/2015 09:04:46 Kam, Nolita AMarland Kitchen ([id number]) -------------------------------------------------------------------------------- Multi Wound Chart Details Patient Name: [name], Nijae A. Date of Service: 02/11/2015 8:45 AM Medical Record Number:  [id number] Patient Account Number: [id number] Date of Birth/Sex: 01-26-1956 (59 y.o. Female) Treating RN: Baruch Gouty, RN, BSN, Velva Harman Primary Care Physician: [name] Other Clinician: Referring Physician: [name] Treating Physician/Extender: Frann Rider in Treatment: 9 Vital Signs Height(in): 67 Pulse(bpm): 80 Weight(lbs): 190 Blood Pressure 114/73 (mmHg): Body Mass Index(BMI): 30 Temperature(F): 97.8 Respiratory Rate 18 (breaths/min): Photos: [1:No Photos] [N/A:N/A] Wound Location: [1:Left Lower Leg - Medial] [N/A:N/A] Wounding Event: [1:Trauma] [N/A:N/A] Primary Etiology: [1:Trauma, Other] [N/A:N/A] Comorbid History: [1:Seizure Disorder] [N/A:N/A] Date Acquired: [1:11/09/2014] [N/A:N/A] Weeks of Treatment: [1:9] [  N/A:N/A] Wound Status: [1:Healed - Epithelialized] [N/A:N/A] Measurements L x W x D 0x0x0 [N/A:N/A] (cm) Area (cm) : [1:0] [N/A:N/A] Volume (cm) : [1:0] [N/A:N/A] % Reduction in Area: [1:100.00%] [N/A:N/A] % Reduction in Volume: 100.00% [N/A:N/A] Classification: [1:Full Thickness Without Exposed Support Structures] [N/A:N/A] Exudate Amount: [1:None Present] [N/A:N/A] Wound Margin: [1:Flat and Intact] [N/A:N/A] Granulation Amount: [1:Small (1-33%)] [N/A:N/A] Granulation Quality: [1:Pink] [N/A:N/A] Necrotic Amount: [1:None Present (0%)] [N/A:N/A] Exposed Structures: [1:Fascia: No Fat: No Tendon: No Muscle: No Joint: No Bone: No Limited to Skin Breakdown] [N/A:N/A] Epithelialization: Large (67-100%) N/A N/A Periwound Skin Texture: Scarring: Yes N/A N/A Edema: No Excoriation: No Induration: No Callus: No Crepitus: No Fluctuance: No Friable: No Rash: No Periwound Skin Dry/Scaly: Yes N/A N/A Moisture: Maceration: No Moist: No Periwound Skin Color: Hemosiderin Staining: Yes N/A N/A Atrophie Blanche: No Cyanosis: No Ecchymosis: No Erythema: No Mottled: No Pallor: No Rubor: No Temperature: No Abnormality N/A N/A Tenderness on No  N/A N/A Palpation: Wound Preparation: Ulcer Cleansing: N/A N/A Rinsed/Irrigated with Saline Topical Anesthetic Applied: None Treatment Notes Electronic Signature(s) Signed: 02/11/2015 9:09:34 AM By: Regan Lemming BSN, RN Entered By: Regan Lemming on 02/11/2015 [place], [place] Loni Muse ([id number]) -------------------------------------------------------------------------------- [place] Details Patient Name: [name], Kinzee A. Date of Service: 02/11/2015 8:45 AM Medical Record Number: [id number] Patient Account Number: [id number] Date of Birth/Sex: May 05, 1955 (59 y.o. Female) Treating RN: Afful, RN, BSN, Velva Harman Primary Care Physician: [name] Other Clinician: Referring Physician: [name] Treating Physician/Extender: Frann Rider in Treatment: 9 Active Inactive Electronic Signature(s) Signed: 02/11/2015 9:09:17 AM By: Regan Lemming BSN, RN Entered By: Regan Lemming on 02/11/2015 [place], [place] ([id number]) -------------------------------------------------------------------------------- Pain Assessment Details Patient Name: [name], Ikesha A. Date of Service: 02/11/2015 8:45 AM Medical Record Number: [id number] Patient Account Number: [id number] Date of Birth/Sex: [birth year] (59 y.o. Female) Treating RN: Baruch Gouty, RN, BSN, Velva Harman Primary Care Physician: [name] Other Clinician: Referring Physician: [name] Treating Physician/Extender: Frann Rider in Treatment: 9 Active Problems Location of Pain Severity and Description of Pain Patient Has Paino No Site Locations Pain Management and Medication Current Pain Management: Electronic Signature(s) Signed: 02/11/2015 9:00:43 AM By: Regan Lemming BSN, RN Entered By: Regan Lemming on 02/11/2015 09:00:43 [place], Matthias [name] ([id number]) -------------------------------------------------------------------------------- Patient/Caregiver Education Details Patient  Name: [name], Trissa A. Date of Service: 02/11/2015 8:45 AM Medical Record Number: [id number] Patient Account Number: [id number] Date of Birth/Gender: [birth year] (59 y.o. Female) Treating RN: Afful, RN, BSN, Velva Harman Primary Care Physician: [name] Other Clinician: Referring Physician: [name] Treating Physician/Extender: Frann Rider in Treatment: 9 Education Assessment Education Provided To: Patient Education Topics Provided Basic Hygiene: Methods: Explain/Verbal Responses: State content correctly Electronic Signature(s) Signed: 02/11/2015 9:13:51 AM By: Regan Lemming BSN, RN Entered By: Regan Lemming on 02/11/2015 09:13:51 Maltz, Jonice AMarland Kitchen ([id number]) -------------------------------------------------------------------------------- Wound Assessment Details Patient Name: [name], Shaaron A. Date of Service: 02/11/2015 8:45 AM Medical Record Number: [id number] Patient Account Number: [id number] Date of Birth/Sex: [birth year] (59 y.o. Female) Treating RN: Afful, RN, BSN, [place] Primary Care Physician: [name] Other Clinician: Referring Physician: [name] Treating Physician/Extender: Frann Rider in Treatment: 9 Wound Status Wound Number: 1 Primary Etiology: Trauma, Other Wound Location: Left Lower Leg - Medial Wound Status: Healed - Epithelialized Wounding Event: Trauma Comorbid History: Seizure Disorder Date Acquired: 11/09/2014 Weeks Of Treatment: 9 Clustered Wound: No Wound Measurements Length: (cm) 0 % Reduction in Width: (cm) 0 % Reduction in Depth: (cm) 0 Epithelializat Area: (cm) 0 Tunneling: Volume: (cm) 0 Undermining: Area: 100% Volume: 100%  ion: Large (67-100%) No No Wound Description Full Thickness Without Exposed Classification: Support Structures Wound Margin: Flat and Intact Exudate None Present Amount: Foul Odor After Cleansing: No Wound Bed Granulation Amount: Small (1-33%) Exposed  Structure Granulation Quality: Pink Fascia Exposed: No Necrotic Amount: None Present (0%) Fat Layer Exposed: No Tendon Exposed: No Muscle Exposed: No Joint Exposed: No Bone Exposed: No Limited to Skin Breakdown Periwound Skin Texture Texture Color No Abnormalities Noted: No No Abnormalities Noted: No Callus: No Atrophie Blanche: No Crepitus: No Cyanosis: No Excoriation: No Ecchymosis: No Fluctuance: No Erythema: No Rihn, Brigett A. ([id number]) Friable: No Hemosiderin Staining: Yes Induration: No Mottled: No Localized Edema: No Pallor: No Rash: No Rubor: No Scarring: Yes Temperature / Pain Moisture Temperature: No Abnormality No Abnormalities Noted: No Dry / Scaly: Yes Maceration: No Moist: No Wound Preparation Ulcer Cleansing: Rinsed/Irrigated with Saline Topical Anesthetic Applied: None Electronic Signature(s) Signed: 02/11/2015 9:05:17 AM By: Regan Lemming BSN, RN Entered By: Regan Lemming on 02/11/2015 09:05:17 Devincenzi, Timmya Loni Muse ([id number]) -------------------------------------------------------------------------------- Vitals Details Patient Name: [name], Brenee A. Date of Service: 02/11/2015 8:45 AM Medical Record Number: [id number] Patient Account Number: [id number] Date of Birth/Sex: 09/04/1955 (59 y.o. Female) Treating RN: Afful, RN, BSN, [place] Primary Care Physician: [name] Other Clinician: Referring Physician: [name] Treating Physician/Extender: Frann Rider in Treatment: 9 Vital Signs Time Taken: 09:00 Temperature (F): 97.8 Height (in): 67 Pulse (bpm): 80 Weight (lbs): 190 Respiratory Rate (breaths/min): 18 Body Mass Index (BMI): 29.8 Blood Pressure (mmHg): 114/73 Reference Range: 80 - 120 mg / dl Electronic Signature(s) Signed: 02/11/2015 9:03:10 AM By: Regan Lemming BSN, RN Entered By: Regan Lemming on 02/11/2015 09:03:10

## 2015-02-11 NOTE — Progress Notes (Addendum)
[name], [name] ([id number]) Visit Report for 02/11/2015 Chief Complaint Document Details Patient Name: [name], [name] Date of Service: 02/11/2015 8:45 AM Medical Record Patient Account Number: [id number] Number: Afful, RN, BSN, Treating RN: [birth year] [phone] y.o. [name] Date of Birth/Sex: Female) Other Clinician: Primary Care Physician: Santiago Bur Referring Physician: [name] Physician/Extender: Weeks in Treatment: 9 Information Obtained from: Patient Chief Complaint Patient presents to the wound care center for a consult due non healing wound. she has had a wound on her left lower extremity for about a month Electronic Signature(s) Signed: 02/11/2015 9:09:23 AM By: Christin Fudge MD, FACS Entered By: Christin Fudge on 02/11/2015 09:09:23 Stowers, Jazzmon AMarland Kitchen ([id number]) -------------------------------------------------------------------------------- HPI Details Patient Name: [name], Neenah A. Date of Service: 02/11/2015 8:45 AM Medical Record Patient Account Number: [id number] Number: Afful, RN, BSN, Treating RN: 09/05/1955 [phone] y.o. [name] Date of Birth/Sex: Female) Other Clinician: Primary Care Physician: Santiago Bur Referring Physician: [name] Physician/Extender: Weeks in Treatment: 9 History of Present Illness Location: left lower extremity Quality: Patient reports experiencing a dull pain to affected area(s). Severity: Patient states wound are getting worse. Duration: Patient has had the wound for < 4 weeks prior to presenting for treatment Timing: Pain in wound is Intermittent (comes and goes Context: The wound occurred when the patient had a fall and lacerated her left lower extremity about a month ago. She was sutured in the ER and sutures were removed in 15 days. Modifying Factors: Other treatment(s) tried include: he has been on doxycycline and clindamycin and  has been applying local anti-biotic ointment over the wound Associated Signs and Symptoms: Patient reports having difficulty standing for long periods. HPI Description: this 59 year old patient had a lacerated wound to the left lower extremity about a month ago. This was sutured in the ER and the sutures were removed in about 2 weeks' time. The wound then got infected and gaped. She was seen in the urgent care a couple of days ago with an infection and was put on doxycycline and clindamycin. She has been a smoker for several years and smokes about 10 cigarettes a day. Does not have diabetes mellitus. 12/17/2014 -- it has been a few days since she is given of smoking completely and I have commended her on this. 01/07/2015 -- has relapsed a bit with her smoking but has had several social issues and I have encouraged her to continue working hard on her smoking cessation. She is going to be away on a cruise this coming week. Electronic Signature(s) Signed: 02/11/2015 9:09:28 AM By: Christin Fudge MD, FACS Entered By: Christin Fudge on 02/11/2015 [id number] [name] ([id number]) -------------------------------------------------------------------------------- Physical Exam Details Patient Name: [name], [name] Date of Service: 02/11/2015 8:45 AM Medical Record Patient Account Number: [id number] Number: Afful, RN, BSN, Treating RN: 10-24-1955 [phone] y.o. [name] Date of Birth/Sex: Female) Other Clinician: Primary Care Physician: Santiago Bur Referring Physician: [name] Physician/Extender: Weeks in Treatment: 9 Constitutional . Pulse regular. Respirations normal and unlabored. Afebrile. . Eyes Nonicteric. Reactive to light. Ears, Nose, Mouth, and Throat Lips, teeth, and gums WNL.Marland Kitchen Moist mucosa without lesions . Neck supple and nontender. No palpable supraclavicular or cervical adenopathy. Normal sized without  goiter. Respiratory WNL. No retractions.. Cardiovascular Pedal Pulses WNL. No clubbing, cyanosis or edema. Lymphatic No adneopathy. No adenopathy. No adenopathy. Musculoskeletal Adexa without tenderness or enlargement.. Digits and nails w/o clubbing, cyanosis, infection, petechiae, ischemia, or inflammatory conditions.. Integumentary (Hair, Skin) No  suspicious lesions. No crepitus or fluctuance. No peri-wound warmth or erythema. No masses.Marland Kitchen Psychiatric Judgement and insight Intact.. No evidence of depression, anxiety, or agitation.. Notes the wound is completely healed and the scar is supple Electronic Signature(s) Signed: 02/11/2015 9:09:49 AM By: Christin Fudge MD, FACS Entered By: Christin Fudge on 02/11/2015 09:09:49 Godsey, Samya Loni Muse ([id number]) -------------------------------------------------------------------------------- Physician Orders Details Patient Name: [name], [name] Date of Service: 02/11/2015 8:45 AM Medical Record Patient Account Number: [id number] Number: Afful, RN, BSN, Treating RN: Nov 13, 1955 [phone] y.o. [name] Date of Birth/Sex: Female) Other Clinician: Primary Care Physician: Santiago Bur Referring Physician: [name] Physician/Extender: Suella [place] in Treatment: 9 Verbal / Phone Orders: Yes Clinician: Afful, RN, BSN, Rita Read Back and Verified: Yes Diagnosis Coding ICD-10 Coding Code Description [phone] Laceration without foreign body, left lower leg, initial encounter L97.222 Non-pressure chronic ulcer of left calf with fat layer exposed F17.219 Nicotine dependence, cigarettes, with unspecified nicotine-induced disorders Discharge From [facility] o Discharge from [place] Completed Electronic Signature(s) Signed: 02/11/2015 9:10:31 AM By: Regan Lemming BSN, RN Signed: 02/11/2015 4:34:28 PM By: Christin Fudge MD, FACS Entered By: Regan Lemming on 02/11/2015 09:10:31 Todd, Marialice  AMarland Kitchen ([id number]) -------------------------------------------------------------------------------- Problem List Details Patient Name: [name], [name] Date of Service: 02/11/2015 8:45 AM Medical Record Patient Account Number: [id number] Number: Afful, RN, BSN, Treating RN: 1955/04/22 [phone] y.o. [name] Date of Birth/Sex: Female) Other Clinician: Primary Care Physician: Santiago Bur Referring Physician: [name] Physician/Extender: Weeks in Treatment: 9 Active Problems ICD-10 Encounter Code Description Active Date Diagnosis S81.812A Laceration without foreign body, left lower leg, initial 12/10/2014 Yes encounter L97.222 Non-pressure chronic ulcer of left calf with fat layer 12/10/2014 Yes exposed F17.219 Nicotine dependence, cigarettes, with unspecified 12/10/2014 Yes nicotine-induced disorders Inactive Problems Resolved Problems Electronic Signature(s) Signed: 02/11/2015 9:08:54 AM By: Christin Fudge MD, FACS Entered By: Christin Fudge on 02/11/2015 09:08:54 Rojero, Penda AMarland Kitchen ([id number]) -------------------------------------------------------------------------------- Progress Note Details Patient Name: [name], Ortha A. Date of Service: 02/11/2015 8:45 AM Medical Record Patient Account Number: [id number] Number: Afful, RN, BSN, Treating RN: 07-Oct-1955 [phone] y.o. [name] Date of Birth/Sex: Female) Other Clinician: Primary Care Physician: Santiago Bur Referring Physician: [name] Physician/Extender: Weeks in Treatment: 9 Subjective Chief Complaint Information obtained from Patient Patient presents to the wound care center for a consult due non healing wound. she has had a wound on her left lower extremity for about a month History of Present Illness (HPI) The following HPI elements were documented for the patient's wound: Location: left lower extremity Quality: Patient reports  experiencing a dull pain to affected area(s). Severity: Patient states wound are getting worse. Duration: Patient has had the wound for < 4 weeks prior to presenting for treatment Timing: Pain in wound is Intermittent (comes and goes Context: The wound occurred when the patient had a fall and lacerated her left lower extremity about a month ago. She was sutured in the ER and sutures were removed in 15 days. Modifying Factors: Other treatment(s) tried include: he has been on doxycycline and clindamycin and has been applying local anti-biotic ointment over the wound Associated Signs and Symptoms: Patient reports having difficulty standing for long periods. this 59 year old patient had a lacerated wound to the left lower extremity about a month ago. This was sutured in the ER and the sutures were removed in about 2 weeks' time. The wound then got infected and gaped. She was seen in the urgent care a couple  of days ago with an infection and was put on doxycycline and clindamycin. She has been a smoker for several years and smokes about 10 cigarettes a day. Does not have diabetes mellitus. 12/17/2014 -- it has been a few days since she is given of smoking completely and I have commended her on this. 01/07/2015 -- has relapsed a bit with her smoking but has had several social issues and I have encouraged her to continue working hard on her smoking cessation. She is going to be away on a cruise this coming week. Morriss, Leidi A. ([id number]) Objective Constitutional Pulse regular. Respirations normal and unlabored. Afebrile. Vitals Time Taken: 9:00 AM, Height: 67 in, Weight: 190 lbs, BMI: 29.8, Temperature: 97.8 F, Pulse: 80 bpm, Respiratory Rate: 18 breaths/min, Blood Pressure: 114/73 mmHg. Eyes Nonicteric. Reactive to light. Ears, Nose, Mouth, and Throat Lips, teeth, and gums WNL.Marland Kitchen Moist mucosa without lesions . Neck supple and nontender. No palpable supraclavicular or cervical  adenopathy. Normal sized without goiter. Respiratory WNL. No retractions.. Cardiovascular Pedal Pulses WNL. No clubbing, cyanosis or edema. Lymphatic No adneopathy. No adenopathy. No adenopathy. Musculoskeletal Adexa without tenderness or enlargement.. Digits and nails w/o clubbing, cyanosis, infection, petechiae, ischemia, or inflammatory conditions.Marland Kitchen Psychiatric Judgement and insight Intact.. No evidence of depression, anxiety, or agitation.. General Notes: the wound is completely healed and the scar is supple Integumentary (Hair, Skin) No suspicious lesions. No crepitus or fluctuance. No peri-wound warmth or erythema. No masses.. Wound #1 status is Healed - Epithelialized. Original cause of wound was Trauma. The wound is located on the Left,Medial Lower Leg. The wound measures 0cm length x 0cm width x 0cm depth; 0cm^2 area and 0cm^3 volume. The wound is limited to skin breakdown. There is no tunneling or undermining noted. There is a none present amount of drainage noted. The wound margin is flat and intact. There is small (1-33%) pink granulation within the wound bed. There is no necrotic tissue within the wound bed. The periwound skin appearance exhibited: Scarring, Dry/Scaly, Hemosiderin Staining. The periwound skin appearance did not exhibit: Callus, Crepitus, Excoriation, Fluctuance, Friable, Induration, Localized Edema, Rash, Zeiger, Arturo A. ([id number]) Maceration, Moist, Atrophie Blanche, Cyanosis, Ecchymosis, Mottled, Pallor, Rubor, Erythema. Periwound temperature was noted as No Abnormality. Assessment Active Problems ICD-10 S81.812A - Laceration without foreign body, left lower leg, initial encounter L97.222 - Non-pressure chronic ulcer of left calf with fat layer exposed F17.219 - Nicotine dependence, cigarettes, with unspecified nicotine-induced disorders Having reviewed her wound I have asked her to stop the dressing changes,as her wound is healed and continue  with careful cleaning at shower time. She is discharged from the wound care services and be seen back as needed. Plan Having reviewed her wound I have asked her to stop the dressing changes,as her wound is healed and continue with careful cleaning at shower time. She is discharged from the wound care services and be seen back as needed. Electronic Signature(s) Signed: 02/11/2015 9:10:57 AM By: Christin Fudge MD, FACS Entered By: Christin Fudge on 02/11/2015 09:10:57 Bartz, Acelynn Loni Muse ([id number]) -------------------------------------------------------------------------------- SuperBill Details Patient Name: [name], Audryna A. Date of Service: 02/11/2015 Medical Record Patient Account Number: [id number] Number: Afful, RN, BSN, Treating RN: 02/07/1956 [phone] y.o. [name] Date of Birth/Sex: Female) Other Clinician: Primary Care Physician: Santiago Bur Referring Physician: [name] Physician/Extender: Weeks in Treatment: 9 Diagnosis Coding ICD-10 Codes Code Description [phone] Laceration without foreign body, left lower leg, initial encounter L97.222 Non-pressure chronic ulcer of left calf with fat layer exposed F17.219  Nicotine dependence, cigarettes, with unspecified nicotine-induced disorders Facility Procedures CPT4 Code: [id number] Description: [phone] - WOUND CARE VISIT-LEV 2 EST PT Modifier: Quantity: 1 Physician Procedures CPT4: Description Modifier Quantity Code [id number] - WC PHYS LEVEL 2 - EST PT 1 ICD-10 Description Diagnosis S81.812A Laceration without foreign body, left lower leg, initial encounter L97.222 Non-pressure chronic ulcer of left calf with fat layer  exposed F17.219 Nicotine dependence, cigarettes, with unspecified nicotine-induced disorders Electronic Signature(s) Signed: 02/11/2015 4:29:43 PM By: Regan Lemming BSN, RN Signed: 02/11/2015 4:34:28 PM By: Christin Fudge MD, FACS Previous Signature: 02/11/2015 9:11:10 AM  Version By: Christin Fudge MD, FACS Entered By: Regan Lemming on 02/11/2015 16:29:43

## 2015-07-05 ENCOUNTER — Encounter (HOSPITAL_COMMUNITY): Payer: Self-pay

## 2015-07-05 ENCOUNTER — Emergency Department (HOSPITAL_COMMUNITY)
Admission: EM | Admit: 2015-07-05 | Discharge: 2015-07-05 | Disposition: A | Discharge status: Home / Self Care | Payer: BLUE CROSS/BLUE SHIELD | Attending: Emergency Medicine | Admitting: Emergency Medicine

## 2015-07-05 DIAGNOSIS — F329 Major depressive disorder, single episode, unspecified: Secondary | ICD-10-CM | POA: Diagnosis not present

## 2015-07-05 DIAGNOSIS — Z87891 Personal history of nicotine dependence: Secondary | ICD-10-CM | POA: Diagnosis not present

## 2015-07-05 DIAGNOSIS — Z79899 Other long term (current) drug therapy: Secondary | ICD-10-CM | POA: Diagnosis not present

## 2015-07-05 DIAGNOSIS — K5792 Diverticulitis of intestine, part unspecified, without perforation or abscess without bleeding: Secondary | ICD-10-CM | POA: Insufficient documentation

## 2015-07-05 DIAGNOSIS — E860 Dehydration: Secondary | ICD-10-CM

## 2015-07-05 DIAGNOSIS — R112 Nausea with vomiting, unspecified: Secondary | ICD-10-CM

## 2015-07-05 LAB — URINALYSIS, ROUTINE W REFLEX MICROSCOPIC
Bilirubin Urine: NEGATIVE
Glucose, UA: NEGATIVE mg/dL
Ketones, ur: NEGATIVE mg/dL
Nitrite: NEGATIVE
Protein, ur: NEGATIVE mg/dL
Specific Gravity, Urine: 1.025 (ref 1.005–1.030)
pH: 5.5 (ref 5.0–8.0)

## 2015-07-05 LAB — COMPREHENSIVE METABOLIC PANEL
ALT: 17 U/L (ref 14–54)
AST: 15 U/L (ref 15–41)
Albumin: 4.1 g/dL (ref 3.5–5.0)
Alkaline Phosphatase: 75 U/L (ref 38–126)
Anion gap: 10 (ref 5–15)
BUN: 9 mg/dL (ref 6–20)
CO2: 25 mmol/L (ref 22–32)
Calcium: 9.4 mg/dL (ref 8.9–10.3)
Chloride: 104 mmol/L (ref 101–111)
Creatinine, Ser: 0.7 mg/dL (ref 0.44–1.00)
GFR calc Af Amer: >60 mL/min (ref >60)
GFR calc non Af Amer: >60 mL/min (ref >60)
Glucose, Bld: 132 mg/dL — ABNORMAL HIGH (ref 65–99)
Potassium: 3.8 mmol/L (ref 3.5–5.1)
Sodium: 139 mmol/L (ref 135–145)
Total Bilirubin: 0.7 mg/dL (ref 0.3–1.2)
Total Protein: 7 g/dL (ref 6.5–8.1)

## 2015-07-05 LAB — URINE MICROSCOPIC-ADD ON: Squamous Epithelial / LPF: NONE SEEN

## 2015-07-05 LAB — CBC
HCT: 45.2 % (ref 36.0–46.0)
Hemoglobin: 15.2 g/dL — ABNORMAL HIGH (ref 12.0–15.0)
MCH: 34.3 pg — ABNORMAL HIGH (ref 26.0–34.0)
MCHC: 33.6 g/dL (ref 30.0–36.0)
MCV: 102 fL — ABNORMAL HIGH (ref 78.0–100.0)
Platelets: 195 10*3/uL (ref 150–400)
RBC: 4.43 MIL/uL (ref 3.87–5.11)
RDW: 12.4 % (ref 11.5–15.5)
WBC: 8.5 10*3/uL (ref 4.0–10.5)

## 2015-07-05 LAB — LIPASE, BLOOD: Lipase: 14 U/L (ref 11–51)

## 2015-07-05 MED ORDER — ONDANSETRON HCL 4 MG/2ML IJ SOLN
4.0000 mg | Freq: Once | INTRAMUSCULAR | Status: AC | PRN
  Administered 2015-07-05: 4 mg via INTRAVENOUS
  Filled 2015-07-05: qty 2

## 2015-07-05 MED ORDER — FENTANYL CITRATE (PF) 100 MCG/2ML IJ SOLN
50.0000 ug | Freq: Once | INTRAMUSCULAR | Status: AC
  Administered 2015-07-05: 50 ug via INTRAVENOUS
  Filled 2015-07-05: qty 2

## 2015-07-05 MED ORDER — SODIUM CHLORIDE 0.9 % IV BOLUS (SEPSIS)
1000.0000 mL | Freq: Once | INTRAVENOUS | Status: AC
  Administered 2015-07-05: 1000 mL via INTRAVENOUS

## 2015-07-05 MED ORDER — FENTANYL CITRATE (PF) 100 MCG/2ML IJ SOLN
25.0000 ug | Freq: Once | INTRAMUSCULAR | Status: AC
Start: 2015-07-05 — End: 2015-07-05
  Administered 2015-07-05: 25 ug via INTRAVENOUS
  Filled 2015-07-05: qty 2

## 2015-07-05 MED ORDER — ONDANSETRON 4 MG PO TBDP: 4.0000 mg | ORAL_TABLET | Freq: Three times a day (TID) | ORAL | Status: DC | PRN

## 2015-07-05 NOTE — ED Provider Notes (Signed)
CSN: [id number]     Arrival date & time 07/05/15  [id number] History   First MD Initiated Contact with Patient 07/05/15 0454     Chief Complaint  Patient presents with  . Emesis     (Consider location/radiation/quality/duration/timing/severity/associated sxs/prior Treatment) HPI patient has a history of diverticulitis. She reports she started having pain 2 mornings ago very typical for her diverticulitis. She states she has left lower quadrant pain that she states has intermittent sharp cramping pain and then a constant pain that she cannot describe. She states bending over and changing positions makes it hurt more, laying on her side helps it feel better. She started having nausea and vomiting yesterday evening and states she's vomited over 10 times. She denies diarrhea. She has only had urinary output once yesterday morning. She has decreased appetite and states she has a metallic taste in her mouth because she was started on antibiotics by her GYN. She was seen by her GYN on April 27 and he started her on Flagyl and Cipro and oxycodone for pain for diverticulitis. She has a vaginal ring in place because of uterine prolapse and he removed it when he saw her in case that was making her pain worse. States her pain is not worse since she started antibiotics, the vomiting has been uncontrolled.   PCP none GYN Dr Barrie Dunker Therapist [facility] Psychiatrist Dr Phillis Haggis  Past Medical History  Diagnosis Date  . Depression   . Bipolar 1 disorder ([place])   . Diverticulitis    Past Surgical History  Procedure Laterality Date  . Oophrorectomy     No family history on file. Social History  Substance Use Topics  . Smoking status: Former Research scientist (life sciences)  . Smokeless tobacco: None     Comment: none in 9 months. after smoking 1 pack a day x 20 yrs.  . Alcohol Use: Yes   Employed Lives at home Lives alone Smokes 1/2 ppd   OB History    No data available     Review of Systems  All other systems reviewed  and are negative.     Allergies  Review of patient's allergies indicates no known allergies.  Home Medications   Prior to Admission medications   Medication Sig Start Date End Date Taking? Authorizing Provider  ALPRAZolam Duanne Moron) 0.5 MG tablet Take 1 mg by mouth at bedtime as needed for anxiety.    Yes Historical Provider, MD  buPROPion (WELLBUTRIN XL) 150 MG 24 hr tablet Take 150 mg by mouth daily.   Yes Historical Provider, MD  ciprofloxacin (CIPRO) 500 MG tablet Take 1 tablet (500 mg total) by mouth 2 (two) times daily. 12/12/13  Yes Butch Penny, NP  lamoTRIgine (LAMICTAL) 100 MG tablet Take by mouth daily.   Yes Historical Provider, MD  metroNIDAZOLE (FLAGYL) 500 MG tablet Take 1 tablet (500 mg total) by mouth 2 (two) times daily. 12/12/13  Yes Butch Penny, NP  oxyCODONE-acetaminophen (PERCOCET/ROXICET) 5-325 MG tablet Take 1 tablet by mouth every 3 (three) hours as needed for severe pain.   Yes Historical Provider, MD  traZODone (DESYREL) 100 MG tablet Take 100 mg by mouth at bedtime.   Yes Historical Provider, MD  Hydrocodone-Acetaminophen 5-300 MG TABS Take 20 tablets by mouth 4 (four) times daily as needed. 12/12/13   Butch Penny, NP  ondansetron (ZOFRAN ODT) 4 MG disintegrating tablet Take 1 tablet (4 mg total) by mouth every 8 (eight) hours as needed for nausea or vomiting. 07/05/15  Rolland Porter, MD  TraZODone & Diet Manage Prod (TRAZAMINE PO) Take by mouth as needed.    Historical Provider, MD   BP 98/68 mmHg  Pulse 83  Temp(Src) 97.7 F (36.5 C) (Temporal)  Resp 16  SpO2 93%  Vital signs normal except for borderline BP  Physical Exam  Constitutional: She is oriented to person, place, and time. She appears well-developed and well-nourished.  Non-toxic appearance. She does not appear ill. No distress.  HENT:  Head: Normocephalic and atraumatic.  Right Ear: External ear normal.  Left Ear: External ear normal.  Nose: Nose normal. No mucosal edema or rhinorrhea.    Mouth/Throat: Mucous membranes are normal. No dental abscesses or uvula swelling.  Dry mucous membranes  Eyes: Conjunctivae and EOM are normal. Pupils are equal, round, and reactive to light.  Neck: Normal range of motion and full passive range of motion without pain. Neck supple.  Cardiovascular: Normal rate, regular rhythm and normal heart sounds.  Exam reveals no gallop and no friction rub.   No murmur heard. Pulmonary/Chest: Effort normal and breath sounds normal. No respiratory distress. She has no wheezes. She has no rhonchi. She has no rales. She exhibits no tenderness and no crepitus.  Abdominal: Soft. Normal appearance and bowel sounds are normal. She exhibits no distension. There is tenderness. There is no rebound and no guarding.    Patient is very tender in the left lower quadrant although she has some tenderness in the left mid abdomen in the right lower quadrant.  Musculoskeletal: Normal range of motion. She exhibits no edema or tenderness.  Moves all extremities well.   Neurological: She is alert and oriented to person, place, and time. She has normal strength. No cranial nerve deficit.  Skin: Skin is warm, dry and intact. No rash noted. No erythema. No pallor.  Psychiatric: She has a normal mood and affect. Her speech is normal and behavior is normal. Her mood appears not anxious.  Nursing note and vitals reviewed.   ED Course  Procedures (including critical care time)  Medications  ondansetron (ZOFRAN) injection 4 mg (4 mg Intravenous Given 07/05/15 0454)  sodium chloride 0.9 % bolus 1,000 mL (0 mLs Intravenous Stopped 07/05/15 0708)  sodium chloride 0.9 % bolus 1,000 mL (0 mLs Intravenous Stopped 07/05/15 0600)  fentaNYL (SUBLIMAZE) injection 50 mcg (50 mcg Intravenous Given 07/05/15 0514)  sodium chloride 0.9 % bolus 1,000 mL (1,000 mLs Intravenous New Bag/Given 07/05/15 0707)  fentaNYL (SUBLIMAZE) injection 25 mcg (25 mcg Intravenous Given 07/05/15 0708)   Patient was  given IV fluids for dehydration and IV pain and nausea medication.  Patient was rechecked at 6:30 AM she is sleeping, when awakened she states she's feeling much improved. She is smiling. She has not had urinary output yet. She however is willing to try an oral fluid challenge. Discussed her laboratory results.  6:50 AM nurse came to the office and states patient now requesting pain medication although I had just seen her and she said she was doing much better. She had not had her oral fluid challenge yet. She was given fentanyl 25 g IV.  07:20 AM drinking coke, laughing with cousin. Discussed she can go home, but to return if she gets a fever, has uncontrolled vomiting again or her pain gets worse.    Labs Review Results for orders placed or performed during the hospital encounter of 07/05/15  Lipase, blood  Result Value Ref Range   Lipase 14 11 - 51 U/L  Comprehensive  metabolic panel  Result Value Ref Range   Sodium 139 135 - 145 mmol/L   Potassium 3.8 3.5 - 5.1 mmol/L   Chloride 104 101 - 111 mmol/L   CO2 25 22 - 32 mmol/L   Glucose, Bld 132 (H) 65 - 99 mg/dL   BUN 9 6 - 20 mg/dL   Creatinine, Ser 0.70 0.44 - 1.00 mg/dL   Calcium 9.4 8.9 - 10.3 mg/dL   Total Protein 7.0 6.5 - 8.1 g/dL   Albumin 4.1 3.5 - 5.0 g/dL   AST 15 15 - 41 U/L   ALT 17 14 - 54 U/L   Alkaline Phosphatase 75 38 - 126 U/L   Total Bilirubin 0.7 0.3 - 1.2 mg/dL   GFR calc non Af Amer >60 >60 mL/min   GFR calc Af Amer >60 >60 mL/min   Anion gap 10 5 - 15  CBC  Result Value Ref Range   WBC 8.5 4.0 - 10.5 K/uL   RBC 4.43 3.87 - 5.11 MIL/uL   Hemoglobin 15.2 (H) 12.0 - 15.0 g/dL   HCT 45.2 36.0 - 46.0 %   MCV 102.0 (H) 78.0 - 100.0 fL   MCH 34.3 (H) 26.0 - 34.0 pg   MCHC 33.6 30.0 - 36.0 g/dL   RDW 12.4 11.5 - 15.5 %   Platelets 195 150 - 400 K/uL  Urinalysis, Routine w reflex microscopic  Result Value Ref Range   Color, Urine YELLOW YELLOW   APPearance CLEAR CLEAR   Specific Gravity, Urine 1.025  1.005 - 1.030   pH 5.5 5.0 - 8.0   Glucose, UA NEGATIVE NEGATIVE mg/dL   Hgb urine dipstick SMALL (A) NEGATIVE   Bilirubin Urine NEGATIVE NEGATIVE   Ketones, ur NEGATIVE NEGATIVE mg/dL   Protein, ur NEGATIVE NEGATIVE mg/dL   Nitrite NEGATIVE NEGATIVE   Leukocytes, UA TRACE (A) NEGATIVE  Urine microscopic-add on  Result Value Ref Range   Squamous Epithelial / LPF NONE SEEN NONE SEEN   WBC, UA 0-5 0 - 5 WBC/hpf   RBC / HPF 0-5 0 - 5 RBC/hpf   Bacteria, UA FEW (A) NONE SEEN   Laboratory interpretation all normal except concentrated hemoglobin consistent with dehydration    MDM   Final diagnoses:  Diverticulitis of intestine w/o perforation or abscess w/o bleeding  Nausea and vomiting, vomiting of unspecified type  Dehydration   New Prescriptions   ONDANSETRON (ZOFRAN ODT) 4 MG DISINTEGRATING TABLET    Take 1 tablet (4 mg total) by mouth every 8 (eight) hours as needed for nausea or vomiting.    Plan discharge  Rolland Porter, MD, Barbette Or, MD 07/05/15 0730

## 2015-07-05 NOTE — ED Notes (Signed)
Pt made aware to return if symptoms worsen or if any life threatening symptoms occur.   

## 2015-07-05 NOTE — Discharge Instructions (Signed)
Drink plenty of fluids today, a few doing well later this afternoon he can have a bland diet such as toast, crackers, Jell-O, or [employer] chicken new. Any sure antibiotics. Take your oxycodone as needed for pain. Return to the emergency room if you feel worse such as fever, chills, uncontrolled vomiting despite taking the nausea medication, or worsening pain, or rectal bleeding. When she diverticulitis has resolved look at the diverticulosis diet to help prevent further episodes. Diverticulitis Diverticulitis is inflammation or infection of small pouches in your colon that form when you have a condition called diverticulosis. The pouches in your colon are called diverticula. Your colon, or large intestine, is where water is absorbed and stool is formed. Complications of diverticulitis can include:  Bleeding.  Severe infection.  Severe pain.  Perforation of your colon.  Obstruction of your colon. CAUSES  Diverticulitis is caused by bacteria. Diverticulitis happens when stool becomes trapped in diverticula. This allows bacteria to grow in the diverticula, which can lead to inflammation and infection. RISK FACTORS People with diverticulosis are at risk for diverticulitis. Eating a diet that does not include enough fiber from fruits and vegetables may make diverticulitis more likely to develop. SYMPTOMS  Symptoms of diverticulitis may include:  Abdominal pain and tenderness. The pain is normally located on the left side of the abdomen, but may occur in other areas.  Fever and chills.  Bloating.  Cramping.  Nausea.  Vomiting.  Constipation.  Diarrhea.  Blood in your stool. DIAGNOSIS  Your health care provider will ask you about your medical history and do a physical exam. You may need to have tests done because many medical conditions can cause the same symptoms as diverticulitis. Tests may include:  Blood tests.  Urine tests.  Imaging tests of the abdomen, including X-rays  and CT scans. When your condition is under control, your health care provider may recommend that you have a colonoscopy. A colonoscopy can show how severe your diverticula are and whether something else is causing your symptoms. TREATMENT  Most cases of diverticulitis are mild and can be treated at home. Treatment may include:  Taking over-the-counter pain medicines.  Following a clear liquid diet.  Taking antibiotic medicines by mouth for 7-10 days. More severe cases may be treated at a hospital. Treatment may include:  Not eating or drinking.  Taking prescription pain medicine.  Receiving antibiotic medicines through an IV tube.  Receiving fluids and nutrition through an IV tube.  Surgery. HOME CARE INSTRUCTIONS   Follow your health care provider's instructions carefully.  Follow a full liquid diet or other diet as directed by your health care provider. After your symptoms improve, your health care provider may tell you to change your diet. He or she may recommend you eat a high-fiber diet. Fruits and vegetables are good sources of fiber. Fiber makes it easier to pass stool.  Take fiber supplements or probiotics as directed by your health care provider.  Only take medicines as directed by your health care provider.  Keep all your follow-up appointments. SEEK MEDICAL CARE IF:   Your pain does not improve.  You have a hard time eating food.  Your bowel movements do not return to normal. SEEK IMMEDIATE MEDICAL CARE IF:   Your pain becomes worse.  Your symptoms do not get better.  Your symptoms suddenly get worse.  You have a fever.  You have repeated vomiting.  You have bloody or black, tarry stools. MAKE SURE YOU:   Understand these instructions.  Will watch your condition.  Will get help right away if you are not doing well or get worse.   This information is not intended to replace advice given to you by your health care provider. Make sure you discuss  any questions you have with your health care provider.   Document Released: 12/02/2004 Document Revised: 02/27/2013 Document Reviewed: 01/17/2013 Elsevier Interactive Patient Education 2016 [employer]  Nausea and Vomiting Nausea means you feel sick to your stomach. Throwing up (vomiting) is a reflex where stomach contents come out of your mouth. HOME CARE   Take medicine as told by your doctor.  Do not force yourself to eat. However, you do need to drink fluids.  If you feel like eating, eat a normal diet as told by your doctor.  Eat rice, wheat, potatoes, bread, lean meats, yogurt, fruits, and vegetables.  Avoid high-fat foods.  Drink enough fluids to keep your pee (urine) clear or pale yellow.  Ask your doctor how to replace body fluid losses (rehydrate). Signs of body fluid loss (dehydration) include:  Feeling very thirsty.  Dry lips and mouth.  Feeling dizzy.  Dark pee.  Peeing less than normal.  Feeling confused.  Fast breathing or heart rate. GET HELP RIGHT AWAY IF:   You have blood in your throw up.  You have black or bloody poop (stool).  You have a bad headache or stiff neck.  You feel confused.  You have bad belly (abdominal) pain.  You have chest pain or trouble breathing.  You do not pee at least once every 8 hours.  You have cold, clammy skin.  You keep throwing up after 24 to 48 hours.  You have a fever. MAKE SURE YOU:   Understand these instructions.  Will watch your condition.  Will get help right away if you are not doing well or get worse.   This information is not intended to replace advice given to you by your health care provider. Make sure you discuss any questions you have with your health care provider.   Document Released: 08/11/2007 Document Revised: 05/17/2011 Document Reviewed: 07/24/2010 Elsevier Interactive Patient Education [employer].

## 2015-07-05 NOTE — ED Notes (Signed)
Pt states she went to her doctor on Thursday and was diagnosed with diverticulitis, given antibiotics and pain meds but no nausea meds. Pt has been vomiting worse since last evening.

## 2015-07-09 ENCOUNTER — Encounter (INDEPENDENT_AMBULATORY_CARE_PROVIDER_SITE_OTHER): Payer: Self-pay | Admitting: *Deleted

## 2015-07-21 ENCOUNTER — Ambulatory Visit (INDEPENDENT_AMBULATORY_CARE_PROVIDER_SITE_OTHER): Payer: BLUE CROSS/BLUE SHIELD | Admitting: Internal Medicine

## 2015-12-01 ENCOUNTER — Encounter (HOSPITAL_COMMUNITY): Payer: Self-pay | Admitting: Emergency Medicine

## 2015-12-01 ENCOUNTER — Emergency Department (HOSPITAL_COMMUNITY): Payer: BLUE CROSS/BLUE SHIELD

## 2015-12-01 ENCOUNTER — Emergency Department (HOSPITAL_COMMUNITY)
Admission: EM | Admit: 2015-12-01 | Discharge: 2015-12-01 | Disposition: A | Discharge status: Home / Self Care | Payer: BLUE CROSS/BLUE SHIELD | Attending: Emergency Medicine | Admitting: Emergency Medicine

## 2015-12-01 DIAGNOSIS — Z87891 Personal history of nicotine dependence: Secondary | ICD-10-CM | POA: Insufficient documentation

## 2015-12-01 DIAGNOSIS — Z79899 Other long term (current) drug therapy: Secondary | ICD-10-CM | POA: Insufficient documentation

## 2015-12-01 DIAGNOSIS — Y9389 Activity, other specified: Secondary | ICD-10-CM | POA: Diagnosis not present

## 2015-12-01 DIAGNOSIS — M7918 Myalgia, other site: Secondary | ICD-10-CM

## 2015-12-01 DIAGNOSIS — Y999 Unspecified external cause status: Secondary | ICD-10-CM | POA: Diagnosis not present

## 2015-12-01 DIAGNOSIS — R1011 Right upper quadrant pain: Secondary | ICD-10-CM | POA: Diagnosis not present

## 2015-12-01 DIAGNOSIS — J984 Other disorders of lung: Secondary | ICD-10-CM | POA: Diagnosis not present

## 2015-12-01 DIAGNOSIS — R1012 Left upper quadrant pain: Secondary | ICD-10-CM | POA: Diagnosis not present

## 2015-12-01 DIAGNOSIS — Y92410 Unspecified street and highway as the place of occurrence of the external cause: Secondary | ICD-10-CM | POA: Diagnosis not present

## 2015-12-01 DIAGNOSIS — S0990XA Unspecified injury of head, initial encounter: Secondary | ICD-10-CM | POA: Diagnosis present

## 2015-12-01 LAB — COMPREHENSIVE METABOLIC PANEL
ALT: 19 U/L (ref 14–54)
AST: 17 U/L (ref 15–41)
Albumin: 4 g/dL (ref 3.5–5.0)
Alkaline Phosphatase: 83 U/L (ref 38–126)
Anion gap: 6 (ref 5–15)
BUN: 11 mg/dL (ref 6–20)
CO2: 27 mmol/L (ref 22–32)
Calcium: 9.1 mg/dL (ref 8.9–10.3)
Chloride: 105 mmol/L (ref 101–111)
Creatinine, Ser: 0.61 mg/dL (ref 0.44–1.00)
GFR calc Af Amer: >60 mL/min (ref >60)
GFR calc non Af Amer: >60 mL/min (ref >60)
Glucose, Bld: 94 mg/dL (ref 65–99)
Potassium: 4.2 mmol/L (ref 3.5–5.1)
Sodium: 138 mmol/L (ref 135–145)
Total Bilirubin: 0.4 mg/dL (ref 0.3–1.2)
Total Protein: 6.3 g/dL — ABNORMAL LOW (ref 6.5–8.1)

## 2015-12-01 LAB — CBC WITH DIFFERENTIAL/PLATELET
Basophils Absolute: 0 10*3/uL (ref 0.0–0.1)
Basophils Relative: 0 %
Eosinophils Absolute: 0.1 10*3/uL (ref 0.0–0.7)
Eosinophils Relative: 2 %
HCT: 43.5 % (ref 36.0–46.0)
Hemoglobin: 14.4 g/dL (ref 12.0–15.0)
Lymphocytes Relative: 27 %
Lymphs Abs: 1.6 10*3/uL (ref 0.7–4.0)
MCH: 34.3 pg — ABNORMAL HIGH (ref 26.0–34.0)
MCHC: 33.1 g/dL (ref 30.0–36.0)
MCV: 103.6 fL — ABNORMAL HIGH (ref 78.0–100.0)
Monocytes Absolute: 0.5 10*3/uL (ref 0.1–1.0)
Monocytes Relative: 8 %
Neutro Abs: 3.8 10*3/uL (ref 1.7–7.7)
Neutrophils Relative %: 63 %
Platelets: 205 10*3/uL (ref 150–400)
RBC: 4.2 MIL/uL (ref 3.87–5.11)
RDW: 12.2 % (ref 11.5–15.5)
WBC: 6.1 10*3/uL (ref 4.0–10.5)

## 2015-12-01 LAB — LIPASE, BLOOD: Lipase: 18 U/L (ref 11–51)

## 2015-12-01 MED ORDER — CYCLOBENZAPRINE HCL 5 MG PO TABS: 5.0000 mg | ORAL_TABLET | Freq: Three times a day (TID) | ORAL | 0 refills | Status: DC | PRN

## 2015-12-01 MED ORDER — ACETAMINOPHEN 500 MG PO TABS
1000.0000 mg | ORAL_TABLET | Freq: Once | ORAL | Status: AC
  Administered 2015-12-01: 1000 mg via ORAL
  Filled 2015-12-01: qty 2

## 2015-12-01 MED ORDER — IBUPROFEN 800 MG PO TABS
800.0000 mg | ORAL_TABLET | Freq: Once | ORAL | Status: AC
  Administered 2015-12-01: 800 mg via ORAL
  Filled 2015-12-01: qty 1

## 2015-12-01 MED ORDER — IBUPROFEN 600 MG PO TABS: 600.0000 mg | ORAL_TABLET | Freq: Four times a day (QID) | ORAL | 0 refills | Status: DC | PRN
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
## 2015-12-01 NOTE — ED Notes (Signed)
Patient transported to CT 

## 2016-03-13 ENCOUNTER — Emergency Department (HOSPITAL_COMMUNITY): Payer: BLUE CROSS/BLUE SHIELD

## 2016-03-13 ENCOUNTER — Emergency Department (HOSPITAL_COMMUNITY)
Admission: EM | Admit: 2016-03-13 | Discharge: 2016-03-14 | Disposition: A | Discharge status: Home / Self Care | Payer: BLUE CROSS/BLUE SHIELD | Attending: Emergency Medicine | Admitting: Emergency Medicine

## 2016-03-13 ENCOUNTER — Encounter (HOSPITAL_COMMUNITY): Payer: Self-pay | Admitting: *Deleted

## 2016-03-13 DIAGNOSIS — N3001 Acute cystitis with hematuria: Secondary | ICD-10-CM | POA: Diagnosis not present

## 2016-03-13 DIAGNOSIS — R1030 Lower abdominal pain, unspecified: Secondary | ICD-10-CM | POA: Diagnosis present

## 2016-03-13 DIAGNOSIS — Z87891 Personal history of nicotine dependence: Secondary | ICD-10-CM | POA: Diagnosis not present

## 2016-03-13 DIAGNOSIS — Z79899 Other long term (current) drug therapy: Secondary | ICD-10-CM | POA: Diagnosis not present

## 2016-03-13 DIAGNOSIS — K5732 Diverticulitis of large intestine without perforation or abscess without bleeding: Secondary | ICD-10-CM | POA: Insufficient documentation

## 2016-03-13 LAB — CBC WITH DIFFERENTIAL/PLATELET
Basophils Absolute: 0 10*3/uL (ref 0.0–0.1)
Basophils Relative: 0 %
Eosinophils Absolute: 0.1 10*3/uL (ref 0.0–0.7)
Eosinophils Relative: 2 %
HCT: 44.7 % (ref 36.0–46.0)
Hemoglobin: 14.7 g/dL (ref 12.0–15.0)
Lymphocytes Relative: 30 %
Lymphs Abs: 2.2 10*3/uL (ref 0.7–4.0)
MCH: 34.1 pg — ABNORMAL HIGH (ref 26.0–34.0)
MCHC: 32.9 g/dL (ref 30.0–36.0)
MCV: 103.7 fL — ABNORMAL HIGH (ref 78.0–100.0)
Monocytes Absolute: 0.7 10*3/uL (ref 0.1–1.0)
Monocytes Relative: 10 %
Neutro Abs: 4.3 10*3/uL (ref 1.7–7.7)
Neutrophils Relative %: 58 %
Platelets: 208 10*3/uL (ref 150–400)
RBC: 4.31 MIL/uL (ref 3.87–5.11)
RDW: 12.5 % (ref 11.5–15.5)
WBC: 7.4 10*3/uL (ref 4.0–10.5)

## 2016-03-13 MED ORDER — ONDANSETRON HCL 4 MG/2ML IJ SOLN
4.0000 mg | Freq: Once | INTRAMUSCULAR | Status: AC
  Administered 2016-03-13: 4 mg via INTRAVENOUS
  Filled 2016-03-13: qty 2

## 2016-03-13 MED ORDER — IOPAMIDOL (ISOVUE-300) INJECTION 61%
100.0000 mL | Freq: Once | INTRAVENOUS | Status: AC | PRN
  Administered 2016-03-14: 100 mL via INTRAVENOUS

## 2016-03-13 MED ORDER — MORPHINE SULFATE (PF) 4 MG/ML IV SOLN
4.0000 mg | Freq: Once | INTRAVENOUS | Status: AC
Start: 2016-03-13 — End: 2016-03-13
  Administered 2016-03-13: 4 mg via INTRAVENOUS
  Filled 2016-03-13: qty 1

## 2016-03-13 MED ORDER — SODIUM CHLORIDE 0.9 % IV BOLUS (SEPSIS)
1000.0000 mL | Freq: Once | INTRAVENOUS | Status: AC
  Administered 2016-03-13: 1000 mL via INTRAVENOUS

## 2016-03-13 NOTE — ED Triage Notes (Signed)
Pt c/o lower abdominal pain with sharp pains on the left side of her abdomen. Pt states the last time she had diverticulitis, she felt like this. Pt denies ever having kidney stones.

## 2016-03-14 LAB — COMPREHENSIVE METABOLIC PANEL
ALT: 17 U/L (ref 14–54)
AST: 15 U/L (ref 15–41)
Albumin: 4.3 g/dL (ref 3.5–5.0)
Alkaline Phosphatase: 101 U/L (ref 38–126)
Anion gap: 7 (ref 5–15)
BUN: 17 mg/dL (ref 6–20)
CO2: 26 mmol/L (ref 22–32)
Calcium: 9.3 mg/dL (ref 8.9–10.3)
Chloride: 103 mmol/L (ref 101–111)
Creatinine, Ser: 0.78 mg/dL (ref 0.44–1.00)
GFR calc Af Amer: >60 mL/min (ref >60)
GFR calc non Af Amer: >60 mL/min (ref >60)
Glucose, Bld: 105 mg/dL — ABNORMAL HIGH (ref 65–99)
Potassium: 3.7 mmol/L (ref 3.5–5.1)
Sodium: 136 mmol/L (ref 135–145)
Total Bilirubin: 0.3 mg/dL (ref 0.3–1.2)
Total Protein: 6.9 g/dL (ref 6.5–8.1)

## 2016-03-14 LAB — URINALYSIS, ROUTINE W REFLEX MICROSCOPIC
Bilirubin Urine: NEGATIVE
Glucose, UA: NEGATIVE mg/dL
Ketones, ur: NEGATIVE mg/dL
Nitrite: NEGATIVE
Protein, ur: NEGATIVE mg/dL
Specific Gravity, Urine: 1.02 (ref 1.005–1.030)
pH: 5 (ref 5.0–8.0)

## 2016-03-14 LAB — LIPASE, BLOOD: Lipase: 48 U/L (ref 11–51)

## 2016-03-14 MED ORDER — ONDANSETRON 4 MG PO TBDP: 4.0000 mg | ORAL_TABLET | Freq: Three times a day (TID) | ORAL | 0 refills | Status: DC | PRN

## 2016-03-14 MED ORDER — HYDROMORPHONE HCL 1 MG/ML IJ SOLN
1.0000 mg | Freq: Once | INTRAMUSCULAR | Status: AC
  Administered 2016-03-14: 1 mg via INTRAVENOUS
  Filled 2016-03-14: qty 1

## 2016-03-14 MED ORDER — OXYCODONE-ACETAMINOPHEN 5-325 MG PO TABS: 1.0000 | ORAL_TABLET | Freq: Four times a day (QID) | ORAL | 0 refills | Status: DC | PRN

## 2016-03-14 MED ORDER — CIPROFLOXACIN HCL 500 MG PO TABS: 500.0000 mg | ORAL_TABLET | Freq: Two times a day (BID) | ORAL | 0 refills | Status: DC

## 2016-03-14 MED ORDER — IOPAMIDOL (ISOVUE-300) INJECTION 61%
INTRAVENOUS | Status: AC
  Administered 2016-03-14: 15 mL via ORAL
  Filled 2016-03-14: qty 30

## 2016-03-14 MED ORDER — ONDANSETRON HCL 4 MG/2ML IJ SOLN
4.0000 mg | Freq: Once | INTRAMUSCULAR | Status: AC
  Administered 2016-03-14: 4 mg via INTRAVENOUS
  Filled 2016-03-14: qty 2

## 2016-03-14 MED ORDER — METRONIDAZOLE 500 MG PO TABS: 500.0000 mg | ORAL_TABLET | Freq: Three times a day (TID) | ORAL | 0 refills | Status: DC

## 2016-03-14 MED ORDER — CIPROFLOXACIN HCL 250 MG PO TABS
500.0000 mg | ORAL_TABLET | Freq: Once | ORAL | Status: AC
  Administered 2016-03-14: 500 mg via ORAL
  Filled 2016-03-14: qty 2

## 2016-03-14 MED ORDER — METRONIDAZOLE 500 MG PO TABS
500.0000 mg | ORAL_TABLET | Freq: Once | ORAL | Status: AC
  Administered 2016-03-14: 500 mg via ORAL
  Filled 2016-03-14: qty 1

## 2016-03-14 NOTE — ED Notes (Signed)
Pt states that her pain is not any better, Dr Leonides Schanz notified, additional orders given,

## 2016-03-14 NOTE — ED Provider Notes (Signed)
Please see my note from same date.  This is a duplicate.   [place], DO 03/14/16 [phone]

## 2016-03-14 NOTE — ED Notes (Signed)
Patient transported to CT 

## 2016-03-14 NOTE — ED Provider Notes (Signed)
TIME SEEN: 11:20 PM  CHIEF COMPLAINT: Abdominal pain  HPI: Pt is a 61 y.o. female with history of bipolar disorder, diverticulitis who presents to the emergency department with complaints of lower abdominal pain that started today. Patient reports nausea and diarrhea but no vomiting, bloody stools, melena, dysuria, hematuria, vaginal bleeding or discharge. Feels similar to her prior diverticulitis but is more severe today and radiates into her vagina which is abnormal for her. Has had associated chills but no fever. No history of abdominal surgery. No aggravating or relieving factors.  ROS: See HPI Constitutional: no fever  Eyes: no drainage  ENT: no runny nose   Cardiovascular:  no chest pain  Resp: no SOB  GI: no vomiting; + diarrhea GU: no dysuria Integumentary: no rash  Allergy: no hives  Musculoskeletal: no leg swelling  Neurological: no slurred speech ROS otherwise negative  PAST MEDICAL HISTORY/PAST SURGICAL HISTORY:  Past Medical History:  Diagnosis Date  . Bipolar 1 disorder ([place])   . Depression   . Diverticulitis     MEDICATIONS:  Prior to Admission medications   Medication Sig Start Date End Date Taking? Authorizing Provider  ALPRAZolam Duanne Moron) 0.5 MG tablet Take 1 mg by mouth at bedtime as needed for anxiety.    Yes Historical Provider, MD  ibuprofen (ADVIL,MOTRIN) 600 MG tablet Take 1 tablet (600 mg total) by mouth every 6 (six) hours as needed. 12/01/15  Yes Evalee Jefferson, PA-C  traZODone (DESYREL) 100 MG tablet Take 100 mg by mouth at bedtime.   Yes Historical Provider, MD  buPROPion (WELLBUTRIN XL) 150 MG 24 hr tablet Take 450 mg by mouth daily.     Historical Provider, MD  ciprofloxacin (CIPRO) 500 MG tablet Take 1 tablet (500 mg total) by mouth 2 (two) times daily. 12/12/13   Butch Penny, NP  cyclobenzaprine (FLEXERIL) 5 MG tablet Take 1 tablet (5 mg total) by mouth 3 (three) times daily as needed for muscle spasms. 12/01/15   Evalee Jefferson, PA-C   Hydrocodone-Acetaminophen 5-300 MG TABS Take 20 tablets by mouth 4 (four) times daily as needed. 12/12/13   Butch Penny, NP  lamoTRIgine (LAMICTAL) 100 MG tablet Take by mouth 2 (two) times daily.     Historical Provider, MD  metroNIDAZOLE (FLAGYL) 500 MG tablet Take 1 tablet (500 mg total) by mouth 2 (two) times daily. 12/12/13   Butch Penny, NP  ondansetron (ZOFRAN ODT) 4 MG disintegrating tablet Take 1 tablet (4 mg total) by mouth every 8 (eight) hours as needed for nausea or vomiting. 07/05/15   Rolland Porter, MD  oxyCODONE-acetaminophen (PERCOCET/ROXICET) 5-325 MG tablet Take 1 tablet by mouth every 3 (three) hours as needed for severe pain.    Historical Provider, MD  TraZODone & Diet Manage Prod (TRAZAMINE PO) Take by mouth as needed.    Historical Provider, MD    ALLERGIES:  No Known Allergies  SOCIAL HISTORY:  Social History  Substance Use Topics  . Smoking status: Former Research scientist (life sciences)  . Smokeless tobacco: Never Used     Comment: none in 9 months. after smoking 1 pack a day x 20 yrs.  . Alcohol use Yes     Comment: occassionally    FAMILY HISTORY: History reviewed. No pertinent family history.  EXAM: BP 119/74 (BP Location: Left Arm)   Temp 97.9 F (36.6 C) (Oral)   Resp 20   Ht 5\' 7"  (1.702 m)   Wt 179 lb (81.2 kg)   SpO2 96%   BMI 28.04  kg/m  CONSTITUTIONAL: Alert and oriented and responds appropriately to questions. Appears uncomfortable but is afebrile and nontoxic HEAD: Normocephalic EYES: Conjunctivae clear, PERRL, EOMI ENT: normal nose; no rhinorrhea; moist mucous membranes NECK: Supple, no meningismus, no nuchal rigidity, no LAD  CARD: RRR; S1 and S2 appreciated; no murmurs, no clicks, no rubs, no gallops RESP: Normal chest excursion without splinting or tachypnea; breath sounds clear and equal bilaterally; no wheezes, no rhonchi, no rales, no hypoxia or respiratory distress, speaking full sentences ABD/GI: Normal bowel sounds; non-distended; soft, to palpation  suprapubic and left lower quadrant region with intermittent voluntary guarding BACK:  The back appears normal and is non-tender to palpation, there is no CVA tenderness EXT: Normal ROM in all joints; non-tender to palpation; no edema; normal capillary refill; no cyanosis, no calf tenderness or swelling    SKIN: Normal color for age and race; warm; no rash NEURO: Moves all extremities equally, sensation to light touch intact diffusely, cranial nerves II through XII intact, normal speech PSYCH: The patient's mood and manner are appropriate. Grooming and personal hygiene are appropriate.  MEDICAL DECISION MAKING: Patient here with lower abdominal pain. Differential diagnosis includes diverticulitis, colitis, UTI, bowel obstruction. We'll obtain CT scan for further evaluation. Will obtain labs, urine.  Will give IV fluids, morphine, Zofran and reassess.  ED PROGRESS: Patient's labs are unremarkable. Urine shows hemoglobin, large leukocytes and rare bacteria. We'll send urine culture.  2:10 AM  Pt reports feeling much better but is still having some pain. We'll give an additional dose of Dilaudid as morphine did not help with her pain. CT scan shows mild diverticulitis without abscess or perforation. No other acute abnormalities. She is now having dysuria and I do feel she also has a UTI. Will treat with Cipro and Flagyl. Have offered her admission the patient would like to go home and I feel this is reasonable. We'll discharge with prescription for Percocet, Zofran, Cipro, Flagyl. We'll give outpatient GI follow-up. Discussed bland diet for the next several days. Discussed return precautions. She is comfortable with this plan.   At this time, I do not feel there is any life-threatening condition present. I have reviewed and discussed all results (EKG, imaging, lab, urine as appropriate) and exam findings with patient/family. I have reviewed nursing notes and appropriate previous records.  I feel the  patient is safe to be discharged home without further emergent workup and can continue workup as an outpatient as needed. Discussed usual and customary return precautions. Patient/family verbalize understanding and are comfortable with this plan.  Outpatient follow-up has been provided. All questions have been answered.      [place], DO 03/14/16 0210

## 2016-03-14 NOTE — ED Notes (Signed)
ED Provider at bedside. 

## 2016-03-14 NOTE — ED Notes (Signed)
Pt returned from ct,  

## 2016-03-15 ENCOUNTER — Encounter (INDEPENDENT_AMBULATORY_CARE_PROVIDER_SITE_OTHER): Payer: Self-pay | Admitting: Internal Medicine

## 2016-03-15 ENCOUNTER — Ambulatory Visit (INDEPENDENT_AMBULATORY_CARE_PROVIDER_SITE_OTHER): Payer: Self-pay | Admitting: Internal Medicine

## 2016-03-15 VITALS — BP 90/60 | HR 72 | Temp 98.9°F | Ht 67.5 in | Wt 190.8 lb

## 2016-03-15 DIAGNOSIS — K5732 Diverticulitis of large intestine without perforation or abscess without bleeding: Secondary | ICD-10-CM

## 2016-03-15 HISTORY — DX: Diverticulitis of large intestine without perforation or abscess without bleeding: K57.32

## 2016-03-15 NOTE — Patient Instructions (Addendum)
Continue the Cipro and Flagyl. If pain worsens, go to the ED.  Diverticulosis diet given to patient.  OV in 6 months

## 2016-03-15 NOTE — Progress Notes (Addendum)
   Subjective:    Patient ID: [name], female    DOB: 11-09-1955, 61 y.o.   MRN: [id number]  HPIHere today for f/u after recent visit to the ED for abdominal pain. She had the pain since Thursday.  Underwent a CT which revealed mild sigmoid diverticulitis.  She has hx of same. She is covered with Cipro and Flagyl.  She is covered x 10 days. Bland diet advised.  Today she feels all right.  She says her pain is better.  No fever. She is following a bland diet. Her abdominal pain is better. She says she has some swelling. Her last BM was yesterday.  She says in the past year she has had 2 flare of diverticulitis.    03/14/2015 CT abdomen/pelvis with CM: Lower abdominal pain:  IMPRESSION: Mild acute diverticulitis of sigmoid colon in the left lower quadrant. No evidence for perforation or abscess.  Urinalysis    Component Value Date/Time   COLORURINE YELLOW 03/13/2016 2332   APPEARANCEUR HAZY (A) 03/13/2016 2332   LABSPEC 1.020 03/13/2016 2332   PHURINE 5.0 03/13/2016 2332   GLUCOSEU NEGATIVE 03/13/2016 2332   HGBUR SMALL (A) 03/13/2016 2332   BILIRUBINUR NEGATIVE 03/13/2016 2332   KETONESUR NEGATIVE 03/13/2016 2332   PROTEINUR NEGATIVE 03/13/2016 2332   UROBILINOGEN 0.2 03/10/2010 2045   NITRITE NEGATIVE 03/13/2016 2332   LEUKOCYTESUR LARGE (A) 03/13/2016 2332      CBC    Component Value Date/Time   WBC 7.4 03/13/2016 2335   RBC 4.31 03/13/2016 2335   HGB 14.7 03/13/2016 2335   HCT 44.7 03/13/2016 2335   PLT 208 03/13/2016 2335   MCV 103.7 (H) 03/13/2016 2335   MCH 34.1 (H) 03/13/2016 2335   MCHC 32.9 03/13/2016 2335   RDW 12.5 03/13/2016 2335   LYMPHSABS 2.2 03/13/2016 2335   MONOABS 0.7 03/13/2016 2335   EOSABS 0.1 03/13/2016 2335   BASOSABS 0.0 03/13/2016 2335      04/09/2010  Colonoscopy.  INDICATION:  Paytin is 61 year old Caucasian female with several week history of diarrhea and negative stool studies.  Her last colonoscopy was over 3  years ago with removal of a small tubular adenoma.  She is undergoing diagnostic colonoscopy.  Procedures were reviewed with the patient.  Informed consent was obtained.  FINAL DIAGNOSES: 1. Normal terminal ileum. 2. Mild left-sided diverticulosis. 3. Normal colonic mucosa.  Biopsies taken from sigmoid colon looking     for microscopic and/or collagenous colitis. Biopsy: Lymphocytic colitis. Review of Systems     Objective:   Physical Exam Blood pressure 90/60, pulse 72, temperature 98.9 F (37.2 C), height 5' 7.5" (1.715 m), weight 190 lb 12.8 oz (86.5 kg). Alert and oriented. Skin warm and dry. Oral mucosa is moist.   . Sclera anicteric, conjunctivae is pink. Thyroid not enlarged. No cervical lymphadenopathy. Lungs clear. Heart regular rate and rhythm.  Abdomen is soft. Bowel sounds are positive. No hepatomegaly. No abdominal masses felt. Tenderness to supra pubic area.   No edema to lower extremities.          Assessment & Plan:  Diverticulitis. Continue the Cipro and Flagyl. Bland diet for next 5 days. If pain worsens, go to the ED.  OV in 6 months.  Diverticular diet given to patient.

## 2016-03-16 LAB — URINE CULTURE: Culture: 60000 — AB

## 2016-03-17 ENCOUNTER — Telehealth (HOSPITAL_BASED_OUTPATIENT_CLINIC_OR_DEPARTMENT_OTHER): Payer: Self-pay | Admitting: *Deleted

## 2016-03-17 NOTE — Telephone Encounter (Signed)
Post ED Visit - Positive Culture Follow-up  Culture report reviewed by antimicrobial stewardship pharmacist:  []  Elenor Quinones, Pharm.D. []  Heide Guile, Pharm.D., BCPS []  Parks Neptune, Pharm.D. []  Alycia Rossetti, Pharm.D., BCPS []  Warner Robins, Pharm.D., BCPS, AAHIVP []  Legrand Como, Pharm.D., BCPS, AAHIVP []  Milus Glazier, Pharm.D. []  Stephens November, [place].D. Maggie Shuda, Pharm D Kayla Rose, PA-C  Positive urine culture  No further patient follow-up is required at this time.  Harlon Flor Talley 03/17/2016, 10:14 AM

## 2016-04-08 ENCOUNTER — Ambulatory Visit (INDEPENDENT_AMBULATORY_CARE_PROVIDER_SITE_OTHER): Payer: BLUE CROSS/BLUE SHIELD | Admitting: Internal Medicine

## 2016-04-08 ENCOUNTER — Encounter (INDEPENDENT_AMBULATORY_CARE_PROVIDER_SITE_OTHER): Payer: Self-pay | Admitting: Internal Medicine

## 2016-04-08 VITALS — BP 100/72 | HR 65 | Temp 97.8°F | Ht 67.0 in | Wt 184.6 lb

## 2016-04-08 DIAGNOSIS — R112 Nausea with vomiting, unspecified: Secondary | ICD-10-CM | POA: Diagnosis not present

## 2016-04-08 LAB — CBC WITH DIFFERENTIAL/PLATELET
Basophils Absolute: 44 cells/uL (ref 0–200)
Basophils Relative: 1 %
Eosinophils Absolute: 44 cells/uL (ref 15–500)
Eosinophils Relative: 1 %
HCT: 44.9 % (ref 35.0–45.0)
Hemoglobin: 15.3 g/dL (ref 11.7–15.5)
Lymphocytes Relative: 23 %
Lymphs Abs: 1012 cells/uL (ref 850–3900)
MCH: 33.2 pg — ABNORMAL HIGH (ref 27.0–33.0)
MCHC: 34.1 g/dL (ref 32.0–36.0)
MCV: 97.4 fL (ref 80.0–100.0)
MPV: 8.6 fL (ref 7.5–12.5)
Monocytes Absolute: 748 cells/uL (ref 200–950)
Monocytes Relative: 17 %
Neutro Abs: 2552 cells/uL (ref 1500–7800)
Neutrophils Relative %: 58 %
Platelets: 165 10*3/uL (ref 140–400)
RBC: 4.61 MIL/uL (ref 3.80–5.10)
RDW: 12.7 % (ref 11.0–15.0)
WBC: 4.4 10*3/uL (ref 3.8–10.8)

## 2016-04-08 LAB — COMPREHENSIVE METABOLIC PANEL
ALT: 16 U/L (ref 6–29)
AST: 17 U/L (ref 10–35)
Albumin: 4 g/dL (ref 3.6–5.1)
Alkaline Phosphatase: 66 U/L (ref 33–130)
BUN: 9 mg/dL (ref 7–25)
CO2: 29 mmol/L (ref 20–31)
Calcium: 9.2 mg/dL (ref 8.6–10.4)
Chloride: 100 mmol/L (ref 98–110)
Creat: 0.94 mg/dL (ref 0.50–1.05)
Glucose, Bld: 92 mg/dL (ref 65–99)
Potassium: 3.4 mmol/L — ABNORMAL LOW (ref 3.5–5.3)
Sodium: 139 mmol/L (ref 135–146)
Total Bilirubin: 0.7 mg/dL (ref 0.2–1.2)
Total Protein: 6.2 g/dL (ref 6.1–8.1)

## 2016-04-08 NOTE — Progress Notes (Signed)
   Subjective:    Patient ID: [name], female    DOB: 09/09/1955, 61 y.o.   MRN: [id number]  HPI Here today for f/u In January she underwent a CT which reveal mild acute diverticulitis. Covered with Cipro and Flagyl.  She says last weekend she felt good. Sunday she has had some nausea. She had diarrhea and vomiting. No fever.  . She says she is still nauseated. She is trying to stay on a bland diet.  There is no pain.  She is averaging 6-8 stools a day. They have some substance. She has not taken any imodium.   03/14/2016 CT abdomen/pelvis with CM: lower abdominal pain. Hx of diverticulitis:  IMPRESSION: Mild acute diverticulitis of sigmoid colon in the left lowe r quadrant. No evidence for perforation or abscess.   04/09/2010 Colonoscopy: diarrhea and negative stool studies:  Hx of tubular adenoma.   FINAL DIAGNOSES: 1. Normal terminal ileum. 2. Mild left-sided diverticulosis. 3. Normal colonic mucosa.  Biopsies taken from sigmoid colon looking     for microscopic and/or collagenous colitis. Biopsy: Lymphocytic colitis. Review of Systems Past Medical History:  Diagnosis Date  . Bipolar 1 disorder ([place])   . Depression   . Diverticulitis   . Diverticulitis of colon 03/15/2016    Past Surgical History:  Procedure Laterality Date  . oophrorectomy      No Known Allergies  Current Outpatient Prescriptions on File Prior to Visit  Medication Sig Dispense Refill  . ALPRAZolam (XANAX) 0.5 MG tablet Take 1 mg by mouth at bedtime as needed for anxiety.     Marland Kitchen buPROPion (WELLBUTRIN XL) 150 MG 24 hr tablet Take 450 mg by mouth daily.     Marland Kitchen lamoTRIgine (LAMICTAL) 100 MG tablet Take by mouth 2 (two) times daily.     . ondansetron (ZOFRAN ODT) 4 MG disintegrating tablet Take 1 tablet (4 mg total) by mouth every 8 (eight) hours as needed for nausea or vomiting. 20 tablet 0  . TraZODone & Diet Manage Prod (TRAZAMINE PO) Take by mouth as needed.    . traZODone (DESYREL) 100 MG  tablet Take 100 mg by mouth at bedtime.    . ciprofloxacin (CIPRO) 500 MG tablet Take 1 tablet (500 mg total) by mouth 2 (two) times daily. (Patient not taking: Reported on 04/08/2016) 20 tablet 0  . metroNIDAZOLE (FLAGYL) 500 MG tablet Take 1 tablet (500 mg total) by mouth 3 (three) times daily. (Patient not taking: Reported on 04/08/2016) 30 tablet 0  . oxyCODONE-acetaminophen (PERCOCET/ROXICET) 5-325 MG tablet Take 1-2 tablets by mouth every 6 (six) hours as needed. (Patient not taking: Reported on 04/08/2016) 20 tablet 0   No current facility-administered medications on file prior to visit.        Objective:   Physical Exam Blood pressure 100/72, pulse 65, temperature 97.8 F (36.6 C), height 5\' 7"  (1.702 m), weight 184 lb 9.6 oz (83.7 kg). Alert and oriented. Skin warm and dry. Oral mucosa is moist.   . Sclera anicteric, conjunctivae is pink. Thyroid not enlarged. No cervical lymphadenopathy. Lungs clear. Heart regular rate and rhythm.  Abdomen is soft. Bowel sounds are positive. No hepatomegaly. No abdominal masses felt. No tenderness.  No edema to lower extremities.          Assessment & Plan:  Viral gastroenteritis.  Needs to stay; on a bland diet.  CBC and CMET.

## 2016-04-08 NOTE — Patient Instructions (Signed)
CBC and CMET 

## 2016-06-25 ENCOUNTER — Ambulatory Visit (INDEPENDENT_AMBULATORY_CARE_PROVIDER_SITE_OTHER): Payer: BLUE CROSS/BLUE SHIELD | Admitting: Family Medicine

## 2016-06-25 ENCOUNTER — Encounter: Payer: Self-pay | Admitting: Family Medicine

## 2016-06-25 VITALS — BP 118/76 | HR 68 | Temp 96.7°F | Resp 18 | Ht 67.0 in | Wt 155.1 lb

## 2016-06-25 DIAGNOSIS — Z720 Tobacco use: Secondary | ICD-10-CM | POA: Diagnosis not present

## 2016-06-25 DIAGNOSIS — Z7689 Persons encountering health services in other specified circumstances: Secondary | ICD-10-CM

## 2016-06-25 HISTORY — DX: Tobacco use: Z72.0

## 2016-06-25 NOTE — Progress Notes (Signed)
Chief Complaint  Patient presents with  . Establish Care   Patient is here new to establish. Her old records are requested. She states she is up-to-date with her medical care including Pap smear mammogram and colonoscopy. A family practice provider. She sees a GYN provider.  Under the care of psychiatry for bipolar illness. She states she is compliant with taking her medications. Assistance of diverticulitis in the past. She is on a diet avoiding seeds. We discussed a high-fiber diet is recommended, she does not need to avoid seeds.  I have discussed the multiple health risks associated with cigarette smoking including, but not limited to, cardiovascular disease, lung disease and cancer.  I have strongly recommended that smoking be stopped.  I have reviewed the various methods of quitting including cold [place], classes, nicotine replacements and prescription medications.  I have offered assistance in this difficult process.  The patient is not interested in assistance at this time. May 1. She is afraid to quit smoking because of weight gain.  Tetanus shot 2 years ago. She states her immunizations are up-to-date.   Patient Active Problem List   Diagnosis Date Noted  . Tobacco abuse 06/25/2016  . Diverticulitis of colon 03/15/2016  . Bipolar disorder ([place]) 12/12/2013    Outpatient Encounter Prescriptions as of 06/25/2016  Medication Sig  . ALPRAZolam (XANAX) 0.5 MG tablet Take 1 mg by mouth at bedtime as needed for anxiety.   Marland Kitchen buPROPion (WELLBUTRIN XL) 150 MG 24 hr tablet Take 450 mg by mouth daily.   Marland Kitchen lamoTRIgine (LAMICTAL) 100 MG tablet Take by mouth 2 (two) times daily.   . traZODone (DESYREL) 100 MG tablet Take 100 mg by mouth at bedtime.   No facility-administered encounter medications on file as of 06/25/2016.     Past Medical History:  Diagnosis Date  . Anxiety   . Bipolar 1 disorder ([place])   . Depression   . Diverticulitis   . Diverticulitis of colon 03/15/2016    Past  Surgical History:  Procedure Laterality Date  . COLONOSCOPY    . oophrorectomy    . TUBAL LIGATION      Social History   Social History  . Marital status: Divorced    Spouse name: N/A  . Number of children: 2  . Years of education: 13   Occupational History  . 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 Smoking status: Current Some Day Smoker    Packs/day: 0.50    Types: Cigarettes  . Smokeless tobacco: Never Used     Comment: none in 9 months. after smoking 1 pack a day x 20 yrs.  . Alcohol use Yes     Comment: occassionally  . Drug use: No  . Sexual activity: Not Currently    Birth control/ protection: Post-menopausal   Other Topics Concern  . Not on file   Social History Narrative   Lives alone   Stays active at work, walks and stairs, also walks a mile a day    Family History  Problem Relation Age of Onset  . Cancer Mother     lichen planus - oral cancer  . Cancer Father     lung  . Cancer Maternal Uncle     uterine  . Diabetes Maternal Uncle   . Stroke Maternal Grandmother   . COPD Paternal Grandfather     Review of Systems  Constitutional: Negative for chills, fever and weight loss.  HENT: Negative for congestion and hearing loss.   Eyes: Negative for blurred vision and pain.  Respiratory: Negative for cough and shortness of breath.   Cardiovascular: Negative for chest pain and leg swelling.  Gastrointestinal: Negative for abdominal pain, constipation, diarrhea and heartburn.  Genitourinary: Negative for dysuria and frequency.  Musculoskeletal: Negative for falls, joint pain and myalgias.  Neurological: Negative for dizziness, seizures and headaches.  Psychiatric/Behavioral: Negative for depression. The patient is not nervous/anxious and does not have insomnia.     BP 118/76 (BP Location: Right Arm, Patient Position: Sitting, Cuff Size: Normal)   Pulse 68   Temp (!) 96.7 F (35.9 C) (Temporal)   Resp 18    Ht 5\' 7"  (1.702 m)   Wt 155 lb 1.3 oz (70.3 kg)   SpO2 98%   BMI 24.29 kg/m   Physical Exam  Constitutional: She is oriented to person, place, and time. She appears well-developed and well-nourished.  HENT:  Head: Normocephalic and atraumatic.  Mouth/Throat: Oropharynx is clear and moist.  Eyes: Conjunctivae are normal. Pupils are equal, round, and reactive to light.  Neck: Normal range of motion. Neck supple. No thyromegaly present.  Cardiovascular: Normal rate, regular rhythm and normal heart sounds.   Pulmonary/Chest: Effort normal and breath sounds normal. No respiratory distress.  Abdominal: Soft. Bowel sounds are normal.  Musculoskeletal: Normal range of motion. She exhibits no edema.  Lymphadenopathy:    She has no cervical adenopathy.  Neurological: She is alert and oriented to person, place, and time.  Gait normal  Skin: Skin is warm and dry.  Psychiatric: She has a normal mood and affect. Her behavior is normal. Thought content normal.  Mildly hyperkinetic.   Nursing note and vitals reviewed.   1. Encounter to establish care with new doctor   2. Tobacco abuse Smoking cessation discussed.   Patient Instructions  Continue to walk every day that you are able Take a Calcium with vitamin D daily See me yearly  GET YOUR MAMMOGRAM  I will contact GI about the need for colonoscopy  Need old records  See me yearly Call sooner for problems   Raylene Everts, MD

## 2016-06-25 NOTE — Patient Instructions (Signed)
Continue to walk every day that you are able Take a Calcium with vitamin D daily See me yearly  GET YOUR MAMMOGRAM  I will contact GI about the need for colonoscopy  Need old records  See me yearly Call sooner for problems

## 2016-09-11 ENCOUNTER — Encounter (HOSPITAL_COMMUNITY): Payer: Self-pay | Admitting: *Deleted

## 2016-09-11 ENCOUNTER — Emergency Department (HOSPITAL_COMMUNITY)
Admission: EM | Admit: 2016-09-11 | Discharge: 2016-09-11 | Disposition: A | Discharge status: Home / Self Care | Payer: BLUE CROSS/BLUE SHIELD | Attending: Emergency Medicine | Admitting: Emergency Medicine

## 2016-09-11 ENCOUNTER — Emergency Department (HOSPITAL_COMMUNITY): Payer: BLUE CROSS/BLUE SHIELD

## 2016-09-11 DIAGNOSIS — Z87891 Personal history of nicotine dependence: Secondary | ICD-10-CM | POA: Diagnosis not present

## 2016-09-11 DIAGNOSIS — Z79891 Long term (current) use of opiate analgesic: Secondary | ICD-10-CM | POA: Diagnosis not present

## 2016-09-11 DIAGNOSIS — Z79899 Other long term (current) drug therapy: Secondary | ICD-10-CM | POA: Diagnosis not present

## 2016-09-11 DIAGNOSIS — K5792 Diverticulitis of intestine, part unspecified, without perforation or abscess without bleeding: Secondary | ICD-10-CM | POA: Diagnosis not present

## 2016-09-11 DIAGNOSIS — R1032 Left lower quadrant pain: Secondary | ICD-10-CM | POA: Diagnosis present

## 2016-09-11 LAB — COMPREHENSIVE METABOLIC PANEL
ALT: 17 U/L (ref 14–54)
AST: 17 U/L (ref 15–41)
Albumin: 4.1 g/dL (ref 3.5–5.0)
Alkaline Phosphatase: 93 U/L (ref 38–126)
Anion gap: 10 (ref 5–15)
BUN: 11 mg/dL (ref 6–20)
CO2: 26 mmol/L (ref 22–32)
Calcium: 9.5 mg/dL (ref 8.9–10.3)
Chloride: 105 mmol/L (ref 101–111)
Creatinine, Ser: 0.64 mg/dL (ref 0.44–1.00)
GFR calc Af Amer: >60 mL/min (ref >60)
GFR calc non Af Amer: >60 mL/min (ref >60)
Glucose, Bld: 122 mg/dL — ABNORMAL HIGH (ref 65–99)
Potassium: 4.1 mmol/L (ref 3.5–5.1)
Sodium: 141 mmol/L (ref 135–145)
Total Bilirubin: 0.7 mg/dL (ref 0.3–1.2)
Total Protein: 6.8 g/dL (ref 6.5–8.1)

## 2016-09-11 LAB — URINALYSIS, ROUTINE W REFLEX MICROSCOPIC
Bacteria, UA: NONE SEEN
Bilirubin Urine: NEGATIVE
Glucose, UA: NEGATIVE mg/dL
Ketones, ur: NEGATIVE mg/dL
Nitrite: NEGATIVE
Protein, ur: NEGATIVE mg/dL
Specific Gravity, Urine: 1.015 (ref 1.005–1.030)
pH: 6 (ref 5.0–8.0)

## 2016-09-11 LAB — CBC
HCT: 43.4 % (ref 36.0–46.0)
Hemoglobin: 14.4 g/dL (ref 12.0–15.0)
MCH: 33.5 pg (ref 26.0–34.0)
MCHC: 33.2 g/dL (ref 30.0–36.0)
MCV: 100.9 fL — ABNORMAL HIGH (ref 78.0–100.0)
Platelets: 204 10*3/uL (ref 150–400)
RBC: 4.3 MIL/uL (ref 3.87–5.11)
RDW: 12.9 % (ref 11.5–15.5)
WBC: 6.9 10*3/uL (ref 4.0–10.5)

## 2016-09-11 LAB — LIPASE, BLOOD: Lipase: 29 U/L (ref 11–51)

## 2016-09-11 MED ORDER — METRONIDAZOLE 500 MG PO TABS: 500.0000 mg | ORAL_TABLET | Freq: Three times a day (TID) | ORAL | 0 refills | Status: DC

## 2016-09-11 MED ORDER — CIPROFLOXACIN IN D5W 400 MG/200ML IV SOLN
400.0000 mg | Freq: Once | INTRAVENOUS | Status: AC
  Administered 2016-09-11: 400 mg via INTRAVENOUS
  Filled 2016-09-11: qty 200

## 2016-09-11 MED ORDER — OXYCODONE-ACETAMINOPHEN 5-325 MG PO TABS: 1.0000 | ORAL_TABLET | Freq: Four times a day (QID) | ORAL | 0 refills | Status: DC | PRN

## 2016-09-11 MED ORDER — CIPROFLOXACIN HCL 500 MG PO TABS: 500.0000 mg | ORAL_TABLET | Freq: Two times a day (BID) | ORAL | 0 refills | Status: DC

## 2016-09-11 MED ORDER — FENTANYL CITRATE (PF) 100 MCG/2ML IJ SOLN
50.0000 ug | Freq: Once | INTRAMUSCULAR | Status: AC
  Administered 2016-09-11: 50 ug via INTRAVENOUS
  Filled 2016-09-11: qty 2

## 2016-09-11 MED ORDER — ONDANSETRON HCL 4 MG/2ML IJ SOLN
4.0000 mg | Freq: Once | INTRAMUSCULAR | Status: AC
  Administered 2016-09-11: 4 mg via INTRAVENOUS
  Filled 2016-09-11: qty 2

## 2016-09-11 MED ORDER — METRONIDAZOLE IN NACL 5-0.79 MG/ML-% IV SOLN
500.0000 mg | Freq: Once | INTRAVENOUS | Status: AC
  Administered 2016-09-11: 500 mg via INTRAVENOUS
  Filled 2016-09-11: qty 100

## 2016-09-11 MED ORDER — SODIUM CHLORIDE 0.9 % IV BOLUS (SEPSIS)
1000.0000 mL | Freq: Once | INTRAVENOUS | Status: AC
  Administered 2016-09-11: 1000 mL via INTRAVENOUS

## 2016-09-11 MED ORDER — IOPAMIDOL (ISOVUE-300) INJECTION 61%
100.0000 mL | Freq: Once | INTRAVENOUS | Status: AC | PRN
  Administered 2016-09-11: 100 mL via INTRAVENOUS

## 2016-09-11 MED ORDER — ONDANSETRON HCL 4 MG PO TABS: 4.0000 mg | ORAL_TABLET | Freq: Three times a day (TID) | ORAL | 0 refills | Status: DC | PRN

## 2016-09-11 MED ORDER — OXYCODONE-ACETAMINOPHEN 5-325 MG PO TABS
1.0000 | ORAL_TABLET | Freq: Once | ORAL | Status: AC
  Administered 2016-09-11: 1 via ORAL
  Filled 2016-09-11: qty 1

## 2016-09-11 NOTE — ED Triage Notes (Signed)
Pt C/O lower abdominal pain that started this morning. Pt has hx of diverticulitis.

## 2016-09-11 NOTE — Discharge Instructions (Signed)
Drink plenty of fluids for the next 24 hrs. Start a bland diet on Sunday if you are doing better. Take the antibiotics until gone. Use the percocet for severe pain, use the zofran for nausea or vomiting. Keep your appointment with Dr Laural Golden on Monday, July 9.   Return to the ED if you get a fever or have uncontrolled vomiting or pain.

## 2016-09-11 NOTE — ED Provider Notes (Signed)
[facility] DEPT Provider Note   CSN: [id number] Arrival date & time: 09/11/16  0345  Time seen 04:15 AM   History   Chief Complaint Chief Complaint  Patient presents with  . Abdominal Pain    HPI [name] is a 61 y.o. female.  HPI  patient reports she woke up at 7 AM on July 6 with left lower quadrant abdominal discomfort that has been getting progressively worse throughout the day. She reports she was awakened at 3 AM this morning with the pain being very intense. She states there is a constant pain is cramping and then at times there is a sharp pain. She has had nausea without vomiting. She denies fever, diarrhea, rectal bleeding, and states her last bowel movement was normal. She states she's had diverticulitis in the past several times. She states she was only admitted the first time. She states this episode is similar to those episodes and is the worst she's ever had. She states she is unable to stand up straight because the pain gets a lot worse, bending over makes the pain a little better. She states she has been eating a lot of tomatoes and corn, but denies eating popcorn or peanuts.  Patient states she was diagnosed with bronchitis about 4 days ago and has been on amoxicillin.  PCP Shelia Everts, MD GI Dr [name], has a regular appt on July 9  Past Medical History:  Diagnosis Date  . Anxiety   . Bipolar 1 disorder ([place])   . Depression   . Diverticulitis   . Diverticulitis of colon 03/15/2016    Patient Active Problem List   Diagnosis Date Noted  . Tobacco abuse 06/25/2016  . Diverticulitis of colon 03/15/2016  . Bipolar disorder ([place]) 12/12/2013    Past Surgical History:  Procedure Laterality Date  . COLONOSCOPY    . oophrorectomy    . TUBAL LIGATION      OB History    Gravida Para Term Preterm AB Living             2   SAB TAB Ectopic Multiple Live Births                   Home Medications    Prior to Admission medications     Medication Sig Start Date End Date Taking? Authorizing Provider  ALPRAZolam Duanne Moron) 0.5 MG tablet Take 1 mg by mouth at bedtime as needed for anxiety.    Yes [provider]  buPROPion (WELLBUTRIN XL) 150 MG 24 hr tablet Take 450 mg by mouth daily.    Yes [provider]  lamoTRIgine (LAMICTAL) 100 MG tablet Take by mouth 2 (two) times daily.    Yes [provider]  traZODone (DESYREL) 100 MG tablet Take 100 mg by mouth at bedtime.   Yes [provider]  ciprofloxacin (CIPRO) 500 MG tablet Take 1 tablet (500 mg total) by mouth 2 (two) times daily. 09/11/16   Rolland Porter, MD  metroNIDAZOLE (FLAGYL) 500 MG tablet Take 1 tablet (500 mg total) by mouth 3 (three) times daily. 09/11/16   Rolland Porter, MD  ondansetron (ZOFRAN) 4 MG tablet Take 1 tablet (4 mg total) by mouth every 8 (eight) hours as needed for nausea or vomiting. 09/11/16   Rolland Porter, MD  oxyCODONE-acetaminophen (PERCOCET/ROXICET) 5-325 MG tablet Take 1 tablet by mouth every 6 (six) hours as needed for severe pain. 09/11/16   Rolland Porter, MD    Family History Family History  Problem Relation Age of Onset  . Cancer Mother        lichen planus - oral cancer  . Cancer Father        lung  . Cancer Maternal Uncle        uterine  . Diabetes Maternal Uncle   . Stroke Maternal Grandmother   . COPD Paternal Grandfather     Social History Social History  Substance Use Topics  . Smoking status: Former Smoker    Packs/day: 0.50    Types: Cigarettes    Quit date: 09/02/2016  . Smokeless tobacco: Never Used     Comment: none in 9 months. after smoking 1 pack a day x 20 yrs.  . Alcohol use Yes     Comment: occassionally  employed Quit smoking 10 days ago   Allergies   Patient has no known allergies.   Review of Systems Review of Systems  All other systems reviewed and are negative.    Physical Exam Updated Vital Signs BP 119/77 (BP Location: Left Arm)   Pulse 81   Temp 98.1 F (36.7 C) (Oral)    Resp 19   Ht 5\' 7"  (1.702 m)   Wt 79.8 kg (176 lb)   SpO2 96%   BMI 27.57 kg/m   Vital signs normal    Physical Exam  Constitutional: She is oriented to person, place, and time. She appears well-developed and well-nourished.  Non-toxic appearance. She does not appear ill. No distress.  HENT:  Head: Normocephalic and atraumatic.  Right Ear: External ear normal.  Left Ear: External ear normal.  Nose: Nose normal. No mucosal edema or rhinorrhea.  Mouth/Throat: Mucous membranes are dry. No dental abscesses or uvula swelling.  Eyes: Conjunctivae and EOM are normal. Pupils are equal, round, and reactive to light.  Neck: Normal range of motion and full passive range of motion without pain. Neck supple.  Cardiovascular: Normal rate, regular rhythm and normal heart sounds.  Exam reveals no gallop and no friction rub.   No murmur heard. Pulmonary/Chest: Effort normal and breath sounds normal. No respiratory distress. She has no wheezes. She has no rhonchi. She has no rales. She exhibits no tenderness and no crepitus.  Abdominal: Soft. Normal appearance and bowel sounds are normal. She exhibits no distension. There is tenderness in the left lower quadrant. There is guarding. There is no rebound.    Musculoskeletal: Normal range of motion. She exhibits no edema or tenderness.  Moves all extremities well.   Neurological: She is alert and oriented to person, place, and time. She has normal strength. No cranial nerve deficit.  Skin: Skin is warm, dry and intact. No rash noted. No erythema. No pallor.  Psychiatric: She has a normal mood and affect. Her speech is normal and behavior is normal. Her mood appears not anxious.  Nursing note and vitals reviewed.    ED Treatments / Results  Labs (all labs ordered are listed, but only abnormal results are displayed) Results for orders placed or performed during the hospital encounter of 09/11/16  Lipase, blood  Result Value Ref Range   Lipase 29  11 - 51 U/L  Comprehensive metabolic panel  Result Value Ref Range   Sodium 141 135 - 145 mmol/L   Potassium 4.1 3.5 - 5.1 mmol/L   Chloride 105 101 - 111 mmol/L   CO2 26 22 - 32 mmol/L   Glucose, Bld 122 (H) 65 - 99 mg/dL   BUN 11 6 - 20 mg/dL  Creatinine, Ser 0.64 0.44 - 1.00 mg/dL   Calcium 9.5 8.9 - 10.3 mg/dL   Total Protein 6.8 6.5 - 8.1 g/dL   Albumin 4.1 3.5 - 5.0 g/dL   AST 17 15 - 41 U/L   ALT 17 14 - 54 U/L   Alkaline Phosphatase 93 38 - 126 U/L   Total Bilirubin 0.7 0.3 - 1.2 mg/dL   GFR calc non Af Amer >60 >60 mL/min   GFR calc Af Amer >60 >60 mL/min   Anion gap 10 5 - 15  CBC  Result Value Ref Range   WBC 6.9 4.0 - 10.5 K/uL   RBC 4.30 3.87 - 5.11 MIL/uL   Hemoglobin 14.4 12.0 - 15.0 g/dL   HCT 43.4 36.0 - 46.0 %   MCV 100.9 (H) 78.0 - 100.0 fL   MCH 33.5 26.0 - 34.0 pg   MCHC 33.2 30.0 - 36.0 g/dL   RDW 12.9 11.5 - 15.5 %   Platelets 204 150 - 400 K/uL  Urinalysis, Routine w reflex microscopic  Result Value Ref Range   Color, Urine YELLOW YELLOW   APPearance CLEAR CLEAR   Specific Gravity, Urine 1.015 1.005 - 1.030   pH 6.0 5.0 - 8.0   Glucose, UA NEGATIVE NEGATIVE mg/dL   Hgb urine dipstick SMALL (A) NEGATIVE   Bilirubin Urine NEGATIVE NEGATIVE   Ketones, ur NEGATIVE NEGATIVE mg/dL   Protein, ur NEGATIVE NEGATIVE mg/dL   Nitrite NEGATIVE NEGATIVE   Leukocytes, UA SMALL (A) NEGATIVE   RBC / HPF 6-30 0 - 5 RBC/hpf   WBC, UA 0-5 0 - 5 WBC/hpf   Bacteria, UA NONE SEEN NONE SEEN   Squamous Epithelial / LPF 0-5 (A) NONE SEEN   Laboratory interpretation all normal except some microscopic hematuria    EKG  EKG Interpretation None       Radiology Ct Abdomen Pelvis W Contrast  Result Date: 09/11/2016 CLINICAL DATA:  Acute onset of left lower quadrant abdominal pain, congestion and cough. Initial encounter. EXAM: CT ABDOMEN AND PELVIS WITH CONTRAST TECHNIQUE: Multidetector CT imaging of the abdomen and pelvis was performed using the standard  protocol following bolus administration of intravenous contrast. CONTRAST:  145mL ISOVUE-300 IOPAMIDOL (ISOVUE-300) INJECTION 61% COMPARISON:  CT of the abdomen and pelvis from 03/14/2016 FINDINGS: Lower chest: Minimal bibasilar atelectasis is noted. The visualized portions of the mediastinum are unremarkable. Hepatobiliary: The liver is unremarkable in appearance. The gallbladder is unremarkable in appearance. The common bile duct remains normal in caliber. Pancreas: The pancreas is within normal limits. Spleen: The spleen is unremarkable in appearance. Adrenals/Urinary Tract: The adrenal glands are unremarkable in appearance. The kidneys are within normal limits. There is no evidence of hydronephrosis. No renal or ureteral stones are identified. No perinephric stranding is seen. Stomach/Bowel: The stomach is unremarkable in appearance. The small bowel is within normal limits. The appendix is normal in caliber, without evidence of appendicitis. The colon is unremarkable in appearance. Focal wall thickening and soft tissue inflammation is noted at the proximal sigmoid colon, with inflamed diverticula compatible with acute diverticulitis. There is no evidence of perforation or abscess formation at this time. Scattered diverticulosis is noted along the descending and sigmoid colon. Vascular/Lymphatic: The abdominal aorta is unremarkable in appearance. The inferior vena cava is grossly unremarkable. No retroperitoneal lymphadenopathy is seen. No pelvic sidewall lymphadenopathy is identified. Reproductive: The bladder is mildly distended and grossly unremarkable. The uterus is unremarkable in appearance. A ring pessary is noted. No suspicious adnexal masses are  seen. Other: No additional soft tissue abnormalities are seen. Musculoskeletal: No acute osseous abnormalities are identified. The visualized musculature is unremarkable in appearance. IMPRESSION: 1. Acute diverticulitis, with focal wall thickening and soft  tissue inflammation at the proximal sigmoid colon. No evidence of perforation or abscess formation at this time. 2. Scattered diverticulosis along the descending and sigmoid colon. Electronically Signed   By: Garald Balding M.D.   On: 09/11/2016 05:22    Procedures Procedures (including critical care time)  Medications Ordered in ED Medications  sodium chloride 0.9 % bolus 1,000 mL (0 mLs Intravenous Stopped 09/11/16 0728)  sodium chloride 0.9 % bolus 1,000 mL (0 mLs Intravenous Stopped 09/11/16 0542)  ondansetron (ZOFRAN) injection 4 mg (4 mg Intravenous Given 09/11/16 0434)  fentaNYL (SUBLIMAZE) injection 50 mcg (50 mcg Intravenous Given 09/11/16 0435)  ciprofloxacin (CIPRO) IVPB 400 mg (0 mg Intravenous Stopped 09/11/16 0727)  metroNIDAZOLE (FLAGYL) IVPB 500 mg (0 mg Intravenous Stopped 09/11/16 0541)  iopamidol (ISOVUE-300) 61 % injection 100 mL (100 mLs Intravenous Contrast Given 09/11/16 0500)  oxyCODONE-acetaminophen (PERCOCET/ROXICET) 5-325 MG per tablet 1 tablet (1 tablet Oral Given 09/11/16 0731)     Initial Impression / Assessment and Plan / ED Course  I have reviewed the triage vital signs and the nursing notes.  Pertinent labs & imaging results that were available during my care of the patient were reviewed by me and considered in my medical decision making (see chart for details).   Patient was given IV fluids for dehydration, IV pain and nausea medication. CT scan of the abdomen/pelvis was done to look for diverticulitis. She states this is her worst episode, concern is for a abscess or microperforation. Her abdomen is not consistent with a large perforation. She was started on IV antibiotics.  Recheck at 05:30 AM pt given her test results. She wants to try to go home with oral pain and antibiotics.  She is still getting her IV antibiotics.   Patient was discharged home with oral Cipro and Flagyl. She was given Zofran for nausea and Percocet for pain. She already has an appointment with  her gastroenterologist on July 9. She should return for fever or worsening pain.  Review the [facility] shows patient gets #60 alprazolam every 2 months, last filled July 4 from Dr. Adelene Idler in [place].  Final Clinical Impressions(s) / ED Diagnoses   Final diagnoses:  Diverticulitis    New Prescriptions New Prescriptions   CIPROFLOXACIN (CIPRO) 500 MG TABLET    Take 1 tablet (500 mg total) by mouth 2 (two) times daily.   METRONIDAZOLE (FLAGYL) 500 MG TABLET    Take 1 tablet (500 mg total) by mouth 3 (three) times daily.   ONDANSETRON (ZOFRAN) 4 MG TABLET    Take 1 tablet (4 mg total) by mouth every 8 (eight) hours as needed for nausea or vomiting.   OXYCODONE-ACETAMINOPHEN (PERCOCET/ROXICET) 5-325 MG TABLET    Take 1 tablet by mouth every 6 (six) hours as needed for severe pain.    Plan discharge  Rolland Porter, MD, Barbette Or, MD 09/11/16 [phone]

## 2016-09-11 NOTE — ED Notes (Signed)
Pt says she has appt on Monday w/ Dr Claretta Fraise.

## 2016-09-11 NOTE — ED Notes (Signed)
Pt O2 sats dropped to 84% after receiving pain medications. Pt placed on 2L O2 via nasal canula. O2 sats returned to 96%.

## 2016-09-13 ENCOUNTER — Encounter (INDEPENDENT_AMBULATORY_CARE_PROVIDER_SITE_OTHER): Payer: Self-pay | Admitting: Internal Medicine

## 2016-09-13 ENCOUNTER — Ambulatory Visit (INDEPENDENT_AMBULATORY_CARE_PROVIDER_SITE_OTHER): Payer: BLUE CROSS/BLUE SHIELD | Admitting: Internal Medicine

## 2016-09-13 VITALS — BP 100/70 | HR 72 | Temp 98.5°F | Ht 67.0 in | Wt 174.5 lb

## 2016-09-13 DIAGNOSIS — K5732 Diverticulitis of large intestine without perforation or abscess without bleeding: Secondary | ICD-10-CM | POA: Diagnosis not present

## 2016-09-13 NOTE — Patient Instructions (Signed)
Continue the antibiotics Continue the pain medications

## 2016-09-13 NOTE — Progress Notes (Signed)
   Subjective:    Patient ID: [name], female    DOB: 1956/02/14, 61 y.o.   MRN: [id number]  HPI Here today for f/u. She was last seen in February. Hx of diverticulitis. Per epic records, she was seen in the ED 09/11/2016 with LLQ pain. CT revealed acute diverticulitis. She was covered with Cipro and Flagyl x 10 days.  She also is talking Percocet every 6 hrs. She admits she had been eating corn and fresh tomatoes.  Her appetite is okay. She finally had a BM this am.     04/09/2010 Colonoscopy.  INDICATION: [name] is 61 year old Caucasian female with several week history of diarrhea and negative stool studies. Her last colonoscopy was over 3 years ago with removal of a small tubular adenoma. She is undergoing diagnostic colonoscopy. Procedures were reviewed with the patient. Informed consent was obtained.  FINAL DIAGNOSES: 1. Normal terminal ileum. 2. Mild left-sided diverticulosis. 3. Normal colonic mucosa. Biopsies taken from sigmoid colon looking for microscopic and/or collagenous colitis.  Review of Systems Past Medical History:  Diagnosis Date  . Anxiety   . Bipolar 1 disorder ([place])   . Depression   . Diverticulitis   . Diverticulitis of colon 03/15/2016    Past Surgical History:  Procedure Laterality Date  . COLONOSCOPY    . oophrorectomy    . TUBAL LIGATION      No Known Allergies  Current Outpatient Prescriptions on File Prior to Visit  Medication Sig Dispense Refill  . ALPRAZolam (XANAX) 0.5 MG tablet Take 1 mg by mouth at bedtime as needed for anxiety.     Marland Kitchen buPROPion (WELLBUTRIN XL) 150 MG 24 hr tablet Take 450 mg by mouth daily.     . ciprofloxacin (CIPRO) 500 MG tablet Take 1 tablet (500 mg total) by mouth 2 (two) times daily. 20 tablet 0  . lamoTRIgine (LAMICTAL) 100 MG tablet Take by mouth 2 (two) times daily.     . metroNIDAZOLE (FLAGYL) 500 MG tablet Take 1 tablet (500 mg total) by mouth 3 (three) times daily. 30 tablet 0  .  ondansetron (ZOFRAN) 4 MG tablet Take 1 tablet (4 mg total) by mouth every 8 (eight) hours as needed for nausea or vomiting. 10 tablet 0  . oxyCODONE-acetaminophen (PERCOCET/ROXICET) 5-325 MG tablet Take 1 tablet by mouth every 6 (six) hours as needed for severe pain. 10 tablet 0  . traZODone (DESYREL) 100 MG tablet Take 100 mg by mouth at bedtime.     No current facility-administered medications on file prior to visit.         Objective:   Physical Exam Blood pressure 100/70, pulse 72, temperature 98.5 F (36.9 C), height 5\' 7"  (1.702 m), weight 174 lb 8 oz (79.2 kg). Alert and oriented. Skin warm and dry. Oral mucosa is moist.   . Sclera anicteric, conjunctivae is pink. Thyroid not enlarged. No cervical lymphadenopathy. Lungs clear. Heart regular rate and rhythm.  Abdomen is soft. Bowel sounds are positive. No hepatomegaly. No abdominal masses felt. No tenderness.  No edema to lower extremities.           Assessment & Plan:  Diverticulitis. She knows to go back to the ED if pain worsens. Continue the antibiotics and pain meds.

## 2016-09-27 ENCOUNTER — Telehealth (INDEPENDENT_AMBULATORY_CARE_PROVIDER_SITE_OTHER): Payer: Self-pay | Admitting: Internal Medicine

## 2016-09-27 DIAGNOSIS — R110 Nausea: Secondary | ICD-10-CM

## 2016-09-27 DIAGNOSIS — K5732 Diverticulitis of large intestine without perforation or abscess without bleeding: Secondary | ICD-10-CM

## 2016-09-27 MED ORDER — METRONIDAZOLE 500 MG PO TABS: 500.0000 mg | ORAL_TABLET | Freq: Three times a day (TID) | ORAL | 0 refills | Status: DC

## 2016-09-27 MED ORDER — ONDANSETRON HCL 4 MG PO TABS: 4.0000 mg | ORAL_TABLET | Freq: Three times a day (TID) | ORAL | 0 refills | Status: DC | PRN

## 2016-09-27 MED ORDER — CIPROFLOXACIN HCL 500 MG PO TABS: 500.0000 mg | ORAL_TABLET | Freq: Two times a day (BID) | ORAL | 0 refills | Status: DC

## 2016-09-27 NOTE — Telephone Encounter (Signed)
err

## 2016-09-27 NOTE — Telephone Encounter (Signed)
Rx sent 

## 2016-09-27 NOTE — Telephone Encounter (Signed)
Rx sent to her pharmacy 

## 2016-09-27 NOTE — Telephone Encounter (Signed)
Patient called, stated that she's going out of the country and wanted to see if [name] would give her a refill on her antibiotic just incase something happens.  She was supposed to be leaving for [place] today and that trip fell through, but she'll be leaving for [place] on Saturday.   [phone]

## 2017-03-14 ENCOUNTER — Ambulatory Visit (INDEPENDENT_AMBULATORY_CARE_PROVIDER_SITE_OTHER): Payer: BLUE CROSS/BLUE SHIELD | Admitting: Internal Medicine

## 2017-03-15 ENCOUNTER — Encounter (INDEPENDENT_AMBULATORY_CARE_PROVIDER_SITE_OTHER): Payer: Self-pay | Admitting: Internal Medicine

## 2017-03-15 ENCOUNTER — Ambulatory Visit (INDEPENDENT_AMBULATORY_CARE_PROVIDER_SITE_OTHER): Payer: BLUE CROSS/BLUE SHIELD | Admitting: Internal Medicine

## 2017-03-15 VITALS — BP 140/80 | HR 60 | Temp 98.6°F | Ht 67.0 in | Wt 173.8 lb

## 2017-03-15 DIAGNOSIS — R197 Diarrhea, unspecified: Secondary | ICD-10-CM

## 2017-03-15 MED ORDER — METRONIDAZOLE 500 MG PO TABS: 500.0000 mg | ORAL_TABLET | Freq: Two times a day (BID) | ORAL | 0 refills | Status: DC

## 2017-03-15 NOTE — Patient Instructions (Signed)
Rx for Flagyl sent to her pharmacy 

## 2017-03-15 NOTE — Progress Notes (Signed)
   Subjective:    Patient ID: [name], female    DOB: 1955/11/24, 62 y.o.   MRN: [id number]  HPI Here today for 6 months follow up. Last seen in July of last year for f/u of diverticulitis.  Seen in the ED 09/11/2016 with left lower abdominal pain. CT scan revealed IMPRESSION: 1. Acute diverticulitis, with focal wall thickening and soft tissue inflammation at the proximal sigmoid colon. No evidence of perforation or abscess formation at this time. She was discharged on oral Cipro and Flagyl.  She presents today with c/o diarrhea. Diarrhea started about 3 weeks ago. She says sometimes the diarrhea is uncontrollable. She started taking Imodium and this has helped.  She says her stools are not solid but not watery now. She says her stools are not quite normal. No recent antibiotics. Stools have some form. No fever associated with her symptoms.  Patient is leaving for [place] in the morning.    04/10/2011 Colonoscopy:  PROCEDURE: Colonoscopy.  INDICATION: [name] is 62 year old Caucasian female with several week  history of diarrhea and negative stool studies. Her last colonoscopy  was over 3 years ago with removal of a small tubular adenoma. She is  undergoing diagnostic colonoscopy. Procedures were reviewed with the  patient. Informed consent was obtained.   FINAL DIAGNOSES:  1. Normal terminal ileum.  2. Mild left-sided diverticulosis.  3. Normal colonic mucosa. Biopsies taken from sigmoid colon looking  for microscopic and/or collagenous colitis.  Biopsy: lymphocytic colitis.    Review of Systems Past Medical History:  Diagnosis Date  . Anxiety   . Bipolar 1 disorder ([place])   . Depression   . Diverticulitis   . Diverticulitis of colon 03/15/2016    Past Surgical History:  Procedure Laterality Date  . COLONOSCOPY    . oophrorectomy    . TUBAL LIGATION      No Known Allergies  Current Outpatient Medications on File Prior to Visit  Medication Sig Dispense  Refill  . ALPRAZolam (XANAX) 0.5 MG tablet Take 1 mg by mouth at bedtime as needed for anxiety.     Marland Kitchen buPROPion (WELLBUTRIN XL) 150 MG 24 hr tablet Take 450 mg by mouth daily.     Marland Kitchen lamoTRIgine (LAMICTAL) 100 MG tablet Take by mouth 2 (two) times daily.     . traZODone (DESYREL) 100 MG tablet Take 100 mg by mouth at bedtime.     No current facility-administered medications on file prior to visit.         Objective:   Physical Exam Blood pressure 140/80, pulse 60, temperature 98.6 F (37 C), height 5\' 7"  (1.702 m), weight 173 lb 12.8 oz (78.8 kg). Alert and oriented. Skin warm and dry. Oral mucosa is moist.   . Sclera anicteric, conjunctivae is pink. Thyroid not enlarged. No cervical lymphadenopathy. Lungs clear. Heart regular rate and rhythm.  Abdomen is soft. Bowel sounds are positive. No hepatomegaly. No abdominal masses felt. No tenderness.  No edema to lower extremities.           Assessment & Plan:  Diarrhea. No recent antibiotics. Stools are becoming more firmer now. :? Viral gastroenteritis. Am going to cover her with Flagyl 500mg  bid x 10 days. She may start this Sunday. She cannot take this and drink cocktails.

## 2017-03-16 ENCOUNTER — Ambulatory Visit (INDEPENDENT_AMBULATORY_CARE_PROVIDER_SITE_OTHER): Payer: BLUE CROSS/BLUE SHIELD | Admitting: Internal Medicine

## 2017-05-03 ENCOUNTER — Encounter (INDEPENDENT_AMBULATORY_CARE_PROVIDER_SITE_OTHER): Payer: Self-pay | Admitting: *Deleted

## 2017-05-10 ENCOUNTER — Telehealth (INDEPENDENT_AMBULATORY_CARE_PROVIDER_SITE_OTHER): Payer: Self-pay | Admitting: *Deleted

## 2017-05-10 NOTE — Telephone Encounter (Signed)
Patient called left message stating Terri called her ina  Medication and she went out of the country and has came back and started taking it, patient states this medication is making her so sick. Please advise [phone]

## 2017-05-11 ENCOUNTER — Telehealth (INDEPENDENT_AMBULATORY_CARE_PROVIDER_SITE_OTHER): Payer: Self-pay | Admitting: Internal Medicine

## 2017-05-11 MED ORDER — ONDANSETRON HCL 4 MG PO TABS: 4.0000 mg | ORAL_TABLET | Freq: Three times a day (TID) | ORAL | 0 refills | Status: DC | PRN

## 2017-05-11 NOTE — Telephone Encounter (Signed)
Patient called requesting something for nausea, patient states she is having a lot of nausea. Pharmacy [facility] in [place].

## 2017-05-11 NOTE — Telephone Encounter (Signed)
Rx for Flagyl called to her pharmacy

## 2017-05-12 NOTE — Telephone Encounter (Signed)
err

## 2017-05-16 ENCOUNTER — Encounter: Payer: Self-pay | Admitting: Family Medicine

## 2018-01-01 ENCOUNTER — Encounter: Payer: Self-pay | Admitting: Emergency Medicine

## 2018-01-01 DIAGNOSIS — F3175 Bipolar disorder, in partial remission, most recent episode depressed: Secondary | ICD-10-CM

## 2018-01-01 DIAGNOSIS — G4700 Insomnia, unspecified: Secondary | ICD-10-CM | POA: Insufficient documentation

## 2018-01-01 DIAGNOSIS — F419 Anxiety disorder, unspecified: Secondary | ICD-10-CM | POA: Insufficient documentation

## 2018-01-01 DIAGNOSIS — F411 Generalized anxiety disorder: Secondary | ICD-10-CM

## 2018-01-01 HISTORY — DX: Generalized anxiety disorder: F41.1

## 2018-01-01 HISTORY — DX: Insomnia, unspecified: G47.00

## 2018-01-12 ENCOUNTER — Ambulatory Visit (INDEPENDENT_AMBULATORY_CARE_PROVIDER_SITE_OTHER): Payer: BLUE CROSS/BLUE SHIELD | Admitting: Physician Assistant

## 2018-01-12 ENCOUNTER — Encounter: Payer: Self-pay | Admitting: Physician Assistant

## 2018-01-12 DIAGNOSIS — F411 Generalized anxiety disorder: Secondary | ICD-10-CM

## 2018-01-12 DIAGNOSIS — F319 Bipolar disorder, unspecified: Secondary | ICD-10-CM

## 2018-01-12 MED ORDER — TRAZODONE HCL 100 MG PO TABS: 100.0000 mg | ORAL_TABLET | Freq: Every day | ORAL | 1 refills | Status: DC

## 2018-01-12 MED ORDER — ALPRAZOLAM 0.5 MG PO TABS: ORAL_TABLET | ORAL | 1 refills | Status: DC

## 2018-01-12 MED ORDER — BUSPIRONE HCL 15 MG PO TABS: ORAL_TABLET | ORAL | 1 refills | Status: DC

## 2018-01-12 MED ORDER — BUPROPION HCL ER (XL) 150 MG PO TB24: 450.0000 mg | ORAL_TABLET | Freq: Every day | ORAL | 1 refills | Status: DC

## 2018-01-12 MED ORDER — LAMOTRIGINE 100 MG PO TABS: 150.0000 mg | ORAL_TABLET | Freq: Two times a day (BID) | ORAL | 1 refills | Status: DC

## 2018-01-12 NOTE — Progress Notes (Signed)
[name] [id number] Dec 04, 1955 62 y.o.  Subjective:   Patient ID:  [name] is a 62 y.o. (DOB 29-Dec-1955) female.  Chief Complaint:  Chief Complaint  Patient presents with  . Follow-up   HPI [name] presents to the office today for follow-up of bipolar d/o, anxiety, and insomnia. States she's doing pretty well.  But is worried about her memory.  For a couple of years, has noticed that she forgets things more, like she'll forget things at home and have to go back and get something.  And can't remember things from years ago.    Still is very anxious.  Has been on Xanax 9 years.  Is trying to cut back.  Her son is worried that it's making her more forgetful.  "I know I am addicted and I would love to get off but I do not know how.  There are few times where I have taken the 1 mg in the morning and only a half at night but I can really tell the difference and I am more anxious and feel terrible.  Denies anhedonia, decreased energy but does have decreased motivation.  She would like to stay home and be in her pajamas all day but she cannot.  She does isolate as much as possible.  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  She denies suicidal or homicidal thoughts.  No hallucinations.  She sleeps well as long as she has the trazodone.  She quit smoking 3-1/2 weeks ago.  She is using the NicoDerm patch but it is helpful.   Review of Systems:  Review of Systems  Psychiatric/Behavioral: Positive for decreased concentration and sleep disturbance. Negative for dysphoric mood, hallucinations, self-injury and suicidal ideas. The patient is nervous/anxious. The patient is not hyperactive.     Medications: I have reviewed the patient's current medications.  Current Outpatient Medications  Medication Sig Dispense Refill  . buPROPion (WELLBUTRIN XL) 150 MG 24 hr tablet Take 3 tablets (450 mg total) by mouth daily. 270 tablet 1  . nicotine (NICODERM CQ -  DOSED IN MG/24 HOURS) 14 mg/24hr patch Place 14 mg onto the skin daily.    . ondansetron (ZOFRAN) 4 MG tablet Take 1 tablet (4 mg total) by mouth every 8 (eight) hours as needed for nausea or vomiting. 30 tablet 0  . traZODone (DESYREL) 100 MG tablet Take 1 tablet (100 mg total) by mouth at bedtime. 90 tablet 1  . ALPRAZolam (XANAX) 0.5 MG tablet 2 po q am, 1.5 qhs 105 tablet 1  . busPIRone (BUSPAR) 15 MG tablet 1/3 po bid x 5 days, then 2/3 bid x 5 days, then 1 po bid. 60 tablet 1  . lamoTRIgine (LAMICTAL) 100 MG tablet Take 1.5 tablets (150 mg total) by mouth 2 (two) times daily. 270 tablet 1  . metroNIDAZOLE (FLAGYL) 500 MG tablet Take 1 tablet (500 mg total) by mouth 2 (two) times daily. (Patient not taking: Reported on 01/12/2018) 20 tablet 0   No current facility-administered medications for this visit.     Medication Side Effects: None  Allergies: No Known Allergies  Past Medical History:  Diagnosis Date  . Anxiety   . Bipolar 1 disorder ([place])   . Depression   . Diverticulitis   . Diverticulitis of colon 03/15/2016    Family History  Problem Relation Age of Onset  . Cancer Mother        lichen planus - oral cancer  . Cancer  Father        lung  . Cancer Maternal Uncle        uterine  . Diabetes Maternal Uncle   . Stroke Maternal Grandmother   . COPD Paternal Grandfather     Social History   Socioeconomic History  . Marital status: Divorced    Spouse name: Not on file  . Number of children: 2  . Years of education: 48  . Highest education level: Not on file  Occupational History  . Occupation: Psychologist, 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[employer], [place]  . Financial resource strain: Not on file  . Food insecurity:    Worry: Not on file    Inability: Not on file  . Transportation needs:    Medical: Not on file    Non-medical: Not on file  Tobacco Use  . Smoking status: Former Smoker    Packs/day: 0.50    Types: Cigarettes    Last attempt to  quit: 12/19/2017    Years since quitting: 0.0  . Smokeless tobacco: Never Used  . Tobacco comment: none in 9 months. after smoking 1 pack a day x 20 yrs.  Substance and Sexual Activity  . Alcohol use: Yes    Alcohol/week: 4.0 standard drinks    Types: 4 Glasses of wine per week    Comment: per week  . Drug use: No  . Sexual activity: Not Currently    Birth control/protection: Post-menopausal  Lifestyle  . Physical activity:    Days per week: Not on file    Minutes per session: Not on file  . Stress: Not on file  Relationships  . Social connections:    Talks on phone: Not on file    Gets together: Not on file    Attends religious service: Not on file    Active member of club or organization: Not on file    Attends meetings of clubs or organizations: Not on file    Relationship status: Not on file  . Intimate partner violence:    Fear of current or ex partner: Not on file    Emotionally abused: Not on file    Physically abused: Not on file    Forced sexual activity: Not on file  Other Topics Concern  . Not on file  Social History Narrative   Lives alone   Stays active at work, walks and stairs, also walks a mile a day    Past Medical History, Surgical history, Social history, and Family history were reviewed and updated as appropriate.   Please see review of systems for further details on the patient's review from today.   Objective:   Physical Exam:  There were no vitals taken for this visit.  Physical Exam  Constitutional: She is oriented to person, place, and time. She appears well-developed.  Neurological: She is alert and oriented to person, place, and time. Coordination normal.  Psychiatric: Her speech is normal and behavior is normal. Judgment and thought content normal. Her mood appears anxious. Her affect is not angry, not blunt, not labile and not inappropriate. Cognition and memory are not impaired. She does not exhibit a depressed mood. She exhibits abnormal  recent memory and abnormal remote memory. She is attentive.    Lab Review:     Component Value Date/Time   NA 141 09/11/2016 0403   K 4.1 09/11/2016 0403   CL 105 09/11/2016 0403   CO2 26 09/11/2016 0403   GLUCOSE 122 (H) 09/11/2016  0403   BUN 11 09/11/2016 0403   CREATININE 0.64 09/11/2016 0403   CREATININE 0.94 04/08/2016 1226   CALCIUM 9.5 09/11/2016 0403   PROT 6.8 09/11/2016 0403   ALBUMIN 4.1 09/11/2016 0403   AST 17 09/11/2016 0403   ALT 17 09/11/2016 0403   ALKPHOS 93 09/11/2016 0403   BILITOT 0.7 09/11/2016 0403   GFRNONAA >60 09/11/2016 0403   GFRAA >60 09/11/2016 0403       Component Value Date/Time   WBC 6.9 09/11/2016 0403   RBC 4.30 09/11/2016 0403   HGB 14.4 09/11/2016 0403   HCT 43.4 09/11/2016 0403   PLT 204 09/11/2016 0403   MCV 100.9 (H) 09/11/2016 0403   MCH 33.5 09/11/2016 0403   MCHC 33.2 09/11/2016 0403   RDW 12.9 09/11/2016 0403   LYMPHSABS 1,012 04/08/2016 1226   MONOABS 748 04/08/2016 1226   EOSABS 44 04/08/2016 1226   BASOSABS 44 04/08/2016 1226    No results found for: POCLITH, LITHIUM   No results found for: PHENYTOIN, PHENOBARB, VALPROATE, CBMZ   .res Assessment: Plan:    Generalized anxiety disorder  Bipolar I disorder ([place])  Please see After Visit Summary for patient specific instructions.  Future Appointments  Date Time Provider [place]  01/20/2018 10:30 AM Rosary Lively, LPC CP-[facility] None  02/24/2018 10:30 AM Adelene Idler, Helene Kelp T, PA-C [facility] None    No orders of the defined types were placed in this encounter.  Plan  I spent 25 minutes with the patient and at least 50% of that time was spent in counseling concerning diagnosis and treatment options. Discussed with her that Xanax or any of the benzodiazepines can possibly lead to dementia, depending on the dose in the length of time is used.  This drug could be playing a big part in the reason she seems to be more forgetful. Will start to wean down on the Xanax by  giving 0.5 mg.  She will take 2 every morning and one-point 5 in the evening.  She knows not to take anymore per day and I will not give early refills.  Our plan will be to decrease by 0.25 mg daily per month therefore it may take 4 to 6 months for her to completely get off of this. Start BuSpar 15 mg one third twice daily for 5 days, two thirds twice daily for 5 days and then 1 p.o. twice daily Continue trazodone, Lamictal, Wellbutrin as directed. But the good work as far as smoking cessation goes. Consider getting back into therapy with Rosary Lively, LPC. Return in 4 to 6 weeks.  Donnal Moat, PA-C     -------------------------------

## 2018-01-20 ENCOUNTER — Ambulatory Visit (INDEPENDENT_AMBULATORY_CARE_PROVIDER_SITE_OTHER): Payer: BLUE CROSS/BLUE SHIELD | Admitting: Mental Health

## 2018-01-20 DIAGNOSIS — F3175 Bipolar disorder, in partial remission, most recent episode depressed: Secondary | ICD-10-CM

## 2018-01-20 NOTE — Progress Notes (Signed)
      [facility] Counselor/Therapist Progress Note   Patient ID: [name], MRN: [id number]  Date: 01/20/2018  Timespent: 45 minutes  Treatment Type: Individual   Reported Symptoms: Panic attacks, Fatigue and anxiety, stress   Mental Status Exam:    Appearance:   Well Groomed     Behavior:  Appropriate  Motor:  Normal  Speech/Language:   Pressured  Affect:  edgy, stressed  Mood:  anxious  Thought process:  normal  Thought content:    WNL  Sensory/Perceptual disturbances:    WNL  Orientation:  oriented to person, place and time/date  Attention:  Good  Concentration:  Fair  Memory:  poor  Fund of knowledge:   Good  Insight:    Good  Judgment:   Good  Impulse Control:  Good     Risk Assessment: Danger to Self:  No Self-injurious Behavior: No Danger to Others: No Duty to Warn:no Physical Aggression / Violence:No  Access to Firearms a concern: No  Gang Involvement:No    Subjective: Has panic attacks, anxiety. Stressors: Fsmily interactions. Loss of patients with grandchildren. Worried about son's heart condition.   Interventions: Cognitive Behavioral Therapy, Solution-Oriented/Positive Psychology, Grief Therapy, Insight-Oriented and Interpersonal   Diagnosis:   ICD-10-CM   1. Depressed bipolar I disorder in partial remission (H[facility]) F31.75      Plan:  Self-care program            Grief recovery            Decrease anxiety            Validation and support   Rosary Lively, [facility]

## 2018-02-15 ENCOUNTER — Ambulatory Visit: Payer: BLUE CROSS/BLUE SHIELD | Admitting: Mental Health

## 2018-02-23 ENCOUNTER — Ambulatory Visit (INDEPENDENT_AMBULATORY_CARE_PROVIDER_SITE_OTHER): Payer: BLUE CROSS/BLUE SHIELD | Admitting: Physician Assistant

## 2018-02-23 ENCOUNTER — Encounter: Payer: Self-pay | Admitting: Physician Assistant

## 2018-02-23 DIAGNOSIS — F319 Bipolar disorder, unspecified: Secondary | ICD-10-CM | POA: Diagnosis not present

## 2018-02-23 DIAGNOSIS — F411 Generalized anxiety disorder: Secondary | ICD-10-CM

## 2018-02-23 NOTE — Progress Notes (Signed)
[facility] Med Check  Patient ID: [name],  MRN: [id number]  PCP: Patient, No Pcp Per  Date of Evaluation: 02/23/2018 Time spent:15 minutes  Chief Complaint:  Chief Complaint    Follow-up      HISTORY/CURRENT STATUS: HPI For f/u after starting to wean off Xanax.  She's done fine with going down. She's still forgetful. "I know that's going to take awhile.  After I saw you the last time, I went home and I couldn't even remember how to make a bologna sandwich."  But it hasn't been that bad at any other time.  She usually has more anxiety and depression in the wintertime.  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  She has been very busy with that and it makes her feel good.  States that she has been feeling "up" but is not spending more money.  She actually cut up her credit cards and has not gone in debt for Christmas.  She is sleeping normally.  Depression is much better.  She denies increased energy with decreased need for sleep, no impulsivity or risky behavior.  Individual Medical History/ Review of Systems: Changes? :No   Allergies: Patient has no known allergies.  Current Medications:  Current Outpatient Medications:  .  ALPRAZolam (XANAX) 0.5 MG tablet, 2 po q am, 1.5 qhs, Disp: 105 tablet, Rfl: 1 .  buPROPion (WELLBUTRIN XL) 150 MG 24 hr tablet, Take 3 tablets (450 mg total) by mouth daily., Disp: 270 tablet, Rfl: 1 .  busPIRone (BUSPAR) 15 MG tablet, 1/3 po bid x 5 days, then 2/3 bid x 5 days, then 1 po bid., Disp: 60 tablet, Rfl: 1 .  lamoTRIgine (LAMICTAL) 100 MG tablet, Take 1.5 tablets (150 mg total) by mouth 2 (two) times daily., Disp: 270 tablet, Rfl: 1 .  ondansetron (ZOFRAN) 4 MG tablet, Take 1 tablet (4 mg total) by mouth every 8 (eight) hours as needed for nausea or vomiting., Disp: 30 tablet, Rfl: 0 .  traZODone (DESYREL) 100 MG tablet, Take 1 tablet (100 mg total) by mouth at bedtime., Disp: 90 tablet, Rfl:  1 Medication Side Effects: none  Family Medical/ Social History: Changes? No  MENTAL HEALTH EXAM:  There were no vitals taken for this visit.There is no height or weight on file to calculate BMI.  General Appearance: Casual and Well Groomed  Eye Contact:  Good  Speech:  Clear and Coherent  Volume:  Normal  Mood:  Euthymic  Affect:  Appropriate  Thought Process:  Goal Directed  Orientation:  Full (Time, Place, and Person)  Thought Content: Logical   Suicidal Thoughts:  No  Homicidal Thoughts:  No  Memory:  Immediate is fair  Judgement:  Good  Insight:  Good  Psychomotor Activity:  Normal  Concentration:  Concentration: Good and Attention Span: Good  Recall:  Good  Fund of Knowledge: Good  Language: Good  Assets:  Desire for Improvement  ADL's:  Intact  Cognition: WNL  Prognosis:  Good    DIAGNOSES:    ICD-10-CM   1. Generalized anxiety disorder F41.1   2. Bipolar I disorder ([place]) F31.9     Receiving Psychotherapy: No    RECOMMENDATIONS: Since she is doing well on her current regimen, and we are going into the most difficult time of year for her as far as anxiety and depression go, I recommend that she stay on dose of Xanax and we will continue to wean down early spring if  she tolerates it. Continue all other meds above. She will let me know if she has more manic symptoms.  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  I see no reason to change any of the mood stabilizing medications. Return in 6 weeks.   Donnal Moat, PA-C

## 2018-02-24 ENCOUNTER — Ambulatory Visit: Payer: BLUE CROSS/BLUE SHIELD | Admitting: Physician Assistant

## 2018-03-21 ENCOUNTER — Encounter

## 2018-03-21 ENCOUNTER — Ambulatory Visit: Payer: BLUE CROSS/BLUE SHIELD | Admitting: Physician Assistant

## 2018-03-31 ENCOUNTER — Other Ambulatory Visit: Payer: Self-pay | Admitting: Physician Assistant

## 2018-04-07 ENCOUNTER — Encounter: Payer: Self-pay | Admitting: Physician Assistant

## 2018-04-07 ENCOUNTER — Ambulatory Visit (INDEPENDENT_AMBULATORY_CARE_PROVIDER_SITE_OTHER): Payer: BLUE CROSS/BLUE SHIELD | Admitting: Physician Assistant

## 2018-04-07 DIAGNOSIS — G4700 Insomnia, unspecified: Secondary | ICD-10-CM

## 2018-04-07 DIAGNOSIS — F319 Bipolar disorder, unspecified: Secondary | ICD-10-CM | POA: Diagnosis not present

## 2018-04-07 DIAGNOSIS — F411 Generalized anxiety disorder: Secondary | ICD-10-CM | POA: Diagnosis not present

## 2018-04-07 MED ORDER — ALPRAZOLAM 0.5 MG PO TABS: ORAL_TABLET | ORAL | 0 refills | Status: DC

## 2018-04-07 MED ORDER — BUSPIRONE HCL 15 MG PO TABS: ORAL_TABLET | ORAL | 0 refills | Status: DC

## 2018-04-07 NOTE — Patient Instructions (Signed)
Xanax 0.5 mg, take 1.75 pills every morning and 1.5 pills for 2 weeks.  Then take: 1.5 pills twice a day for 2 weeks, Then 1.25 pills twice a day until we see each other.

## 2018-04-07 NOTE — Progress Notes (Signed)
[facility] Med Check  Patient ID: [name],  MRN: [id number]  PCP: Patient, No Pcp Per  Date of Evaluation: 04/07/2018 Time spent:15 minutes  Chief Complaint:   HISTORY/CURRENT STATUS: HPI Here for routine med check.  Decreasing the Xanax has been going okay.  Is now at 1 mg in a.m. and .'75mg'$  in the evening. Has been tolerating that well. Her memory is much better.  Anxiety is controlled.  Buspar helps.  States she is excited to be weaning down on the Xanax.  She did try to go down to 0.75 mg in the morning but had some dizziness and other strange feeling so she went back to 1 mg total.  States things are going really well.  She went to [facility] recently.  She met a guy that she is still talking with.  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  States she sleeps good.  Patient denies loss of interest in usual activities and is able to enjoy things.  Denies decreased energy or motivation.  Appetite has not changed.  No extreme sadness, tearfulness, or feelings of hopelessness.  Denies any changes in concentration, making decisions or remembering things.  Denies suicidal or homicidal thoughts.  Individual Medical History/ Review of Systems: Changes? :No   Past medications for mental health diagnoses include: Effexor, Ativan, Paxil, Lithobid, Xanax, Lamictal, trazodone, Saphris, Zoloft, Seroquel, BuSpar,  Allergies: Patient has no known allergies.  Current Medications:  Current Outpatient Medications:  .  ALPRAZolam (XANAX) 0.5 MG tablet, 1.75 pills in a.m. and 1.5 pills in pm for 2 weeks, then 1.5 pills bid for 2 wks, then 1.25 pills bid until next visit, Disp: 105 tablet, Rfl: 0 .  buPROPion (WELLBUTRIN XL) 150 MG 24 hr tablet, Take 3 tablets (450 mg total) by mouth daily., Disp: 270 tablet, Rfl: 1 .  busPIRone (BUSPAR) 15 MG tablet, 1 TABLET TWICE DAILY, Disp: 180 tablet, Rfl: 0 .  lamoTRIgine (LAMICTAL) 100 MG tablet, Take 1.5 tablets (150 mg total)  by mouth 2 (two) times daily., Disp: 270 tablet, Rfl: 1 .  ondansetron (ZOFRAN) 4 MG tablet, Take 1 tablet (4 mg total) by mouth every 8 (eight) hours as needed for nausea or vomiting., Disp: 30 tablet, Rfl: 0 .  traZODone (DESYREL) 100 MG tablet, Take 1 tablet (100 mg total) by mouth at bedtime., Disp: 90 tablet, Rfl: 1 Medication Side Effects: none  Family Medical/ Social History: Changes? No  MENTAL HEALTH EXAM:  There were no vitals taken for this visit.There is no height or weight on file to calculate BMI.  General Appearance: Casual and Well Groomed  Eye Contact:  Good  Speech:  Clear and Coherent  Volume:  Normal  Mood:  Euthymic  Affect:  Appropriate  Thought Process:  Goal Directed  Orientation:  Full (Time, Place, and Person)  Thought Content: Logical   Suicidal Thoughts:  No  Homicidal Thoughts:  No  Memory:  WNL  Judgement:  Good  Insight:  Good  Psychomotor Activity:  Normal  Concentration:  Concentration: Good and Attention Span: Good  Recall:  Good  Fund of Knowledge: Good  Language: Good  Assets:  Desire for Improvement  ADL's:  Intact  Cognition: WNL  Prognosis:  Good    DIAGNOSES:    ICD-10-CM   1. Bipolar I disorder ([place]) F31.9   2. Generalized anxiety disorder F41.1   3. Insomnia, unspecified type G47.00     Receiving Psychotherapy: No  RECOMMENDATIONS: Continue Wellbutrin XL 450 mg daily. Continue BuSpar 15 mg 1 twice daily. Continue Lamictal 150 mg twice daily. Continue trazodone 100 mg nightly as needed. Continue to wean Xanax 0.5 mg, take 1.75 pills every morning and 1.5 pills for 2 weeks.  Then take: 1.5 pills twice a day for 2 weeks, Then 1.25 pills twice a day until we see each other.  Return in 6 weeks or sooner as needed.   Donnal Moat, PA-C

## 2018-04-27 ENCOUNTER — Telehealth (INDEPENDENT_AMBULATORY_CARE_PROVIDER_SITE_OTHER): Payer: Self-pay | Admitting: Internal Medicine

## 2018-04-27 ENCOUNTER — Other Ambulatory Visit (INDEPENDENT_AMBULATORY_CARE_PROVIDER_SITE_OTHER): Payer: Self-pay | Admitting: Internal Medicine

## 2018-04-27 DIAGNOSIS — K5732 Diverticulitis of large intestine without perforation or abscess without bleeding: Secondary | ICD-10-CM

## 2018-04-27 MED ORDER — METRONIDAZOLE 500 MG PO TABS: 500.0000 mg | ORAL_TABLET | Freq: Two times a day (BID) | ORAL | 0 refills | Status: AC

## 2018-04-27 MED ORDER — CIPROFLOXACIN HCL 500 MG PO TABS: 500.0000 mg | ORAL_TABLET | Freq: Two times a day (BID) | ORAL | 0 refills | Status: AC

## 2018-04-27 NOTE — Progress Notes (Signed)
Patient feels like she is having a diverticular flare. Rx for Flagyl and Cipro e prescribed to he pharmacy

## 2018-04-27 NOTE — Telephone Encounter (Signed)
Rx for Cipro and Flagyl x 14 days sent to her pharmacy

## 2018-04-27 NOTE — Telephone Encounter (Signed)
Patient called stated she is having a diverticulitis flare up - has been taking some medication she has taken before but she doesn't have enough - please call her at [phone]

## 2018-04-30 ENCOUNTER — Other Ambulatory Visit: Payer: Self-pay

## 2018-04-30 ENCOUNTER — Emergency Department (HOSPITAL_COMMUNITY)
Admission: EM | Admit: 2018-04-30 | Discharge: 2018-05-01 | Disposition: A | Discharge status: Home / Self Care | Payer: BLUE CROSS/BLUE SHIELD | Attending: Emergency Medicine | Admitting: Emergency Medicine

## 2018-04-30 ENCOUNTER — Encounter (HOSPITAL_COMMUNITY): Payer: Self-pay | Admitting: Emergency Medicine

## 2018-04-30 DIAGNOSIS — F419 Anxiety disorder, unspecified: Secondary | ICD-10-CM | POA: Diagnosis not present

## 2018-04-30 DIAGNOSIS — R1030 Lower abdominal pain, unspecified: Secondary | ICD-10-CM

## 2018-04-30 DIAGNOSIS — Z87891 Personal history of nicotine dependence: Secondary | ICD-10-CM | POA: Diagnosis not present

## 2018-04-30 DIAGNOSIS — F319 Bipolar disorder, unspecified: Secondary | ICD-10-CM | POA: Diagnosis not present

## 2018-04-30 DIAGNOSIS — Z79899 Other long term (current) drug therapy: Secondary | ICD-10-CM | POA: Insufficient documentation

## 2018-04-30 DIAGNOSIS — R109 Unspecified abdominal pain: Secondary | ICD-10-CM | POA: Diagnosis present

## 2018-04-30 MED ORDER — SODIUM CHLORIDE 0.9 % IV BOLUS
1000.0000 mL | Freq: Once | INTRAVENOUS | Status: AC
  Administered 2018-04-30: 1000 mL via INTRAVENOUS

## 2018-04-30 MED ORDER — FENTANYL CITRATE (PF) 100 MCG/2ML IJ SOLN
50.0000 ug | Freq: Once | INTRAMUSCULAR | Status: AC
  Administered 2018-05-01: 50 ug via INTRAVENOUS
  Filled 2018-04-30: qty 2

## 2018-04-30 MED ORDER — ONDANSETRON HCL 4 MG/2ML IJ SOLN
4.0000 mg | Freq: Once | INTRAMUSCULAR | Status: AC
  Administered 2018-05-01: 4 mg via INTRAVENOUS
  Filled 2018-04-30: qty 2

## 2018-04-30 NOTE — ED Triage Notes (Signed)
Pt c/o center [facility] pain x 1 week, pt reports hx of diverticulitis, called Dr. Gabriel Carina office Wednesday for same and was prescribed Metronidazole and Cipro with no relief, pt reports increased pain

## 2018-05-01 ENCOUNTER — Emergency Department (HOSPITAL_COMMUNITY): Payer: BLUE CROSS/BLUE SHIELD

## 2018-05-01 LAB — URINALYSIS, ROUTINE W REFLEX MICROSCOPIC
Bilirubin Urine: NEGATIVE
Glucose, UA: NEGATIVE mg/dL
Hgb urine dipstick: NEGATIVE
Ketones, ur: NEGATIVE mg/dL
Leukocytes,Ua: NEGATIVE
Nitrite: NEGATIVE
Protein, ur: NEGATIVE mg/dL
Specific Gravity, Urine: >1.046 — ABNORMAL HIGH (ref 1.005–1.030)
pH: 6 (ref 5.0–8.0)

## 2018-05-01 LAB — COMPREHENSIVE METABOLIC PANEL
ALT: 19 U/L (ref 0–44)
AST: 24 U/L (ref 15–41)
Albumin: 3.7 g/dL (ref 3.5–5.0)
Alkaline Phosphatase: 81 U/L (ref 38–126)
Anion gap: 6 (ref 5–15)
BUN: 8 mg/dL (ref 8–23)
CO2: 25 mmol/L (ref 22–32)
Calcium: 9.1 mg/dL (ref 8.9–10.3)
Chloride: 107 mmol/L (ref 98–111)
Creatinine, Ser: 0.66 mg/dL (ref 0.44–1.00)
GFR calc Af Amer: >60 mL/min (ref >60)
GFR calc non Af Amer: >60 mL/min (ref >60)
Glucose, Bld: 100 mg/dL — ABNORMAL HIGH (ref 70–99)
Potassium: 3.4 mmol/L — ABNORMAL LOW (ref 3.5–5.1)
Sodium: 138 mmol/L (ref 135–145)
Total Bilirubin: 0.4 mg/dL (ref 0.3–1.2)
Total Protein: 5.9 g/dL — ABNORMAL LOW (ref 6.5–8.1)

## 2018-05-01 LAB — CBC WITH DIFFERENTIAL/PLATELET
Abs Immature Granulocytes: 0.01 10*3/uL (ref 0.00–0.07)
Basophils Absolute: 0 10*3/uL (ref 0.0–0.1)
Basophils Relative: 0 %
Eosinophils Absolute: 0.1 10*3/uL (ref 0.0–0.5)
Eosinophils Relative: 1 %
HCT: 41.7 % (ref 36.0–46.0)
Hemoglobin: 13.5 g/dL (ref 12.0–15.0)
Immature Granulocytes: 0 %
Lymphocytes Relative: 24 %
Lymphs Abs: 1.2 10*3/uL (ref 0.7–4.0)
MCH: 33.9 pg (ref 26.0–34.0)
MCHC: 32.4 g/dL (ref 30.0–36.0)
MCV: 104.8 fL — ABNORMAL HIGH (ref 80.0–100.0)
Monocytes Absolute: 0.5 10*3/uL (ref 0.1–1.0)
Monocytes Relative: 11 %
Neutro Abs: 3.2 10*3/uL (ref 1.7–7.7)
Neutrophils Relative %: 64 %
Platelets: 206 10*3/uL (ref 150–400)
RBC: 3.98 MIL/uL (ref 3.87–5.11)
RDW: 12.1 % (ref 11.5–15.5)
WBC: 5 10*3/uL (ref 4.0–10.5)
nRBC: 0 % (ref 0.0–0.2)

## 2018-05-01 LAB — LIPASE, BLOOD: Lipase: 32 U/L (ref 11–51)

## 2018-05-01 MED ORDER — DICYCLOMINE HCL 10 MG/ML IM SOLN
20.0000 mg | Freq: Once | INTRAMUSCULAR | Status: AC
  Administered 2018-05-01: 20 mg via INTRAMUSCULAR
  Filled 2018-05-01: qty 2

## 2018-05-01 MED ORDER — DICYCLOMINE HCL 20 MG PO TABS: 20.0000 mg | ORAL_TABLET | Freq: Three times a day (TID) | ORAL | 0 refills | Status: DC

## 2018-05-01 MED ORDER — ONDANSETRON HCL 4 MG PO TABS: 4.0000 mg | ORAL_TABLET | Freq: Three times a day (TID) | ORAL | 0 refills | Status: DC | PRN

## 2018-05-01 MED ORDER — IOHEXOL 300 MG/ML  SOLN
100.0000 mL | Freq: Once | INTRAMUSCULAR | Status: AC | PRN
  Administered 2018-05-01: 100 mL via INTRAVENOUS

## 2018-05-01 MED ORDER — SODIUM CHLORIDE 0.9 % IV BOLUS
1000.0000 mL | Freq: Once | INTRAVENOUS | Status: AC
  Administered 2018-05-01: 1000 mL via INTRAVENOUS

## 2018-05-01 NOTE — Discharge Instructions (Addendum)
Try the Bentyl for your pain.  Please follow-up with Dr. Barrie Dunker if you continue to have discomfort.

## 2018-05-01 NOTE — ED Provider Notes (Signed)
[facility] EMERGENCY DEPARTMENT Provider Note   CSN: [id number] Arrival date & time: 04/30/18  2201  Time seen 11:45 PM  History   Chief Complaint Chief Complaint  Patient presents with  . Abdominal Pain    HPI [name] is a 63 y.o. female.     HPI patient states she has had a history of diverticulitis for many years and was getting it twice a year.  She states she had a UTI that had a strep infection and she was treated with penicillin for 3 days around the 14th.  She states she started having some aching and nagging abdominal pain that started after she finished those antibiotics which got worse on the 19th.  She states she feels tired and sleepy.  She complains of some left lower abdominal pain and also some midline lower abdominal cramping.  She has had nausea without vomiting.  She states she is having her usual loose stools without bleeding.  She denies known fever but has chills.  She states that the day the pain got worse she cleaned the house and she noticed when she vacuumed her swept it made the pain hurt more.  She states the pain is there constantly and waxes and wanes.  She states nothing makes it feel better.  She also complains of a lot of bloating and states she feels like her abdomen is going to explode.  She denies dysuria or frequency, she does have loss of appetite.  She has noted she starting to have some pain on her right side.  She still has her appendix.  PCP [employer], [employer]    Past Medical History:  Diagnosis Date  . Anxiety   . Bipolar 1 disorder ([place])   . Depression   . Diverticulitis   . Diverticulitis of colon 03/15/2016    Patient Active Problem List   Diagnosis Date Noted  . Depressed bipolar I disorder in partial remission ([place]) 01/01/2018  . Insomnia 01/01/2018  . Anxiety state 01/01/2018  . Tobacco abuse 06/25/2016  . Diverticulitis of colon 03/15/2016  . Bipolar disorder ([place]) 12/12/2013    Past  Surgical History:  Procedure Laterality Date  . COLONOSCOPY    . oophrorectomy    . TUBAL LIGATION       OB History    Gravida      Para      Term      Preterm      AB      Living  2     SAB      TAB      Ectopic      Multiple      Live Births               Home Medications    Prior to Admission medications   Medication Sig Start Date End Date Taking? Authorizing Provider  ALPRAZolam Duanne Moron) 0.5 MG tablet 1.75 pills in a.m. and 1.5 pills in pm for 2 weeks, then 1.5 pills bid for 2 wks, then 1.25 pills bid until next visit 04/07/18   Donnal Moat T, PA-C  buPROPion (WELLBUTRIN XL) 150 MG 24 hr tablet Take 3 tablets (450 mg total) by mouth daily. 01/12/18   Donnal Moat T, PA-C  busPIRone (BUSPAR) 15 MG tablet 1 TABLET TWICE DAILY 04/07/18   Donnal Moat T, PA-C  ciprofloxacin (CIPRO) 500 MG tablet Take 1 tablet (500 mg total) by mouth 2 (two) times daily for 10 days.  04/27/18 05/07/18  Setzer, Rona Ravens, NP  dicyclomine (BENTYL) 20 MG tablet Take 1 tablet (20 mg total) by mouth 4 (four) times daily -  before meals and at bedtime. 05/01/18   Rolland Porter, MD  lamoTRIgine (LAMICTAL) 100 MG tablet Take 1.5 tablets (150 mg total) by mouth 2 (two) times daily. 01/12/18   Donnal Moat T, PA-C  metroNIDAZOLE (FLAGYL) 500 MG tablet Take 1 tablet (500 mg total) by mouth 2 (two) times daily for 14 days. 04/27/18 05/11/18  Setzer, Rona Ravens, NP  ondansetron (ZOFRAN) 4 MG tablet Take 1 tablet (4 mg total) by mouth every 8 (eight) hours as needed for nausea or vomiting. 05/01/18   Rolland Porter, MD  traZODone (DESYREL) 100 MG tablet Take 1 tablet (100 mg total) by mouth at bedtime. 01/12/18   Addison Lank, PA-C    Family History Family History  Problem Relation Age of Onset  . Cancer Mother        lichen planus - oral cancer  . Cancer Father        lung  . Cancer Maternal Uncle        uterine  . Diabetes Maternal Uncle   . Stroke Maternal Grandmother   . COPD Paternal Grandfather       Social History Social History   Tobacco Use  . Smoking status: Former Smoker    Packs/day: 0.50    Types: Cigarettes    Last attempt to quit: 12/19/2017    Years since quitting: 0.3  . Smokeless tobacco: Never Used  . Tobacco comment: none in 9 months. after smoking 1 pack a day x 20 yrs.  Substance Use Topics  . Alcohol use: Yes    Alcohol/week: 1.0 standard drinks    Types: 1 Glasses of wine per week    Comment: week  . Drug use: No     Allergies   Patient has no known allergies.   Review of Systems Review of Systems  All other systems reviewed and are negative.    Physical Exam Updated Vital Signs BP 107/73 (BP Location: Right Arm)   Pulse 72   Temp 98.3 F (36.8 C) (Oral)   Resp 18   Ht 5\' 7"  (1.702 m)   Wt 74.8 kg   SpO2 98%   BMI 25.84 kg/m   Vital signs normal    Physical Exam Vitals signs and nursing note reviewed.  Constitutional:      General: She is not in acute distress.    Appearance: Normal appearance. She is well-developed. She is not ill-appearing or toxic-appearing.  HENT:     Head: Normocephalic and atraumatic.     Right Ear: External ear normal.     Left Ear: External ear normal.     Nose: Nose normal. No mucosal edema or rhinorrhea.     Mouth/Throat:     Dentition: No dental abscesses.     Pharynx: No uvula swelling.  Eyes:     Conjunctiva/sclera: Conjunctivae normal.     Pupils: Pupils are equal, round, and reactive to light.  Neck:     Musculoskeletal: Full passive range of motion without pain, normal range of motion and neck supple.  Cardiovascular:     Rate and Rhythm: Normal rate and regular rhythm.     Heart sounds: Normal heart sounds. No murmur. No friction rub. No gallop.   Pulmonary:     Effort: Pulmonary effort is normal. No respiratory distress.     Breath sounds: Normal breath  sounds. No wheezing, rhonchi or rales.  Chest:     Chest wall: No tenderness or crepitus.  Abdominal:     General: Bowel sounds are  normal. There is no distension.     Palpations: Abdomen is soft.     Tenderness: There is abdominal tenderness. There is no guarding or rebound.       Comments: Patient has some discomfort in the left lower quadrant but she seems more tender in the right lower quadrant and over the suprapubic area.  Musculoskeletal: Normal range of motion.        General: No tenderness.     Comments: Moves all extremities well.   Skin:    General: Skin is warm and dry.     Coloration: Skin is not pale.     Findings: No erythema or rash.  Neurological:     Mental Status: She is alert and oriented to person, place, and time.     Cranial Nerves: No cranial nerve deficit.     Comments: Patient becomes very tearful after her exam.  She states she is afraid of what could be wrong.  Psychiatric:        Mood and Affect: Mood is not anxious.        Speech: Speech normal.        Behavior: Behavior normal.      ED Treatments / Results  Labs (all labs ordered are listed, but only abnormal results are displayed) Results for orders placed or performed during the hospital encounter of 04/30/18  CBC with Differential  Result Value Ref Range   WBC 5.0 4.0 - 10.5 K/uL   RBC 3.98 3.87 - 5.11 MIL/uL   Hemoglobin 13.5 12.0 - 15.0 g/dL   HCT 41.7 36.0 - 46.0 %   MCV 104.8 (H) 80.0 - 100.0 fL   MCH 33.9 26.0 - 34.0 pg   MCHC 32.4 30.0 - 36.0 g/dL   RDW 12.1 11.5 - 15.5 %   Platelets 206 150 - 400 K/uL   nRBC 0.0 0.0 - 0.2 %   Neutrophils Relative % 64 %   Neutro Abs 3.2 1.7 - 7.7 K/uL   Lymphocytes Relative 24 %   Lymphs Abs 1.2 0.7 - 4.0 K/uL   Monocytes Relative 11 %   Monocytes Absolute 0.5 0.1 - 1.0 K/uL   Eosinophils Relative 1 %   Eosinophils Absolute 0.1 0.0 - 0.5 K/uL   Basophils Relative 0 %   Basophils Absolute 0.0 0.0 - 0.1 K/uL   Immature Granulocytes 0 %   Abs Immature Granulocytes 0.01 0.00 - 0.07 K/uL  Comprehensive metabolic panel  Result Value Ref Range   Sodium 138 135 - 145 mmol/L     Potassium 3.4 (L) 3.5 - 5.1 mmol/L   Chloride 107 98 - 111 mmol/L   CO2 25 22 - 32 mmol/L   Glucose, Bld 100 (H) 70 - 99 mg/dL   BUN 8 8 - 23 mg/dL   Creatinine, Ser 0.66 0.44 - 1.00 mg/dL   Calcium 9.1 8.9 - 10.3 mg/dL   Total Protein 5.9 (L) 6.5 - 8.1 g/dL   Albumin 3.7 3.5 - 5.0 g/dL   AST 24 15 - 41 U/L   ALT 19 0 - 44 U/L   Alkaline Phosphatase 81 38 - 126 U/L   Total Bilirubin 0.4 0.3 - 1.2 mg/dL   GFR calc non Af Amer >60 >60 mL/min   GFR calc Af Amer >60 >60 mL/min   Anion gap  6 5 - 15  Lipase, blood  Result Value Ref Range   Lipase 32 11 - 51 U/L  Urinalysis, Routine w reflex microscopic  Result Value Ref Range   Color, Urine YELLOW YELLOW   APPearance CLEAR CLEAR   Specific Gravity, Urine >1.046 (H) 1.005 - 1.030   pH 6.0 5.0 - 8.0   Glucose, UA NEGATIVE NEGATIVE mg/dL   Hgb urine dipstick NEGATIVE NEGATIVE   Bilirubin Urine NEGATIVE NEGATIVE   Ketones, ur NEGATIVE NEGATIVE mg/dL   Protein, ur NEGATIVE NEGATIVE mg/dL   Nitrite NEGATIVE NEGATIVE   Leukocytes,Ua NEGATIVE NEGATIVE   Laboratory interpretation all normal except elevation of the MCV consistent with vitamin B12/folic acid deficiency.  Patient specific gravity is high, consistent with dehydration    EKG None  Radiology Ct Abdomen Pelvis W Contrast  Result Date: 05/01/2018 CLINICAL DATA:  63 year old female with lower abdominal pain. EXAM: CT ABDOMEN AND PELVIS WITH CONTRAST TECHNIQUE: Multidetector CT imaging of the abdomen and pelvis was performed using the standard protocol following bolus administration of intravenous contrast. CONTRAST:  177mL OMNIPAQUE IOHEXOL 300 MG/ML  SOLN COMPARISON:  CT of the abdomen pelvis dated 09/11/2016 FINDINGS: Lower chest: There are bibasilar linear atelectasis/scarring. A 4 mm right lung base calcified granuloma. No intra-abdominal free air or free fluid. Hepatobiliary: The liver is unremarkable. No intrahepatic biliary ductal dilatation. The gallbladder is  unremarkable. Pancreas: Unremarkable. No pancreatic ductal dilatation or surrounding inflammatory changes. Spleen: Normal in size without focal abnormality. Adrenals/Urinary Tract: The adrenal glands are unremarkable. The kidneys, visualized ureters, and urinary bladder appear unremarkable. Subcentimeter left renal hypodense lesion is too small to characterize. Stomach/Bowel: Loose stool noted throughout the colon. There is no bowel obstruction or active inflammation. Normal appendix. Vascular/Lymphatic: The abdominal aorta and IVC are unremarkable. No portal venous gas. There is no adenopathy. Reproductive: The uterus and ovaries are grossly unremarkable. No pelvic mass. Other: None Musculoskeletal: No acute or significant osseous findings. IMPRESSION: Loose stool noted throughout the colon. No bowel obstruction or active inflammation. Normal appendix. Electronically Signed   By: Anner Crete M.D.   On: 05/01/2018 02:43    Procedures Procedures (including critical care time)  Medications Ordered in ED Medications  sodium chloride 0.9 % bolus 1,000 mL (0 mLs Intravenous Stopped 05/01/18 0256)  fentaNYL (SUBLIMAZE) injection 50 mcg (50 mcg Intravenous Given 05/01/18 0020)  ondansetron (ZOFRAN) injection 4 mg (4 mg Intravenous Given 05/01/18 0021)  iohexol (OMNIPAQUE) 300 MG/ML solution 100 mL (100 mLs Intravenous Contrast Given 05/01/18 0152)  sodium chloride 0.9 % bolus 1,000 mL (1,000 mLs Intravenous New Bag/Given 05/01/18 0342)  dicyclomine (BENTYL) injection 20 mg (20 mg Intramuscular Given 05/01/18 0343)     Initial Impression / Assessment and Plan / ED Course  I have reviewed the triage vital signs and the nursing notes.  Pertinent labs & imaging results that were available during my care of the patient were reviewed by me and considered in my medical decision making (see chart for details).        Patient was given IV fluids IV pain and nausea medication.  CT scan was ordered.  3 AM I  talked to the patient about her CT results.  Patient had told me her doctor was tapering her off of her of her Xanax.  When I reviewed the database she was on number 91 mg tablets of Xanax, through October 24 then on November 7 her doctor changed her to 105 of the 1/2 mg tablets.  They were  last filled on January 27.  Patient now wants to tell me about her new sexual partner who is "quite large".  That is why she saw Dr. Barrie Dunker and was diagnosed with a strep infection.  She states they are using protection.  She was given Bentyl to see if that would help with her discomfort that she describes as a cramping pain in her lower abdomen.  Patient reports the Bentyl has helped with her discomfort.  She was discharged home..  Final Clinical Impressions(s) / ED Diagnoses   Final diagnoses:  Lower abdominal pain    ED Discharge Orders         Ordered    dicyclomine (BENTYL) 20 MG tablet  3 times daily before meals & bedtime     05/01/18 0443    ondansetron (ZOFRAN) 4 MG tablet  Every 8 hours PRN     05/01/18 0444         Plan discharge  Rolland Porter, MD, Barbette Or, MD 05/01/18 [phone]

## 2018-05-02 ENCOUNTER — Ambulatory Visit (INDEPENDENT_AMBULATORY_CARE_PROVIDER_SITE_OTHER): Payer: BLUE CROSS/BLUE SHIELD | Admitting: Internal Medicine

## 2018-05-02 ENCOUNTER — Encounter (INDEPENDENT_AMBULATORY_CARE_PROVIDER_SITE_OTHER): Payer: Self-pay | Admitting: Internal Medicine

## 2018-05-02 VITALS — BP 117/80 | HR 71 | Temp 98.2°F | Ht 67.0 in | Wt 169.1 lb

## 2018-05-02 DIAGNOSIS — K5732 Diverticulitis of large intestine without perforation or abscess without bleeding: Secondary | ICD-10-CM | POA: Diagnosis not present

## 2018-05-02 DIAGNOSIS — R1032 Left lower quadrant pain: Secondary | ICD-10-CM | POA: Diagnosis not present

## 2018-05-02 NOTE — Progress Notes (Signed)
Subjective:    Patient ID: [name], female    DOB: 03/27/1955, 63 y.o.   MRN: [id number]  HPI Here today for f/u. I talked with patient 04/27/2018. She was c/o that she was having a diverticular flare. Hx of same. I sent and Rx in for Flagyl and Cipro x 14 days.  She apparently presented to the ED 04/30/2018 with abdominal pain.  Having pain in her left lower abdomen.  Stools ae loose which is normal. No bleeding. No fever but does have chills. She underwent a CT scan which revealed: loose stool thru the colon. No bowel obstruction or active inflammation. Normal appendix.  She tells me the pain in her LLQ pain is better. She feels 35% better. Her appetite is good. No weight loss.   CBC    Component Value Date/Time   WBC 5.0 04/30/2018 2346   RBC 3.98 04/30/2018 2346   HGB 13.5 04/30/2018 2346   HCT 41.7 04/30/2018 2346   PLT 206 04/30/2018 2346   MCV 104.8 (H) 04/30/2018 2346   MCH 33.9 04/30/2018 2346   MCHC 32.4 04/30/2018 2346   RDW 12.1 04/30/2018 2346   LYMPHSABS 1.2 04/30/2018 2346   MONOABS 0.5 04/30/2018 2346   EOSABS 0.1 04/30/2018 2346   BASOSABS 0.0 04/30/2018 2346   CMP Latest Ref Rng & Units 04/30/2018 09/11/2016 04/08/2016  Glucose 70 - 99 mg/dL 100(H) 122(H) 92  BUN 8 - 23 mg/dL 8 11 9   Creatinine 0.44 - 1.00 mg/dL 0.66 0.64 0.94  Sodium 135 - 145 mmol/L 138 141 139  Potassium 3.5 - 5.1 mmol/L 3.4(L) 4.1 3.4(L)  Chloride 98 - 111 mmol/L 107 105 100  CO2 22 - 32 mmol/L 25 26 29   Calcium 8.9 - 10.3 mg/dL 9.1 9.5 9.2  Total Protein 6.5 - 8.1 g/dL 5.9(L) 6.8 6.2  Total Bilirubin 0.3 - 1.2 mg/dL 0.4 0.7 0.7  Alkaline Phos 38 - 126 U/L 81 93 66  AST 15 - 41 U/L 24 17 17   ALT 0 - 44 U/L 19 17 16      Review of Systems Past Medical History:  Diagnosis Date  . Anxiety   . Bipolar 1 disorder ([place])   . Depression   . Diverticulitis   . Diverticulitis of colon 03/15/2016    Past Surgical History:  Procedure Laterality Date  . COLONOSCOPY    .  oophrorectomy    . TUBAL LIGATION      No Known Allergies  Current Outpatient Medications on File Prior to Visit  Medication Sig Dispense Refill  . ALPRAZolam (XANAX) 0.5 MG tablet 1.75 pills in a.m. and 1.5 pills in pm for 2 weeks, then 1.5 pills bid for 2 wks, then 1.25 pills bid until next visit 105 tablet 0  . buPROPion (WELLBUTRIN XL) 150 MG 24 hr tablet Take 3 tablets (450 mg total) by mouth daily. 270 tablet 1  . busPIRone (BUSPAR) 15 MG tablet 1 TABLET TWICE DAILY 180 tablet 0  . ciprofloxacin (CIPRO) 500 MG tablet Take 1 tablet (500 mg total) by mouth 2 (two) times daily for 10 days. 28 tablet 0  . dicyclomine (BENTYL) 20 MG tablet Take 1 tablet (20 mg total) by mouth 4 (four) times daily -  before meals and at bedtime. 40 tablet 0  . lamoTRIgine (LAMICTAL) 100 MG tablet Take 1.5 tablets (150 mg total) by mouth 2 (two) times daily. 270 tablet 1  . metroNIDAZOLE (FLAGYL) 500 MG tablet Take 1 tablet (500 mg total)  by mouth 2 (two) times daily for 14 days. 28 tablet 0  . ondansetron (ZOFRAN) 4 MG tablet Take 1 tablet (4 mg total) by mouth every 8 (eight) hours as needed for nausea or vomiting. 10 tablet 0  . traZODone (DESYREL) 100 MG tablet Take 1 tablet (100 mg total) by mouth at bedtime. 90 tablet 1   No current facility-administered medications on file prior to visit.         Objective:   Physical Exam Blood pressure 117/80, pulse 71, temperature 98.2 F (36.8 C), height 5\' 7"  (1.702 m), weight 169 lb 1.6 oz (76.7 kg). Alert and oriented. Skin warm and dry. Oral mucosa is moist.   . Sclera anicteric, conjunctivae is pink. Thyroid not enlarged. No cervical lymphadenopathy. Lungs clear. Heart regular rate and rhythm.  Abdomen is soft. Bowel sounds are positive. No hepatomegaly. No abdominal masses felt. No tenderness.  No edema to lower extremities.        Assessment & Plan:  LLQ pain. Hx of diverticulitis. Recent CT was negative for diverticulitis.  She will continue the  cipro and Flagyl. If the pain worsens, go to the ED.

## 2018-05-02 NOTE — Patient Instructions (Signed)
Continue the Cipro and Flagyl OV in 1 year.

## 2018-05-04 ENCOUNTER — Other Ambulatory Visit: Payer: Self-pay

## 2018-05-04 ENCOUNTER — Encounter (HOSPITAL_COMMUNITY): Payer: Self-pay | Admitting: Emergency Medicine

## 2018-05-04 ENCOUNTER — Emergency Department (HOSPITAL_COMMUNITY)
Admission: EM | Admit: 2018-05-04 | Discharge: 2018-05-04 | Disposition: A | Discharge status: Home / Self Care | Payer: BLUE CROSS/BLUE SHIELD | Attending: Emergency Medicine | Admitting: Emergency Medicine

## 2018-05-04 DIAGNOSIS — R1030 Lower abdominal pain, unspecified: Secondary | ICD-10-CM

## 2018-05-04 DIAGNOSIS — Z87891 Personal history of nicotine dependence: Secondary | ICD-10-CM | POA: Diagnosis not present

## 2018-05-04 DIAGNOSIS — Z79899 Other long term (current) drug therapy: Secondary | ICD-10-CM | POA: Diagnosis not present

## 2018-05-04 NOTE — ED Provider Notes (Signed)
[facility] EMERGENCY DEPARTMENT Provider Note   CSN: [id number] Arrival date & time: 05/04/18  3329    History   Chief Complaint Chief Complaint  Patient presents with  . Abdominal Pain    HPI [name] is a 64 y.o. female past medical history significant for anxiety and bipolar disorder and depression.     Patient is here with complaints of abdominal pain and cramping as well as hand tremors.  She was seen here on Sunday night pain which she says has not acutely worsened.  Her biggest concern today is her hand tremors which she "feels on the inside" and is starting to affect her whole body shaking.  She states for the last 3 days she has continued to have cramping lower abdominal pain, she is very concerned because she is usually able to feel her cervix from the inside a couple of days ago she is not able to feel and she was worried that her "womb was gone" but after a prolonged episode of straining down she is able to feel it again to her relief.  She is also worried because 1 week ago she took some extra Wellbutrin and asked him because she forgot.  She took 3 more tablets than usual.  She thinks this has something to do with her tremulousness today.  She feels certain patches on left side of her body feel different on her left cheek and her left thigh.  Is also concerned because she is not able to spell as well as she usually does but she has not had any slurred speech.  Her psychiatrist is tapering down her Xanax by quarter tablet a month, last change was made 3 to 4 weeks ago.  She voices many stressors in her life recently.  Most recent was having unprotected sex with a man who was "very gentle with her but was very large"       Past Medical History:  Diagnosis Date  . Anxiety   . Bipolar 1 disorder ([place])   . Depression   . Diverticulitis   . Diverticulitis of colon 03/15/2016    Patient Active Problem List   Diagnosis Date Noted  . Depressed bipolar I disorder in  partial remission ([place]) 01/01/2018  . Insomnia 01/01/2018  . Anxiety state 01/01/2018  . Tobacco abuse 06/25/2016  . Diverticulitis of colon 03/15/2016  . Bipolar disorder ([place]) 12/12/2013    Past Surgical History:  Procedure Laterality Date  . COLONOSCOPY    . oophrorectomy    . TUBAL LIGATION       OB History    Gravida      Para      Term      Preterm      AB      Living  2     SAB      TAB      Ectopic      Multiple      Live Births               Home Medications    Prior to Admission medications   Medication Sig Start Date End Date Taking? Authorizing Provider  ALPRAZolam Duanne Moron) 0.5 MG tablet 1.75 pills in a.m. and 1.5 pills in pm for 2 weeks, then 1.5 pills bid for 2 wks, then 1.25 pills bid until next visit 04/07/18   Donnal Moat T, PA-C  buPROPion (WELLBUTRIN XL) 150 MG 24 hr tablet Take 3 tablets (450 mg total) by mouth  daily. 01/12/18   Donnal Moat T, PA-C  busPIRone (BUSPAR) 15 MG tablet 1 TABLET TWICE DAILY 04/07/18   Donnal Moat T, PA-C  ciprofloxacin (CIPRO) 500 MG tablet Take 1 tablet (500 mg total) by mouth 2 (two) times daily for 10 days. 04/27/18 05/07/18  Setzer, Rona Ravens, NP  dicyclomine (BENTYL) 20 MG tablet Take 1 tablet (20 mg total) by mouth 4 (four) times daily -  before meals and at bedtime. 05/01/18   Rolland Porter, MD  lamoTRIgine (LAMICTAL) 100 MG tablet Take 1.5 tablets (150 mg total) by mouth 2 (two) times daily. 01/12/18   Donnal Moat T, PA-C  metroNIDAZOLE (FLAGYL) 500 MG tablet Take 1 tablet (500 mg total) by mouth 2 (two) times daily for 14 days. 04/27/18 05/11/18  Setzer, Rona Ravens, NP  ondansetron (ZOFRAN) 4 MG tablet Take 1 tablet (4 mg total) by mouth every 8 (eight) hours as needed for nausea or vomiting. 05/01/18   Rolland Porter, MD  traZODone (DESYREL) 100 MG tablet Take 1 tablet (100 mg total) by mouth at bedtime. 01/12/18   Addison Lank, PA-C    Family History Family History  Problem Relation Age of Onset  . Cancer  Mother        lichen planus - oral cancer  . Cancer Father        lung  . Cancer Maternal Uncle        uterine  . Diabetes Maternal Uncle   . Stroke Maternal Grandmother   . COPD Paternal Grandfather     Social History Social History   Tobacco Use  . Smoking status: Former Smoker    Packs/day: 0.50    Types: Cigarettes    Last attempt to quit: 12/19/2017    Years since quitting: 0.3  . Smokeless tobacco: Never Used  . Tobacco comment: none in 9 months. after smoking 1 pack a day x 20 yrs.  Substance Use Topics  . Alcohol use: Yes    Alcohol/week: 1.0 standard drinks    Types: 1 Glasses of wine per week    Comment: week  . Drug use: No     Allergies   Patient has no known allergies.   Review of Systems Review of Systems  Constitutional: Negative for chills and fever.  Respiratory: Negative for shortness of breath.   Cardiovascular: Negative for chest pain.  Gastrointestinal: Positive for abdominal distention and abdominal pain. Negative for blood in stool, constipation, diarrhea, nausea and vomiting.  Genitourinary: Negative for dysuria and vaginal discharge.  Neurological: Positive for tremors. Negative for dizziness, syncope, weakness and headaches.  Psychiatric/Behavioral: The patient is nervous/anxious.      Physical Exam Updated Vital Signs BP 116/63 (BP Location: Left Arm)   Pulse 63   Temp 98.1 F (36.7 C) (Oral)   Resp 17   Ht 5\' 7"  (1.702 m)   Wt 76 kg   SpO2 95%   BMI 26.24 kg/m   Physical Exam Exam conducted with a chaperone present.  Constitutional:      Appearance: She is well-developed. She is not ill-appearing, toxic-appearing or diaphoretic.  HENT:     Head: Normocephalic and atraumatic.     Mouth/Throat:     Mouth: Mucous membranes are moist.     Pharynx: Oropharynx is clear.  Eyes:     Extraocular Movements: Extraocular movements intact.     Pupils: Pupils are equal, round, and reactive to light.  Cardiovascular:     Rate and  Rhythm: Normal rate and  regular rhythm.     Heart sounds: No murmur.  Pulmonary:     Effort: Pulmonary effort is normal.     Breath sounds: Normal breath sounds.  Abdominal:     General: Bowel sounds are normal.     Palpations: Abdomen is soft.     Tenderness: There is no abdominal tenderness.  Genitourinary:    General: Normal vulva.     Urethra: No prolapse.     Vagina: Normal.     Cervix: Normal.     Uterus: Normal.      Adnexa: Right adnexa normal and left adnexa normal.  Skin:    General: Skin is warm and dry.  Neurological:     Mental Status: She is alert and oriented to person, place, and time.     Cranial Nerves: No cranial nerve deficit.     Motor: No weakness.     Coordination: Coordination normal. Finger-Nose-Finger Test normal.     Deep Tendon Reflexes: Reflexes are normal and symmetric. Reflexes normal.  Psychiatric:        Attention and Perception: Attention and perception normal.        Mood and Affect: Mood is anxious (tearful).        Speech: Speech normal. Speech is not slurred.        Thought Content: Thought content normal.      ED Treatments / Results  Labs (all labs ordered are listed, but only abnormal results are displayed) Labs Reviewed - No data to display  EKG None  Radiology No results found.  Procedures Procedures (including critical care time)  Medications Ordered in ED Medications - No data to display   Initial Impression / Assessment and Plan / ED Course  I have reviewed the triage vital signs and the nursing notes.  Pertinent labs & imaging results that were available during my care of the patient were reviewed by me and considered in my medical decision making (see chart for details).        Patient has had several weeks of lower abdominal pain without acute worsening.  She had a recent CT on 05/01/2018 without any evidence of active inflammation but has been on Cipro and Flagyl per gastroenterology for her history of  diverticulitis.  Discussed with patient that antibiotics may be impeding her gut flora and causing increased cramping and that she should discontinue.  Pelvic exam today unremarkable.  Advised outpatient follow-up with her GYN.  Patient was reassured and voiced good understanding regarding the need for outpatient follow-up.  She would like to relocate her primary care doctor locally to [facility] and was provided with some options.  Final Clinical Impressions(s) / ED Diagnoses   Final diagnoses:  Lower abdominal pain    ED Discharge Orders    None       Bufford Lope, DO 05/04/18 1432    Elnora Morrison, MD 05/07/18 0020

## 2018-05-04 NOTE — Discharge Instructions (Signed)
Stop the antibiotics. Follow up with your Gyn doctor. Information for 2 options primary care in [place] are on this paper for you.

## 2018-05-04 NOTE — ED Triage Notes (Signed)
Abd pain and lower abd pain cramping x 3 weeks.   Also c/o hand tremors

## 2018-05-30 ENCOUNTER — Other Ambulatory Visit: Payer: Self-pay | Admitting: Physician Assistant

## 2018-05-30 ENCOUNTER — Telehealth: Payer: Self-pay | Admitting: Physician Assistant

## 2018-05-30 MED ORDER — ALPRAZOLAM 0.5 MG PO TABS: ORAL_TABLET | ORAL | 1 refills | Status: DC

## 2018-05-30 NOTE — Telephone Encounter (Signed)
Pt needs refill on alprazolam .5mg  sent to [facility] in [place] on [address]. Pt is completely out.

## 2018-05-30 NOTE — Telephone Encounter (Signed)
Please confirm dosage

## 2018-06-01 ENCOUNTER — Other Ambulatory Visit: Payer: Self-pay

## 2018-06-01 NOTE — Telephone Encounter (Signed)
Rx fill sent by [facility].

## 2018-07-26 ENCOUNTER — Other Ambulatory Visit: Payer: Self-pay | Admitting: Physician Assistant

## 2018-07-26 NOTE — Telephone Encounter (Signed)
Last visit 03/2018, follow up due in 6 weeks. [place] office asked to call her for appt.

## 2018-08-04 ENCOUNTER — Telehealth: Payer: Self-pay | Admitting: Physician Assistant

## 2018-08-04 ENCOUNTER — Other Ambulatory Visit: Payer: Self-pay | Admitting: Physician Assistant

## 2018-08-04 MED ORDER — ALPRAZOLAM 0.5 MG PO TABS: ORAL_TABLET | ORAL | 0 refills | Status: DC

## 2018-08-04 NOTE — Telephone Encounter (Signed)
Traci, please let her know she is supposed to be taking no more than 1.25 pills twice daily.  She has been weaning off and that was the way we left it at the last visit.  I will refill this early this time but I do not want her taking more than that and I am not going to refill it early again.

## 2018-08-04 NOTE — Telephone Encounter (Signed)
Left detailed information and to call back with concerns or questions.

## 2018-08-04 NOTE — Telephone Encounter (Signed)
Patient need partial refill until her visit with you on generic Xanax on last script it showed to cut back on medication but it was discussed for her not to cut back and due to this she is running a week short send to [facility] in [place] on [address]

## 2018-08-18 ENCOUNTER — Encounter: Payer: Self-pay | Admitting: Physician Assistant

## 2018-08-18 ENCOUNTER — Ambulatory Visit (INDEPENDENT_AMBULATORY_CARE_PROVIDER_SITE_OTHER): Payer: BC Managed Care – PPO | Admitting: Physician Assistant

## 2018-08-18 ENCOUNTER — Other Ambulatory Visit: Payer: Self-pay

## 2018-08-18 DIAGNOSIS — F319 Bipolar disorder, unspecified: Secondary | ICD-10-CM

## 2018-08-18 DIAGNOSIS — F411 Generalized anxiety disorder: Secondary | ICD-10-CM

## 2018-08-18 DIAGNOSIS — G4700 Insomnia, unspecified: Secondary | ICD-10-CM

## 2018-08-18 MED ORDER — LAMOTRIGINE 100 MG PO TABS: 150.0000 mg | ORAL_TABLET | Freq: Two times a day (BID) | ORAL | 1 refills | Status: DC

## 2018-08-18 NOTE — Progress Notes (Signed)
[facility] Med Check  Patient ID: [name],  MRN: [id number]  PCP: [facility]  Date of Evaluation: 08/18/2018 Time spent:15 minutes  Chief Complaint:  Chief Complaint    Depression; Anxiety; Follow-up      HISTORY/CURRENT STATUS: HPI  For routine med check.    Is still weaning off the Xanax and is doing great!  Much more clear headed.  Anxiety is controlled.  She can focus on things a lot better.  He is very happy that she has gotten down as low as she has.  She does still have some anxiety but feels that it is tolerable.  Right now she is taking Xanax 0.5 mg 1.5 pills every morning and 1 pill nightly.  Patient denies loss of interest in usual activities and is able to enjoy things.  Denies decreased energy or motivation.  Appetite has not changed.  No extreme sadness, tearfulness, or feelings of hopelessness.  Denies any changes in concentration, making decisions or remembering things.  Denies suicidal or homicidal thoughts.  Denies dizziness, syncope, seizures, numbness, tingling, tics, unsteady gait, slurred speech, confusion. Denies muscle or joint pain, stiffness, or dystonia.  Reports occasionally having a tremor in her hands that will last maybe 30 minutes or so and then goes away.  It is usually in the morning and seems to be when there is something that triggers anxiety.  Individual Medical History/ Review of Systems: Changes? :No    Past medications for mental health diagnoses include: Effexor, Ativan, Paxil, Lithobid, Xanax, Lamictal, trazodone, Saphris, Zoloft, Seroquel, BuSpar  Allergies: Meperidine  Current Medications:  Current Outpatient Medications:  .  ALPRAZolam (XANAX) 0.5 MG tablet, 1.25 pills bid prn. (Patient taking differently: 1.5 q am, 1 qhs.), Disp: 75 tablet, Rfl: 0 .  buPROPion (WELLBUTRIN XL) 150 MG 24 hr tablet, TAKE 3 TABLETS(450 MG) BY MOUTH DAILY, Disp: 270 tablet, Rfl: 0 .  busPIRone (BUSPAR) 15 MG  tablet, TAKE 1 TABLET BY MOUTH TWICE DAILY, Disp: 180 tablet, Rfl: 0 .  dicyclomine (BENTYL) 20 MG tablet, Take 1 tablet (20 mg total) by mouth 4 (four) times daily -  before meals and at bedtime., Disp: 40 tablet, Rfl: 0 .  lamoTRIgine (LAMICTAL) 100 MG tablet, Take 1.5 tablets (150 mg total) by mouth 2 (two) times daily., Disp: 270 tablet, Rfl: 1 .  ondansetron (ZOFRAN) 4 MG tablet, Take 1 tablet (4 mg total) by mouth every 8 (eight) hours as needed for nausea or vomiting., Disp: 10 tablet, Rfl: 0 .  traZODone (DESYREL) 100 MG tablet, TAKE 1 TABLET(100 MG) BY MOUTH AT BEDTIME, Disp: 90 tablet, Rfl: 0 Medication Side Effects: none  Family Medical/ Social History: Changes? No  MENTAL HEALTH EXAM:  There were no vitals taken for this visit.There is no height or weight on file to calculate BMI.  General Appearance: Casual  Eye Contact:  Good  Speech:  Clear and Coherent  Volume:  Normal  Mood:  Euthymic  Affect:  Appropriate  Thought Process:  Goal Directed  Orientation:  Full (Time, Place, and Person)  Thought Content: Logical   Suicidal Thoughts:  No  Homicidal Thoughts:  No  Memory:  WNL  Judgement:  Good  Insight:  Good  Psychomotor Activity:  Normal  Concentration:  Concentration: Good  Recall:  Good  Fund of Knowledge: Good  Language: Good  Assets:  Desire for Improvement  ADL's:  Intact  Cognition: WNL  Prognosis:  Good    DIAGNOSES:  ICD-10-CM   1. Generalized anxiety disorder  F41.1   2. Bipolar I disorder ([place])  F31.9   3. Insomnia, unspecified type  G47.00     Receiving Psychotherapy: No    RECOMMENDATIONS:  I am happy to see her weaning down so well on the Xanax. Decrease Xanax 0.5 mg to 1.25 pills every morning and 1 pill nightly for 2 weeks and then decrease to 1 pill twice daily.  She can decrease by 0.25 pills q. 2 weeks but she knows not to go faster than that.  If she starts becoming more anxious after the decrease, go back to the previous dose.  We  discussed the fact that the goal is to get off the Xanax completely and treat the anxiety in another way, which she has to do but if we are unable to completely go off of it, do not see it as a failure but as a positive thing that she was able to decrease the dose is much is possible.  She understands and agrees. Continue Wellbutrin XL 150 mg, 3 every morning. Continue BuSpar 15 mg twice daily.  We discussed that we could increase this over the phone if needed. Continue Lamictal 150 mg twice daily. Continue trazodone 100 mg nightly as needed. Return in 3 months.  Donnal Moat, PA-C   This record has been created using [employer].  Chart creation errors have been sought, but may not always have been located and corrected. Such creation errors do not reflect on the standard of medical care.

## 2018-09-17 DIAGNOSIS — K529 Noninfective gastroenteritis and colitis, unspecified: Secondary | ICD-10-CM | POA: Insufficient documentation

## 2018-09-19 ENCOUNTER — Ambulatory Visit (INDEPENDENT_AMBULATORY_CARE_PROVIDER_SITE_OTHER): Payer: BLUE CROSS/BLUE SHIELD | Admitting: Internal Medicine

## 2018-09-19 MED ORDER — DICYCLOMINE HCL 20 MG PO TABS
20.00 | ORAL_TABLET | ORAL | Status: DC
Start: ? — End: 2018-09-19

## 2018-09-19 MED ORDER — ACETAMINOPHEN 325 MG PO TABS
650.00 | ORAL_TABLET | ORAL | Status: DC
Start: ? — End: 2018-09-19

## 2018-09-19 MED ORDER — SODIUM CHLORIDE 0.9 % IV SOLN
125.00 | INTRAVENOUS | Status: DC
Start: ? — End: 2018-09-19

## 2018-09-19 MED ORDER — NITROGLYCERIN 0.4 MG SL SUBL
0.40 | SUBLINGUAL_TABLET | SUBLINGUAL | Status: DC
Start: ? — End: 2018-09-19

## 2018-09-19 MED ORDER — BUSPIRONE HCL 15 MG PO TABS
15.00 | ORAL_TABLET | ORAL | Status: DC
Start: 2018-09-18 — End: 2018-09-19

## 2018-09-19 MED ORDER — HYDROXYZINE HCL 25 MG PO TABS
25.00 | ORAL_TABLET | ORAL | Status: DC
Start: ? — End: 2018-09-19

## 2018-09-19 MED ORDER — TRAZODONE HCL 100 MG PO TABS
100.00 | ORAL_TABLET | ORAL | Status: DC
Start: ? — End: 2018-09-19

## 2018-09-19 MED ORDER — ENOXAPARIN SODIUM 40 MG/0.4ML ~~LOC~~ SOLN
40.00 | SUBCUTANEOUS | Status: DC
Start: ? — End: 2018-09-19

## 2018-09-19 MED ORDER — GENERIC EXTERNAL MEDICATION
150.00 | Status: DC
Start: 2018-09-18 — End: 2018-09-19

## 2018-09-19 MED ORDER — HYDRALAZINE HCL 20 MG/ML IJ SOLN
10.00 | INTRAMUSCULAR | Status: DC
Start: ? — End: 2018-09-19

## 2018-09-19 MED ORDER — BUPROPION HCL ER (XL) 150 MG PO TB24
450.00 | ORAL_TABLET | ORAL | Status: DC
Start: 2018-09-19 — End: 2018-09-19

## 2018-09-19 MED ORDER — MORPHINE SULFATE 2 MG/ML IJ SOLN
2.00 | INTRAMUSCULAR | Status: DC
Start: ? — End: 2018-09-19

## 2018-09-19 MED ORDER — ONDANSETRON HCL 4 MG/2ML IJ SOLN
4.00 | INTRAMUSCULAR | Status: DC
Start: ? — End: 2018-09-19

## 2018-09-19 MED ORDER — ALPRAZOLAM 0.5 MG PO TABS
0.50 | ORAL_TABLET | ORAL | Status: DC
Start: 2018-09-18 — End: 2018-09-19

## 2018-09-25 ENCOUNTER — Other Ambulatory Visit: Payer: Self-pay | Admitting: Physician Assistant

## 2018-09-25 ENCOUNTER — Telehealth: Payer: Self-pay | Admitting: Physician Assistant

## 2018-09-25 NOTE — Telephone Encounter (Signed)
Pended for approval.

## 2018-09-25 NOTE — Telephone Encounter (Signed)
Pt called and said she needs refills, last fill 06/17 she verbalized she's but back but she's out now.   Has follow up in Sept

## 2018-09-25 NOTE — Telephone Encounter (Signed)
Patient out of Altrazolam, had refill on 06/17 for 75 tablets, pharmacy [facility] on [address] in [place] is saying she doesn't have anymore refills.  Patient is out of Altrazolam, pt stated she has cut back and will feel you in later.  Patient taking 1 in the am and 1/2 at night

## 2018-10-02 ENCOUNTER — Other Ambulatory Visit: Payer: Self-pay

## 2018-10-02 ENCOUNTER — Ambulatory Visit (INDEPENDENT_AMBULATORY_CARE_PROVIDER_SITE_OTHER): Payer: BC Managed Care – PPO | Admitting: Internal Medicine

## 2018-10-02 ENCOUNTER — Encounter (INDEPENDENT_AMBULATORY_CARE_PROVIDER_SITE_OTHER): Payer: Self-pay | Admitting: Internal Medicine

## 2018-10-02 ENCOUNTER — Ambulatory Visit (INDEPENDENT_AMBULATORY_CARE_PROVIDER_SITE_OTHER): Payer: Self-pay | Admitting: Internal Medicine

## 2018-10-02 VITALS — BP 101/69 | HR 74 | Temp 97.6°F | Ht 67.0 in | Wt 165.2 lb

## 2018-10-02 DIAGNOSIS — D369 Benign neoplasm, unspecified site: Secondary | ICD-10-CM

## 2018-10-02 DIAGNOSIS — K5732 Diverticulitis of large intestine without perforation or abscess without bleeding: Secondary | ICD-10-CM

## 2018-10-02 HISTORY — DX: Benign neoplasm, unspecified site: D36.9

## 2018-10-02 NOTE — Progress Notes (Signed)
Subjective:    Patient ID: [name], female    DOB: 04-Aug-1955, 63 y.o.   MRN: [id number]  HPI Here today for f/ul. Last seen in February of this year. Hx of diverticulitis.  Her last colonoscopy was in 2012. Hx of tubular adenoma.  Seen in the ED at [facility] about 2 1/2 weeks ago for abdominal pain. .  She tells me she was told her colon was inflamed. She was not covered with any antibiotics.  She underwent a CT of the abdomen which showed : MPRESSION 1. Wall thickening of multiple loops of small bowel likely representing enteritis. No bowel obstruction. No free air or fluid. 2. Colonic diverticulosis without diverticulitis.     She was observed overnight. She tells me today she is better. She is on a bland diet. If she overeats, she has left abdomen.  She tells me she is 100% now.  Her appetite is good. She is on a probiotic now. Her BMs are normal now.    04/09/2010 Colonoscopy: Hx of tubular adenoma.  PROCEDURE: Colonoscopy.  INDICATION: Vermell is 63 year old Caucasian female with several week  history of diarrhea and negative stool studies. Her last colonoscopy  was over 3 years ago with removal of a small tubular adenoma. She is  undergoing diagnostic colonoscopy. Procedures were reviewed with the  patient. Informed consent was obtained.   FINAL DIAGNOSES:  1. Normal terminal ileum.  2. Mild left-sided diverticulosis.  3. Normal colonic mucosa. Biopsies taken from sigmoid colon looking  for microscopic and/or collagenous colitis.  Biopsy: lymphocytic colitis.  Review of Systems Past Medical History:  Diagnosis Date  . Anxiety   . Bipolar 1 disorder ([place])   . Depression   . Diverticulitis   . Diverticulitis of colon 03/15/2016    Past Surgical History:  Procedure Laterality Date  . COLONOSCOPY    . oophrorectomy    . TUBAL LIGATION      Allergies  Allergen Reactions  . Meperidine Hives    Current Outpatient Medications on File Prior to Visit   Medication Sig Dispense Refill  . buPROPion (WELLBUTRIN XL) 150 MG 24 hr tablet TAKE 3 TABLETS(450 MG) BY MOUTH DAILY 270 tablet 0  . busPIRone (BUSPAR) 15 MG tablet TAKE 1 TABLET BY MOUTH TWICE DAILY 180 tablet 0  . dicyclomine (BENTYL) 20 MG tablet Take 1 tablet (20 mg total) by mouth 4 (four) times daily -  before meals and at bedtime. 40 tablet 0  . lamoTRIgine (LAMICTAL) 100 MG tablet Take 1.5 tablets (150 mg total) by mouth 2 (two) times daily. 270 tablet 1  . ondansetron (ZOFRAN) 4 MG tablet Take 1 tablet (4 mg total) by mouth every 8 (eight) hours as needed for nausea or vomiting. 10 tablet 0  . traZODone (DESYREL) 100 MG tablet TAKE 1 TABLET(100 MG) BY MOUTH AT BEDTIME 90 tablet 0  . ALPRAZolam (XANAX) 0.5 MG tablet 1.5 q am, 1 qhs. (Patient taking differently: 1 am, 1 qhs.) 75 tablet 1   No current facility-administered medications on file prior to visit.         Objective:   Physical Exam Blood pressure 101/69, pulse 74, temperature 97.6 F (36.4 C), height 5\' 7"  (1.702 m), weight 165 lb 3.2 oz (74.9 kg). Alert and oriented. Skin warm and dry. Oral mucosa is moist.   . Sclera anicteric, conjunctivae is pink. Thyroid not enlarged. No cervical lymphadenopathy. Lungs clear. Heart regular rate and rhythm.  Abdomen is soft. Bowel sounds are  positive. No hepatomegaly. No abdominal masses felt. No tenderness.  No edema to lower extremities.         Assessment & Plan:  Hx of diverticulitis. She is not having any problems at present. Recently observed at [facility] for enteritis. (She has no GI complaints today). Tubular adenoma: Needs surveillance colonoscopy with propofol.

## 2018-10-02 NOTE — Patient Instructions (Addendum)
The risks of bleeding, perforation and infection were reviewed with patient.  Bland Diet A bland diet consists of foods that are often soft and do not have a lot of fat, fiber, or extra seasonings. Foods without fat, fiber, or seasoning are easier for the body to digest. They are also less likely to irritate your mouth, throat, stomach, and other parts of your digestive system. A bland diet is sometimes called a BRAT diet. What is my plan? Your health care provider or food and nutrition specialist (dietitian) may recommend specific changes to your diet to prevent symptoms or to treat your symptoms. These changes may include:  Eating small meals often.  Cooking food until it is soft enough to chew easily.  Chewing your food well.  Drinking fluids slowly.  Not eating foods that are very spicy, sour, or fatty.  Not eating citrus fruits, such as oranges and grapefruit. What do I need to know about this diet?  Eat a variety of foods from the bland diet food list.  Do not follow a bland diet longer than needed.  Ask your health care provider whether you should take vitamins or supplements. What foods can I eat? Grains  Hot cereals, such as cream of wheat. Rice. Bread, crackers, or tortillas made from refined white flour. Vegetables Canned or cooked vegetables. Mashed or boiled potatoes. Fruits  Bananas. Applesauce. Other types of cooked or canned fruit with the skin and seeds removed, such as canned peaches or pears. Meats and other proteins  Scrambled eggs. Creamy peanut butter or other nut butters. Lean, well-cooked meats, such as chicken or fish. Tofu. Soups or broths. Dairy Low-fat dairy products, such as milk, cottage cheese, or yogurt. Beverages  Water. Herbal tea. Apple juice. Fats and oils Mild salad dressings. Canola or olive oil. Sweets and desserts Pudding. Custard. Fruit gelatin. Ice cream. The items listed above may not be a complete list of recommended foods and  beverages. Contact a dietitian for more options. What foods are not recommended? Grains Whole grain breads and cereals. Vegetables Raw vegetables. Fruits Raw fruits, especially citrus, berries, or dried fruits. Dairy Whole fat dairy foods. Beverages Caffeinated drinks. Alcohol. Seasonings and condiments Strongly flavored seasonings or condiments. Hot sauce. Salsa. Other foods Spicy foods. Fried foods. Sour foods, such as pickled or fermented foods. Foods with high sugar content. Foods high in fiber. The items listed above may not be a complete list of foods and beverages to avoid. Contact a dietitian for more information. Summary  A bland diet consists of foods that are often soft and do not have a lot of fat, fiber, or extra seasonings.  Foods without fat, fiber, or seasoning are easier for the body to digest.  Check with your health care provider to see how long you should follow this diet plan. It is not meant to be followed for long periods. This information is not intended to replace advice given to you by your health care provider. Make sure you discuss any questions you have with your health care provider. Document Released: 06/16/2015 Document Revised: 03/23/2017 Document Reviewed: 03/23/2017 Elsevier Patient Education  2020 [employer].

## 2018-10-03 ENCOUNTER — Other Ambulatory Visit (INDEPENDENT_AMBULATORY_CARE_PROVIDER_SITE_OTHER): Payer: Self-pay | Admitting: Internal Medicine

## 2018-10-03 DIAGNOSIS — D369 Benign neoplasm, unspecified site: Secondary | ICD-10-CM

## 2018-10-03 DIAGNOSIS — K5732 Diverticulitis of large intestine without perforation or abscess without bleeding: Secondary | ICD-10-CM

## 2018-10-20 ENCOUNTER — Other Ambulatory Visit: Payer: Self-pay | Admitting: Physician Assistant

## 2018-11-07 ENCOUNTER — Other Ambulatory Visit: Payer: Self-pay | Admitting: Physician Assistant

## 2018-11-14 ENCOUNTER — Ambulatory Visit: Payer: BC Managed Care – PPO | Admitting: Physician Assistant

## 2018-12-05 ENCOUNTER — Other Ambulatory Visit: Payer: Self-pay | Admitting: Physician Assistant

## 2018-12-05 NOTE — Telephone Encounter (Signed)
Last visit 08/18/2018, due back Sept/Oct

## 2018-12-06 NOTE — Telephone Encounter (Signed)
Tammy, please call her and set up an appointment.  I am sending these prescriptions in but she has to make an appointment and be seen for any more.  Thank you

## 2018-12-11 ENCOUNTER — Emergency Department (HOSPITAL_COMMUNITY): Payer: BC Managed Care – PPO

## 2018-12-11 ENCOUNTER — Other Ambulatory Visit: Payer: Self-pay

## 2018-12-11 ENCOUNTER — Emergency Department (HOSPITAL_COMMUNITY)
Admission: EM | Admit: 2018-12-11 | Discharge: 2018-12-11 | Disposition: A | Discharge status: Home / Self Care | Payer: BC Managed Care – PPO | Attending: Emergency Medicine | Admitting: Emergency Medicine

## 2018-12-11 ENCOUNTER — Encounter (HOSPITAL_COMMUNITY): Payer: Self-pay

## 2018-12-11 DIAGNOSIS — R569 Unspecified convulsions: Secondary | ICD-10-CM | POA: Insufficient documentation

## 2018-12-11 DIAGNOSIS — Z87891 Personal history of nicotine dependence: Secondary | ICD-10-CM | POA: Diagnosis not present

## 2018-12-11 DIAGNOSIS — R0789 Other chest pain: Secondary | ICD-10-CM

## 2018-12-11 DIAGNOSIS — M6281 Muscle weakness (generalized): Secondary | ICD-10-CM | POA: Insufficient documentation

## 2018-12-11 LAB — BASIC METABOLIC PANEL
Anion gap: 8 (ref 5–15)
BUN: 12 mg/dL (ref 8–23)
CO2: 26 mmol/L (ref 22–32)
Calcium: 9.8 mg/dL (ref 8.9–10.3)
Chloride: 108 mmol/L (ref 98–111)
Creatinine, Ser: 0.71 mg/dL (ref 0.44–1.00)
GFR calc Af Amer: >60 mL/min (ref >60)
GFR calc non Af Amer: >60 mL/min (ref >60)
Glucose, Bld: 114 mg/dL — ABNORMAL HIGH (ref 70–99)
Potassium: 3.5 mmol/L (ref 3.5–5.1)
Sodium: 142 mmol/L (ref 135–145)

## 2018-12-11 LAB — CBC
HCT: 43.1 % (ref 36.0–46.0)
Hemoglobin: 14 g/dL (ref 12.0–15.0)
MCH: 33.8 pg (ref 26.0–34.0)
MCHC: 32.5 g/dL (ref 30.0–36.0)
MCV: 104.1 fL — ABNORMAL HIGH (ref 80.0–100.0)
Platelets: 235 10*3/uL (ref 150–400)
RBC: 4.14 MIL/uL (ref 3.87–5.11)
RDW: 11.9 % (ref 11.5–15.5)
WBC: 5.8 10*3/uL (ref 4.0–10.5)
nRBC: 0 % (ref 0.0–0.2)

## 2018-12-11 LAB — TROPONIN I (HIGH SENSITIVITY)
Troponin I (High Sensitivity): <2 ng/L (ref <18)
Troponin I (High Sensitivity): <2 ng/L (ref <18)

## 2018-12-11 MED ORDER — IOHEXOL 350 MG/ML SOLN
100.0000 mL | Freq: Once | INTRAVENOUS | Status: AC | PRN
  Administered 2018-12-11: 100 mL via INTRAVENOUS

## 2018-12-11 MED ORDER — SODIUM CHLORIDE 0.9% FLUSH: 3.0000 mL | Freq: Once | INTRAVENOUS | Status: DC

## 2018-12-11 NOTE — ED Triage Notes (Signed)
Pt presents to ED with complaints of left sided chest pain, left arm pain started this am. Pt describes as heavy. Pt also states she is nauseated. Pt states it started when she woke up this am.

## 2018-12-11 NOTE — ED Notes (Signed)
Pt ambulatory to waiting room. Pt verbalized understanding of discharge instructions.   

## 2018-12-11 NOTE — ED Provider Notes (Signed)
[facility] EMERGENCY DEPARTMENT Provider Note   CSN: [id number] Arrival date & time: 12/11/18  1749     History   Chief Complaint Chief Complaint  Patient presents with   Chest Pain    HPI [name] is a 63 y.o. female with a hx of tobacco abuse, anxiety, depression, & bipolar 1 disorder who presents to the ED w/ complaints of chest & L sided body discomfort that became worse/constant today @ 0400 AM.  Patient states she is had intermittent left-sided chest and body tightness/weakness for about 1 week, this morning she woke up at 4 AM somewhat sweaty with the symptoms.  She states the tightness primarily in her left upper arm seem more severe than usual and that she felt that the left side of her face, arm, and leg were weak.  She states that she has "sensory seizures" after a head injury several years ago, she states the weakness to the left side of her body and face feels similar to prior sensory seizures but that the tightness in her left arm felt different and more severe.  She says that her left chest feels tight and heavy as well.  She states she is not necessarily in pain.  Has had some nausea without vomiting. She is very concerned about her symptoms and feels anxious.  No alleviating or aggravating factors.  No intervention prior to arrival.  Denies lightheadedness, dizziness, visual disturbance, headache, dyspnea, syncope, numbness/paresthesias, recent head injury, leg pain/swelling, hemoptysis, recent surgery/trauma, recent long travel, hormone use, personal hx of cancer, or hx of DVT/PE.   HPI  Past Medical History:  Diagnosis Date   Anxiety    Bipolar 1 disorder ([place])    Depression    Diverticulitis    Diverticulitis of colon 03/15/2016    Patient Active Problem List   Diagnosis Date Noted   Tubular adenoma 10/02/2018   Depressed bipolar I disorder in partial remission ([place]) 01/01/2018   Insomnia 01/01/2018   Anxiety state 01/01/2018   Tobacco abuse  06/25/2016   Diverticulitis of colon 03/15/2016   Bipolar disorder ([place]) 12/12/2013    Past Surgical History:  Procedure Laterality Date   COLONOSCOPY     oophrorectomy     TUBAL LIGATION       OB History    Gravida      Para      Term      Preterm      AB      Living  2     SAB      TAB      Ectopic      Multiple      Live Births               Home Medications    Prior to Admission medications   Medication Sig Start Date End Date Taking? Authorizing Provider  ALPRAZolam Duanne Moron) 0.5 MG tablet TAKE 1.5 TABLETS BY MOUTH EVERY MORNING AND 1 TABLET AT BEDTIME 12/06/18   Hurst, Teresa T, PA-C  buPROPion (WELLBUTRIN XL) 150 MG 24 hr tablet TAKE 3 TABLETS(450 MG) BY MOUTH DAILY 10/22/18   Adelene Idler, Teresa T, PA-C  busPIRone (BUSPAR) 15 MG tablet TAKE 1 TABLET BY MOUTH TWICE DAILY 11/08/18   Adelene Idler, Helene Kelp T, PA-C  dicyclomine (BENTYL) 20 MG tablet Take 1 tablet (20 mg total) by mouth 4 (four) times daily -  before meals and at bedtime. 05/01/18   Rolland Porter, MD  lamoTRIgine (LAMICTAL) 100 MG tablet Take 1.5  tablets (150 mg total) by mouth 2 (two) times daily. 08/18/18   Donnal Moat T, PA-C  ondansetron (ZOFRAN) 4 MG tablet Take 1 tablet (4 mg total) by mouth every 8 (eight) hours as needed for nausea or vomiting. 05/01/18   Rolland Porter, MD  traZODone (DESYREL) 100 MG tablet TAKE 1 TABLET BY MOUTH AT BEDTIME 12/06/18   Addison Lank, PA-C    Family History Family History  Problem Relation Age of Onset   Cancer Mother        lichen planus - oral cancer   Cancer Father        lung   Cancer Maternal Uncle        uterine   Diabetes Maternal Uncle    Stroke Maternal Grandmother    COPD Paternal Grandfather     Social History Social History   Tobacco Use   Smoking status: Former Smoker    Packs/day: 0.50    Types: Cigarettes    Quit date: 12/19/2017    Years since quitting: 0.9   Smokeless tobacco: Never Used   Tobacco comment: none in 9 months.  after smoking 1 pack a day x 20 yrs.  Substance Use Topics   Alcohol use: Yes    Alcohol/week: 1.0 standard drinks    Types: 1 Glasses of wine per week    Comment: week   Drug use: No     Allergies   Meperidine   Review of Systems Review of Systems  Constitutional: Negative for chills and fever.  Respiratory: Positive for chest tightness. Negative for cough and shortness of breath.   Cardiovascular: Positive for chest pain.  Gastrointestinal: Positive for nausea. Negative for abdominal pain, blood in stool, constipation, diarrhea and vomiting.  Neurological: Positive for weakness. Negative for dizziness, tremors, syncope, facial asymmetry, speech difficulty, light-headedness, numbness and headaches.  All other systems reviewed and are negative.    Physical Exam Updated Vital Signs BP (!) 134/113 (BP Location: Right Arm)    Pulse 73    Temp 98.1 F (36.7 C) (Oral)    Resp 20    Ht 5\' 7"  (1.702 m)    Wt 74.8 kg    SpO2 94%    BMI 25.84 kg/m   Physical Exam Vitals signs and nursing note reviewed.  Constitutional:      General: She is not in acute distress.    Appearance: She is well-developed. She is not toxic-appearing.  HENT:     Head: Normocephalic and atraumatic.  Eyes:     General:        Right eye: No discharge.        Left eye: No discharge.     Extraocular Movements: Extraocular movements intact.     Conjunctiva/sclera: Conjunctivae normal.     Pupils: Pupils are equal, round, and reactive to light.  Neck:     Musculoskeletal: Neck supple.  Cardiovascular:     Rate and Rhythm: Normal rate and regular rhythm.     Pulses:          Radial pulses are 2+ on the right side and 2+ on the left side.       Posterior tibial pulses are 2+ on the right side and 2+ on the left side.  Pulmonary:     Effort: Pulmonary effort is normal. No respiratory distress.     Breath sounds: Normal breath sounds. No wheezing, rhonchi or rales.  Abdominal:     General: There is no  distension.  Palpations: Abdomen is soft.     Tenderness: There is no abdominal tenderness.  Musculoskeletal:     Right lower leg: She exhibits no tenderness. No edema.     Left lower leg: She exhibits no tenderness. No edema.  Skin:    General: Skin is warm and dry.     Findings: No rash.  Neurological:     Mental Status: She is alert.     Comments: Alert. Clear speech. No facial droop. CNIII-XII grossly intact. Bilateral upper and lower extremities' sensation grossly intact. 5/5 symmetric strength with grip strength and with plantar and dorsi flexion bilaterally.. Normal finger to nose bilaterally. Negative pronator drift. Negative Romberg sign. Gait is steady and intact.   Psychiatric:        Mood and Affect: Mood is anxious.     Comments: Tearful.      ED Treatments / Results  Labs (all labs ordered are listed, but only abnormal results are displayed) Labs Reviewed  BASIC METABOLIC PANEL - Abnormal; Notable for the following components:      Result Value   Glucose, Bld 114 (*)    All other components within normal limits  CBC - Abnormal; Notable for the following components:   MCV 104.1 (*)    All other components within normal limits  TROPONIN I (HIGH SENSITIVITY)  TROPONIN I (HIGH SENSITIVITY)    EKG EKG Interpretation  Date/Time:  Monday December 11 2018 17:55:07 EDT Ventricular Rate:  79 PR Interval:  154 QRS Duration: 104 QT Interval:  382 QTC Calculation: 438 R Axis:   -24 Text Interpretation:  Normal sinus rhythm Low voltage QRS Incomplete right bundle branch block Cannot rule out Anterior infarct , age undetermined Abnormal ECG Confirmed by Gerlene Fee [phone]) on 12/11/2018 7:08:17 PM   Radiology Dg Chest 2 View  Result Date: 12/11/2018 CLINICAL DATA:  Chest pain, LEFT-sided chest pain, LEFT arm pain since this morning. EXAM: CHEST - 2 VIEW COMPARISON:  Chest x-ray dated 02/11/2009. FINDINGS: Heart size and mediastinal contours are within normal limits.  Lungs are clear. No pleural effusion or pneumothorax seen. No acute or suspicious osseous finding. IMPRESSION: No active cardiopulmonary disease. No evidence of pneumonia or pulmonary edema. Electronically Signed   By: Franki Cabot M.D.   On: 12/11/2018 19:34   Ct Head Wo Contrast  Result Date: 12/11/2018 CLINICAL DATA:  Generalized muscle weakness with left chest and arm pain since this morning. Nausea. EXAM: CT HEAD WITHOUT CONTRAST TECHNIQUE: Contiguous axial images were obtained from the base of the skull through the vertex without intravenous contrast. COMPARISON:  Report only from head CT 12/01/2015. FINDINGS: Brain: There is no evidence of acute intracranial hemorrhage, mass lesion, brain edema or extra-axial fluid collection. The ventricles and subarachnoid spaces are appropriately sized for age. There is no CT evidence of acute cortical infarction. Vascular:  No hyperdense vessel identified. Skull: Negative for fracture or focal lesion. Sinuses/Orbits: The visualized paranasal sinuses and mastoid air cells are clear. No orbital abnormalities are seen. Other: None. IMPRESSION: Unremarkable noncontrast head CT. Electronically Signed   By: Richardean Sale M.D.   On: 12/11/2018 20:43   Ct Angio Chest/abd/pel For Dissection W And/or W/wo  Result Date: 12/11/2018 CLINICAL DATA:  Left-sided chest pain and arm pain EXAM: CT ANGIOGRAPHY CHEST, ABDOMEN AND PELVIS TECHNIQUE: Multidetector CT imaging through the chest, abdomen and pelvis was performed using the standard protocol during bolus administration of intravenous contrast. Multiplanar reconstructed images and MIPs were obtained and reviewed to evaluate  the vascular anatomy. CONTRAST:  118mL OMNIPAQUE IOHEXOL 350 MG/ML SOLN COMPARISON:  May 01, 2018 FINDINGS: CTA CHEST FINDINGS Cardiovascular: --Heart: The heart size is normal.  There is nopericardial effusion. --Aorta: The course and caliber of the thoracic aorta are normal. There is no aortic  atherosclerotic calcification. Precontrast images show no aortic intramural hematoma. There is no blood pool, dissection or penetrating ulcer demonstrated on arterial phase postcontrast imaging. There is a conventional 3 vessel aortic arch branching pattern. The proximal arch vessels are widely patent. --Pulmonary Arteries: Contrast timing is optimized for preferential opacification of the aorta. Within that limitation, normal central pulmonary arteries. Mediastinum/Nodes: No mediastinal, hilar or axillary lymphadenopathy. The visualized thyroid and thoracic esophageal course are unremarkable. Lungs/Pleura: Calcified pulmonary nodule seen in the posterior right lower lung. Mild bibasilar dependent atelectasis is seen. No pleural effusion or pneumothorax. No focal airspace consolidation. No focal pleural abnormality. Musculoskeletal: No chest wall abnormality. No acute osseous findings. Review of the MIP images confirms the above findings. CTA ABDOMEN AND PELVIS FINDINGS VASCULAR Aorta:Normal caliber aorta without aneurysm, dissection, vasculitis or hemodynamically significant stenosis. There is mild aortic atherosclerosis. Celiac: No aneurysm, dissection or hemodynamically significant stenosis. Normal branching pattern SMA: Widely patent without dissection or stenosis. Renals: Single renal arteries bilaterally. No aneurysm, dissection, stenosis or evidence of fibromuscular dysplasia. IMA: Patent without abnormality. Inflow: No aneurysm, stenosis or dissection. Veins: Normal course and caliber of the major veins. Assessment is otherwise limited by the arterial dominant contrast phase. Review of the MIP images confirms the above findings. NON-VASCULAR Hepatobiliary: Normal hepatic contours and density. No visible biliary dilatation. Normal gallbladder. Pancreas: Normal contours without ductal dilatation. No peripancreatic fluid collection. Spleen: Normal arterial phase splenic enhancement pattern. Adrenals/Urinary  Tract: --Adrenal glands: Normal. --Right kidney/ureter: No hydronephrosis or perinephric stranding. No nephrolithiasis. No obstructing ureteral stones. --Left kidney/ureter: No hydronephrosis or perinephric stranding. No nephrolithiasis. No obstructing ureteral stones. --Urinary bladder: Unremarkable. Stomach/Bowel: --Stomach/Duodenum: There is a small hiatal hernia present. Normal duodenal course and caliber. --Small bowel: No dilatation or inflammation. --Colon: No focal abnormality. --Appendix: Normal. Lymphatic:  No abdominal or pelvic lymphadenopathy. Reproductive: No free fluid in the pelvis. Musculoskeletal. No bony spinal canal stenosis or focal osseous abnormality. Other: None. Review of the MIP images confirms the above findings. IMPRESSION: No acute aortic or vascular abnormality. No acute intrathoracic, abdominal, pelvic abnormality to explain the patient's symptoms. Electronically Signed   By: Prudencio Pair M.D.   On: 12/11/2018 20:59    Procedures Procedures (including critical care time)  Medications Ordered in ED Medications  sodium chloride flush (NS) 0.9 % injection 3 mL (has no administration in time range)     Initial Impression / Assessment and Plan / ED Course  I have reviewed the triage vital signs and the nursing notes.  Pertinent labs & imaging results that were available during my care of the patient were reviewed by me and considered in my medical decision making (see chart for details).   Patient presents to the emergency department with complaints of chest discomfort as well as left-sided weakness/tightness which has been occurring intermittently for the past week and became constant today.  Patient nontoxic-appearing, resting comfortably, vitals without significant abnormality.  Patient appears tearful and anxious otherwise her exam is benign.  No focal neurologic deficits.  DDX: ACS, PE, dissection, anemia, electrolyte derangement, anxiety, GERD, MSK.   CBC: No  leukocytosis or anemia BMP: No significant electrolyte derangement Troponin: > 2 x 2 EKG: No STEMI CXR: Negative CT head:  Negative CTA dissection study: Negative.   Patient with overall reassuring work-up in the emergency department. No significant elevation or change with her delta troponin, EKG no stemi- doubt ACS Low risk wells- no obvious PE on CTA dissection protocol CTA negative for dissection She mentions weakness sensation to the L side of her body which she has had before with her "sensory seizures"-- CT head negative for acute intracranial abnormality, no focal neuro deficits on exam- do not suspect head bleed or ischemic CVA.   Patient feeling much better with reassurance in the emergency department.  Unclear definitive etiology to her symptoms, she feels appropriate for discharge home at this time with cardiology, neurology, and PCP follow-up. I discussed results, treatment plan, need for follow-up, and return precautions with the patient. Provided opportunity for questions, patient confirmed understanding and is in agreement with plan.   This is a shared visit with supervising physician Dr. Sedonia Small who has independently evaluated patient & provided guidance in evaluation/management/disposition, in agreement with care   Final Clinical Impressions(s) / ED Diagnoses   Final diagnoses:  Chest discomfort    ED Discharge Orders    None       Leafy Kindle 12/11/18 2158    Maudie Flakes, MD 12/12/18 2246

## 2018-12-11 NOTE — Discharge Instructions (Addendum)
You were seen in the emergency department today for chest pain. Your work-up in the emergency department has been overall reassuring. Your labs have been fairly normal and or similar to previous blood work you have had done. Your EKG and the enzyme we use to check your heart did not show an acute heart attack at this time. Your chest x-ray was normal.  The CT scan of your head as well as of your chest, abdomen, and pelvis were all reassuring and normal.  We would like you to follow up closely with your primary care provider as well as the cardiologist and neurologist provided in your discharge instructions within 1-3 days. Return to the ER immediately should you experience any new or worsening symptoms including but not limited to return of pain, worsened pain, vomiting, shortness of breath, dizziness, lightheadedness, passing out, or any other concerns that you may have.    [facility] Primary Care Doctor List    Sinda Du MD. Specialty: Pulmonary Disease Contact information: [place]  [place] [address]  [phone]   Tula Nakayama, MD. Specialty: [facility] Medicine Contact information: [address] [place] [address]  [phone]   Sallee Lange, MD. Specialty: [facility] Medicine Contact information: [place]  [place] [address]  [phone]   Rosita Fire, MD Specialty: Internal Medicine Contact information: [place] [address]  [phone]   Delphina Cahill, MD. Specialty: Internal Medicine Contact information: [place] [address]  [phone]    [facility] (Dr. Maudie Mercury) Specialty: Family Medicine Contact information: [place] [address]  [phone]   Leslie Andrea, MD. Specialty: [facility] Medicine Contact information: [place] [address]  [phone]   Asencion Noble, MD. Specialty: Internal Medicine Contact  information: [place] [address]  [place] [address]  [place]  [address] [place], [place] [address] [phone]  Services [facility] offers a variety of basic health services.  Services include but are not limited to: Blood pressure checks  Heart rate checks  Blood sugar checks  Urine analysis  Rapid strep tests  Pregnancy tests.  Health education and referrals  People needing more complex services will be directed to a physician online. Using these virtual visits, doctors can evaluate and prescribe medicine and treatments. There will be no medication on-site, though [facility] will help patients fill their prescriptions at little to no cost.   For More information please go to: [email or web address]

## 2018-12-11 NOTE — ED Notes (Signed)
Pt very anxious and tearful at this time.

## 2019-01-12 ENCOUNTER — Other Ambulatory Visit: Payer: Self-pay

## 2019-01-12 ENCOUNTER — Telehealth: Payer: Self-pay | Admitting: Physician Assistant

## 2019-01-12 MED ORDER — ALPRAZOLAM 1 MG PO TABS: 1.0000 mg | ORAL_TABLET | Freq: Two times a day (BID) | ORAL | 0 refills | Status: DC | PRN

## 2019-01-12 NOTE — Telephone Encounter (Signed)
Pt called to report [facility] in [place] do not have any Xanax. She has called [facility] location [place] [phone] which is where she is headed. Only problem is they do not have 0.5 mg. They have 1 mg and lower dose. Please send to this location with orders for one of these doses. Any ? Call [phone]

## 2019-01-12 NOTE — Telephone Encounter (Signed)
She is scheduled for 12/3

## 2019-01-12 NOTE — Telephone Encounter (Signed)
Called patient's requested [facility] in [place], called in Xanax 1 mg tablets #60 1 tablet bid instead due to 0.5 mg unavailable. They will get that filled for her. Last refill #75 on 12/04/2018

## 2019-01-12 NOTE — Telephone Encounter (Signed)
Clarification address is [employer], [phone]

## 2019-01-15 NOTE — Telephone Encounter (Signed)
Thanks Traci.

## 2019-02-02 ENCOUNTER — Other Ambulatory Visit: Payer: Self-pay | Admitting: Physician Assistant

## 2019-02-04 ENCOUNTER — Other Ambulatory Visit: Payer: Self-pay | Admitting: Physician Assistant

## 2019-02-08 ENCOUNTER — Ambulatory Visit (INDEPENDENT_AMBULATORY_CARE_PROVIDER_SITE_OTHER): Payer: BC Managed Care – PPO | Admitting: Physician Assistant

## 2019-02-08 ENCOUNTER — Other Ambulatory Visit: Payer: Self-pay

## 2019-02-08 ENCOUNTER — Encounter: Payer: Self-pay | Admitting: Physician Assistant

## 2019-02-08 DIAGNOSIS — G4700 Insomnia, unspecified: Secondary | ICD-10-CM | POA: Diagnosis not present

## 2019-02-08 DIAGNOSIS — F3175 Bipolar disorder, in partial remission, most recent episode depressed: Secondary | ICD-10-CM

## 2019-02-08 DIAGNOSIS — F411 Generalized anxiety disorder: Secondary | ICD-10-CM | POA: Diagnosis not present

## 2019-02-08 MED ORDER — LAMOTRIGINE 100 MG PO TABS: ORAL_TABLET | ORAL | 1 refills | Status: DC

## 2019-02-08 MED ORDER — ALPRAZOLAM 1 MG PO TABS: 1.0000 mg | ORAL_TABLET | Freq: Two times a day (BID) | ORAL | 1 refills | Status: DC | PRN

## 2019-02-08 NOTE — Progress Notes (Signed)
[facility] Med Check  Patient ID: ERIOLUWA VENTURINO,  MRN: [id number]  PCP: Orpah Melter, MD  Date of Evaluation: 02/08/2019 Time spent:15 minutes  Chief Complaint:  Chief Complaint    Anxiety; Follow-up      HISTORY/CURRENT STATUS: HPI For routine med check.  Has been more anxious. We had to change the Xanax dose d/t not being available at the pharmacy. States she has a lot more worries w/ finances, her kids, her jobs.  She had been trying, and doing a very good job, of weaning down off of the Xanax over the course of the past 6 to 12 months.  However recently, she is needed to increase back to 0.5 mg usually twice daily but a few times a week she has had to take that dosage 3 times a day.  Has trouble going to sleep sometimes because of racing thoughts.  The trazodone is effective though.  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  If she is not doing something like that, she wants to stay home a lot.  She has been crying easier than normal.  No known reason.  Hygiene is normal, appetite is normal no weight gain or weight loss.  She has a hard time enjoying things.  No suicidal or homicidal thoughts.  Patient denies increased energy with decreased need for sleep, no increased talkativeness, no racing thoughts, no impulsivity or risky behaviors, no increased spending, no increased libido, no grandiosity.  Denies dizziness, syncope, seizures, numbness, tingling, tremor, tics, unsteady gait, slurred speech, confusion. Denies muscle or joint pain, stiffness, or dystonia.  Individual Medical History/ Review of Systems: Changes? Cathren Laine to the ER a few months ago with chest pain, negative work-up.  Probably related to anxiety.  Also she had been out of trazodone for a few days and she is uncertain if that was part of the cause of the discomfort or not.  Past medications for mental health diagnoses include: Effexor, Ativan, Paxil, Lithobid, Xanax,  Lamictal, trazodone, Saphris, Zoloft, Seroquel, BuSpar  Allergies: Meperidine  Current Medications:  Current Outpatient Medications:  .  ALPRAZolam (XANAX) 1 MG tablet, Take 1 tablet (1 mg total) by mouth 2 (two) times daily as needed for anxiety., Disp: 60 tablet, Rfl: 1 .  buPROPion (WELLBUTRIN XL) 150 MG 24 hr tablet, TAKE 3 TABLETS(450 MG) BY MOUTH DAILY, Disp: 270 tablet, Rfl: 0 .  busPIRone (BUSPAR) 15 MG tablet, TAKE 1 TABLET BY MOUTH TWICE DAILY, Disp: 180 tablet, Rfl: 0 .  lamoTRIgine (LAMICTAL) 100 MG tablet, 2 po q am, and 1.5 pills po qhs., Disp: 315 tablet, Rfl: 1 .  ondansetron (ZOFRAN) 4 MG tablet, Take 1 tablet (4 mg total) by mouth every 8 (eight) hours as needed for nausea or vomiting., Disp: 10 tablet, Rfl: 0 .  traZODone (DESYREL) 100 MG tablet, TAKE 1 TABLET BY MOUTH AT BEDTIME, Disp: 30 tablet, Rfl: 1 .  dicyclomine (BENTYL) 20 MG tablet, Take 1 tablet (20 mg total) by mouth 4 (four) times daily -  before meals and at bedtime. (Patient not taking: Reported on 02/08/2019), Disp: 40 tablet, Rfl: 0 Medication Side Effects: none  Family Medical/ Social History: Changes? No  MENTAL HEALTH EXAM:  There were no vitals taken for this visit.There is no height or weight on file to calculate BMI.  General Appearance: Casual, Neat and Well Groomed  Eye Contact:  Good  Speech:  Clear and Coherent  Volume:  Normal  Mood:  Anxious and  Depressed  Affect:  Tearful  Thought Process:  Goal Directed  Orientation:  Full (Time, Place, and Person)  Thought Content: Logical   Suicidal Thoughts:  No  Homicidal Thoughts:  No  Memory:  WNL  Judgement:  Good  Insight:  Good  Psychomotor Activity:  Normal  Concentration:  Concentration: Good  Recall:  Good  Fund of Knowledge: Good  Language: Good  Assets:  Desire for Improvement  ADL's:  Intact  Cognition: WNL  Prognosis:  Good    DIAGNOSES:    ICD-10-CM   1. Depressed bipolar I disorder in partial remission (HCC)  F31.75   2.  Generalized anxiety disorder  F41.1   3. Insomnia, unspecified type  G47.00     Receiving Psychotherapy: No    RECOMMENDATIONS:  Increase Lamictal to 100 mg, 2 p.o. every morning and 1.5 pills nightly. Continue Xanax 1 mg (that is what she has been able to get at the pharmacy), one half p.o. twice daily as needed with an occasional extra one half during the day as needed. Continue Wellbutrin XL 150 mg, 3 p.o. daily. Continue BuSpar 15 mg 1 twice daily. Continue trazodone 100 mg nightly as needed sleep. Referred to counseling with Sammuel Cooper, LCSW Return in 4 to 6 weeks.  Donnal Moat, PA-C

## 2019-02-19 ENCOUNTER — Other Ambulatory Visit: Payer: Self-pay

## 2019-02-19 ENCOUNTER — Ambulatory Visit (INDEPENDENT_AMBULATORY_CARE_PROVIDER_SITE_OTHER): Payer: BC Managed Care – PPO | Admitting: Addiction (Substance Use Disorder)

## 2019-02-19 DIAGNOSIS — F3175 Bipolar disorder, in partial remission, most recent episode depressed: Secondary | ICD-10-CM | POA: Diagnosis not present

## 2019-02-19 DIAGNOSIS — F411 Generalized anxiety disorder: Secondary | ICD-10-CM
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
## 2019-03-15 ENCOUNTER — Encounter: Payer: Self-pay | Admitting: Internal Medicine

## 2019-03-15 ENCOUNTER — Ambulatory Visit (INDEPENDENT_AMBULATORY_CARE_PROVIDER_SITE_OTHER): Payer: BC Managed Care – PPO | Admitting: Internal Medicine

## 2019-03-15 ENCOUNTER — Other Ambulatory Visit: Payer: Self-pay

## 2019-03-15 VITALS — BP 95/73 | HR 55 | Temp 95.3°F | Ht 67.0 in | Wt 172.4 lb

## 2019-03-15 DIAGNOSIS — R0602 Shortness of breath: Secondary | ICD-10-CM

## 2019-03-15 NOTE — Progress Notes (Signed)
Cardiology Office Note   Date:  03/15/2019   ID:  [name], DOB 1955/04/06, MRN [id number]  PCP:  Orpah Melter, MD  Cardiologist:   Dorris Carnes, MD    Pt referred for chest heaviness by Dr Sedonia Small    History of Present Illness: [name] is a 64 y.o. female with a history of tobacco abuse, bipolar disorder, anxiety   She was seen in [facility] ED in October with CP   Pain constant   L chest.   On day she went to ED was L upper arm    Pt reported hx of seizures due to head injury  Affecting L side of body and face    Pt notes heaviness in chest that comes and goes   Feels like can't get good breath   Not as much when doesn't have mask on   Has hx of panic   Worries   Does not think it's that   Yesterday had a heaviness in chest when cleaning house   Episodes can occur with an without activity   Feels like can't get a good breath  No wheezing   Current Meds  Medication Sig  . ALPRAZolam (XANAX) 1 MG tablet Take 1 tablet (1 mg total) by mouth 2 (two) times daily as needed for anxiety.  Marland Kitchen buPROPion (WELLBUTRIN XL) 150 MG 24 hr tablet TAKE 3 TABLETS(450 MG) BY MOUTH DAILY  . busPIRone (BUSPAR) 15 MG tablet TAKE 1 TABLET BY MOUTH TWICE DAILY  . lamoTRIgine (LAMICTAL) 100 MG tablet 2 po q am, and 1.5 pills po qhs.  . traZODone (DESYREL) 100 MG tablet TAKE 1 TABLET BY MOUTH AT BEDTIME     Allergies:   Meperidine   Past Medical History:  Diagnosis Date  . Anxiety   . Bipolar 1 disorder ([place])   . Depression   . Diverticulitis   . Diverticulitis of colon 03/15/2016    Past Surgical History:  Procedure Laterality Date  . COLONOSCOPY    . oophrorectomy    . TUBAL LIGATION       Social History:  The patient  reports that she quit smoking about 14 months ago. Her smoking use included cigarettes. She smoked 0.50 packs per day. She has never used smokeless tobacco. She reports current alcohol use of about 1.0 standard drinks of alcohol per week. She reports that she does  not use drugs.   Family History:  The patient's family history includes COPD in her paternal grandfather; Cancer in her father, maternal uncle, and mother; Diabetes in her maternal uncle; Stroke in her maternal grandmother.    ROS:  Please see the history of present illness. All other systems are reviewed and  Negative to the above problem except as noted.    PHYSICAL EXAM: VS:  BP 95/73   Pulse (!) 55   Temp (!) 95.3 F (35.2 C)   Ht 5\' 7"  (1.702 m)   Wt 172 lb 6.4 oz (78.2 kg)   SpO2 100%   BMI 27.00 kg/m   GEN: Well nourished, well developed, in no acute distress  HEENT: normal  Neck: no JVD, carotid bruits, or masses Cardiac: RRR; no murmurs, rubs, or gallops,no edema  Respiratory:  clear to auscultation bilaterally, normal work of breathing GI: soft, nontender, nondistended, + BS  No hepatomegaly  MS: no deformity Moving all extremities   Skin: warm and dry, no rash Neuro:  Strength and sensation are intact Psych: euthymic mood, full affect  EKG:  EKG is not ordered today.  On Dec 11 2018;   SR   75   Incomp RBBB   Lipid Panel No results found for: CHOL, TRIG, HDL, CHOLHDL, VLDL, LDLCALC, LDLDIRECT    Wt Readings from Last 3 Encounters:  03/15/19 172 lb 6.4 oz (78.2 kg)  12/11/18 165 lb (74.8 kg)  10/02/18 165 lb 3.2 oz (74.9 kg)      ASSESSMENT AND PLAN:  1  Chest heaviness/ dyspnea.   Exam is normal   CT shows no coronary calcifications   No signif aortic atherosclerosis I am not sure what symptoms represent   I am not convinced that it is angina   Not pulmonary    Will set up for an echo to eval diastolic function Encoruaged her to stay hydrated.   BP is low   If went lower it could exacerbate any symptoms     2  Hx tob   Congratulated on smoking cessation    Current medicines are reviewed at length with the patient today.  The patient does not have concerns regarding medicines.  Signed, Dorris Carnes, MD  03/15/2019 10:55 AM    [facility]  [facility] [address], [place], [place]  [address] Phone: [phone]; Fax: [phone]

## 2019-03-15 NOTE — Patient Instructions (Signed)
Medication Instructions:  Your physician recommends that you continue on your current medications as directed. Please refer to the Current Medication list given to you today.  *If you need a refill on your cardiac medications before your next appointment, please call your pharmacy*  Lab Work: NONE   If you have labs (blood work) drawn today and your tests are completely normal, you will receive your results only by: Marland Kitchen MyChart Message (if you have MyChart) OR . A paper copy in the mail If you have any lab test that is abnormal or we need to change your treatment, we will call you to review the results.  Testing/Procedures: Your physician has requested that you have an echocardiogram. Echocardiography is a painless test that uses sound waves to create images of your heart. It provides your doctor with information about the size and shape of your heart and how well your heart's chambers and valves are working. This procedure takes approximately one hour. There are no restrictions for this procedure.    Follow-Up: At [facility], you and your health needs are our priority.  As part of our continuing mission to provide you with exceptional heart care, we have created designated Provider Care Teams.  These Care Teams include your primary Cardiologist (physician) and Advanced Practice Providers (APPs -  Physician Assistants and Nurse Practitioners) who all work together to provide you with the care you need, when you need it.  Your next appointment:    As Needed   The format for your next appointment:   Either In Person or Virtual  Provider:   You may see No primary care provider on file. or one of the following Advanced Practice Providers on your designated Care Team:    Bernerd Pho, PA-C   Ermalinda Barrios, [place]    Other Instructions Thank you for choosing [facility]!

## 2019-03-16 ENCOUNTER — Ambulatory Visit (HOSPITAL_COMMUNITY)
Admission: RE | Admit: 2019-03-16 | Discharge: 2019-03-16 | Disposition: A | Discharge status: Home / Self Care | Payer: BC Managed Care – PPO | Source: Ambulatory Visit | Attending: Internal Medicine | Admitting: Internal Medicine

## 2019-03-16 DIAGNOSIS — R0602 Shortness of breath: Secondary | ICD-10-CM

## 2019-03-16 NOTE — Progress Notes (Signed)
*  PRELIMINARY RESULTS* Echocardiogram 2D Echocardiogram has been performed.  Samuel Germany 03/16/2019, 2:52 PM

## 2019-03-19 ENCOUNTER — Ambulatory Visit: Payer: BC Managed Care – PPO | Admitting: Physician Assistant

## 2019-03-19 ENCOUNTER — Ambulatory Visit: Payer: BC Managed Care – PPO | Admitting: Addiction (Substance Use Disorder)

## 2019-03-21 ENCOUNTER — Telehealth: Payer: Self-pay | Admitting: *Deleted

## 2019-03-21 NOTE — Telephone Encounter (Signed)
Called patient with test results. No answer. Left message to call back.  

## 2019-03-21 NOTE — Telephone Encounter (Signed)
-----   Message from Fay Records, MD sent at 03/19/2019 10:12 PM EST ----- Normal pumping function of heart   Mild diastolic dysfunciton   I am not convincned explained symptoms WOuld set up for myovue to rule out ischemia

## 2019-03-22 ENCOUNTER — Encounter: Payer: Self-pay | Admitting: *Deleted

## 2019-03-22 ENCOUNTER — Telehealth: Payer: Self-pay | Admitting: Internal Medicine

## 2019-03-22 ENCOUNTER — Telehealth: Payer: Self-pay | Admitting: *Deleted

## 2019-03-22 DIAGNOSIS — R0602 Shortness of breath: Secondary | ICD-10-CM

## 2019-03-22 NOTE — Telephone Encounter (Signed)
-----   Message from Fay Records, MD sent at 03/19/2019 10:12 PM EST ----- Normal pumping function of heart   Mild diastolic dysfunciton   I am not convincned explained symptoms WOuld set up for myovue to rule out ischemia

## 2019-03-22 NOTE — Telephone Encounter (Signed)
Error

## 2019-03-22 NOTE — Telephone Encounter (Signed)
Pt would like for Dr. Harrington Challenger to give her a call

## 2019-03-22 NOTE — Telephone Encounter (Signed)
Pt notified, order placed.

## 2019-03-30 ENCOUNTER — Other Ambulatory Visit: Payer: Self-pay

## 2019-03-30 ENCOUNTER — Encounter (HOSPITAL_COMMUNITY): Payer: Self-pay

## 2019-03-30 ENCOUNTER — Other Ambulatory Visit: Payer: Self-pay | Admitting: *Deleted

## 2019-03-30 ENCOUNTER — Encounter (HOSPITAL_COMMUNITY)
Admission: RE | Admit: 2019-03-30 | Discharge: 2019-03-30 | Disposition: A | Discharge status: Home / Self Care | Payer: BC Managed Care – PPO | Source: Ambulatory Visit | Attending: Internal Medicine | Admitting: Internal Medicine

## 2019-03-30 ENCOUNTER — Encounter (HOSPITAL_BASED_OUTPATIENT_CLINIC_OR_DEPARTMENT_OTHER)
Admission: RE | Admit: 2019-03-30 | Discharge: 2019-03-30 | Disposition: A | Discharge status: Home / Self Care | Payer: BC Managed Care – PPO | Source: Ambulatory Visit | Attending: Internal Medicine | Admitting: Internal Medicine

## 2019-03-30 DIAGNOSIS — R0602 Shortness of breath: Secondary | ICD-10-CM | POA: Insufficient documentation

## 2019-03-30 LAB — NM MYOCAR MULTI W/SPECT W/WALL MOTION / EF
LV dias vol: 45 mL (ref 46–106)
LV sys vol: 10 mL
Peak HR: 114 {beats}/min
RATE: 0.7
Rest HR: 73 {beats}/min
SDS: 1
SRS: 3
SSS: 1
TID: 0.92

## 2019-03-30 MED ORDER — SODIUM CHLORIDE FLUSH 0.9 % IV SOLN
INTRAVENOUS | Status: AC
  Administered 2019-03-30: 10 mL via INTRAVENOUS
  Filled 2019-03-30: qty 10

## 2019-03-30 MED ORDER — TECHNETIUM TC 99M TETROFOSMIN IV KIT
10.0000 | PACK | Freq: Once | INTRAVENOUS | Status: AC | PRN
  Administered 2019-03-30: 10.45 via INTRAVENOUS

## 2019-03-30 MED ORDER — REGADENOSON 0.4 MG/5ML IV SOLN
INTRAVENOUS | Status: AC
  Administered 2019-03-30: 0.4 mg via INTRAVENOUS
  Filled 2019-03-30: qty 5

## 2019-03-30 MED ORDER — TECHNETIUM TC 99M TETROFOSMIN IV KIT
30.0000 | PACK | Freq: Once | INTRAVENOUS | Status: AC | PRN
  Administered 2019-03-30: 29 via INTRAVENOUS

## 2019-04-01 NOTE — Telephone Encounter (Signed)
Left msg on VM that myovue was normal TOld her I was in clinic in [place] if she hadquestions to call Echo and stress test both look good

## 2019-04-03 ENCOUNTER — Ambulatory Visit (INDEPENDENT_AMBULATORY_CARE_PROVIDER_SITE_OTHER): Payer: BC Managed Care – PPO | Admitting: Addiction (Substance Use Disorder)

## 2019-04-03 DIAGNOSIS — F4310 Post-traumatic stress disorder, unspecified: Secondary | ICD-10-CM | POA: Diagnosis not present

## 2019-04-03 DIAGNOSIS — F3175 Bipolar disorder, in partial remission, most recent episode depressed: Secondary | ICD-10-CM | POA: Diagnosis not present
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
## 2019-04-06 ENCOUNTER — Other Ambulatory Visit: Payer: Self-pay | Admitting: Physician Assistant

## 2019-04-11 ENCOUNTER — Other Ambulatory Visit: Payer: Self-pay | Admitting: Psychiatry

## 2019-04-12 ENCOUNTER — Other Ambulatory Visit: Payer: Self-pay | Admitting: Physician Assistant

## 2019-04-18 ENCOUNTER — Ambulatory Visit: Payer: BC Managed Care – PPO | Admitting: Physician Assistant

## 2019-05-03 ENCOUNTER — Ambulatory Visit (INDEPENDENT_AMBULATORY_CARE_PROVIDER_SITE_OTHER): Payer: BC Managed Care – PPO | Admitting: Nurse Practitioner

## 2019-05-04 ENCOUNTER — Other Ambulatory Visit: Payer: Self-pay | Admitting: Physician Assistant

## 2019-06-21 ENCOUNTER — Other Ambulatory Visit: Payer: Self-pay | Admitting: Physician Assistant

## 2019-06-22 ENCOUNTER — Ambulatory Visit (INDEPENDENT_AMBULATORY_CARE_PROVIDER_SITE_OTHER): Payer: BC Managed Care – PPO | Admitting: Physician Assistant

## 2019-06-22 ENCOUNTER — Encounter: Payer: Self-pay | Admitting: Physician Assistant

## 2019-06-22 DIAGNOSIS — F411 Generalized anxiety disorder: Secondary | ICD-10-CM | POA: Diagnosis not present

## 2019-06-22 DIAGNOSIS — F319 Bipolar disorder, unspecified: Secondary | ICD-10-CM | POA: Diagnosis not present

## 2019-06-22 DIAGNOSIS — G4700 Insomnia, unspecified: Secondary | ICD-10-CM

## 2019-06-22 MED ORDER — TRAZODONE HCL 100 MG PO TABS: ORAL_TABLET | ORAL | 1 refills | Status: DC

## 2019-06-22 MED ORDER — ALPRAZOLAM 0.5 MG PO TABS: 0.5000 mg | ORAL_TABLET | Freq: Two times a day (BID) | ORAL | 5 refills | Status: DC | PRN

## 2019-06-22 MED ORDER — BUPROPION HCL ER (XL) 150 MG PO TB24: ORAL_TABLET | ORAL | 1 refills | Status: DC

## 2019-06-22 NOTE — Progress Notes (Signed)
[facility] Med Check  Patient ID: [name],  MRN: [id number]  PCP: Orpah Melter, MD  Date of Evaluation: 06/22/2019 Time spent:20 minutes  Chief Complaint:  Chief Complaint    Anxiety; Depression; Insomnia     Virtual Visit via Telephone Note  I connected with patient by a video enabled telemedicine application or telephone, with their informed consent, and verified patient privacy and that I am speaking with the correct person using two identifiers.  I am private, in my office and the patient is home.  I discussed the limitations, risks, security and privacy concerns of performing an evaluation and management service by telephone and the availability of in person appointments. I also discussed with the patient that there may be a patient responsible charge related to this service. The patient expressed understanding and agreed to proceed.   I discussed the assessment and treatment plan with the patient. The patient was provided an opportunity to ask questions and all were answered. The patient agreed with the plan and demonstrated an understanding of the instructions.   The patient was advised to call back or seek an in-person evaluation if the symptoms worsen or if the condition fails to improve as anticipated.  I provided 20 minutes of non-face-to-face time during this encounter.  HISTORY/CURRENT STATUS: HPI for medication refills.  Patient states she is doing really well.  She continues to work cleaning houses.  She is able to enjoy things.  Her energy and motivation are good.  She is not isolating.  Not crying easily.  Denies suicidal or homicidal thoughts.  Her anxiety is much better controlled.  She continues to wean the Xanax.  She would really like to get completely off of it.  I am not sure that she will be able to but even if not, I have reassured her that her current dose is pretty low and if she has to take it for better quality of life, it is okay.  She  sleeps well most of the time but she has to have the trazodone.  If she does not take it, she cannot sleep.  Denies dizziness, syncope, seizures, numbness, tingling, tremor, tics, unsteady gait, slurred speech, confusion. Denies muscle or joint pain, stiffness, or dystonia.  Individual Medical History/ Review of Systems: Changes? :Yes  had some chest pain since LOV, had EKG and Stress Test, normal.   Past medications for mental health diagnoses include: Effexor, Ativan, Paxil, Lithobid, Xanax, Lamictal, trazodone, Saphris, Zoloft, Seroquel, BuSpar  Allergies: Meperidine  Current Medications:  Current Outpatient Medications:  .  buPROPion (WELLBUTRIN XL) 150 MG 24 hr tablet, TAKE 3 TABLETS(450 MG) BY MOUTH DAILY, Disp: 270 tablet, Rfl: 1 .  busPIRone (BUSPAR) 15 MG tablet, TAKE 1 TABLET BY MOUTH TWICE DAILY, Disp: 180 tablet, Rfl: 0 .  lamoTRIgine (LAMICTAL) 100 MG tablet, 2 po q am, and 1.5 pills po qhs. (Patient taking differently: 150 mg. 2 po q am, and 1.5 pills po qhs.), Disp: 315 tablet, Rfl: 1 .  traZODone (DESYREL) 100 MG tablet, TAKE 1 TABLET(100 MG) BY MOUTH AT BEDTIME, Disp: 90 tablet, Rfl: 1 .  ALPRAZolam (XANAX) 0.5 MG tablet, Take 1 tablet (0.5 mg total) by mouth 2 (two) times daily as needed for anxiety., Disp: 60 tablet, Rfl: 5 .  dicyclomine (BENTYL) 20 MG tablet, Take 1 tablet (20 mg total) by mouth 4 (four) times daily -  before meals and at bedtime. (Patient not taking: Reported on 02/08/2019), Disp: 40 tablet, Rfl: 0 Medication Side  Effects: none  Family Medical/ Social History: Changes? Yes is dating a new guy MENTAL HEALTH EXAM:  There were no vitals taken for this visit.There is no height or weight on file to calculate BMI.  General Appearance: unable to assess  Eye Contact:  unable to assess  Speech:  Clear and Coherent and Normal Rate  Volume:  Normal  Mood:  Euthymic  Affect:  unable to assess  Thought Process:  Goal Directed and Descriptions of Associations:  Intact  Orientation:  Full (Time, Place, and Person)  Thought Content: Logical   Suicidal Thoughts:  No  Homicidal Thoughts:  No  Memory:  WNL  Judgement:  Good  Insight:  Good  Psychomotor Activity:  unable to assess  Concentration:  Concentration: Good  Recall:  Good  Fund of Knowledge: Good  Language: Good  Assets:  Desire for Improvement  ADL's:  Intact  Cognition: WNL  Prognosis:  Good    DIAGNOSES:    ICD-10-CM   1. Bipolar I disorder ([place])  F31.9   2. Generalized anxiety disorder  F41.1   3. Insomnia, unspecified type  G47.00     Receiving Psychotherapy: Yes  Sammuel Cooper, LCSW   RECOMMENDATIONS:  PDMP was reviewed. I spent 20 minutes with her. I am glad to see her doing so well! Continue Xanax 0.5 mg, 1 p.o. twice daily as needed. Continue Wellbutrin XL 150 mg, 3 p.o. daily. Continue BuSpar 15 mg p.o. twice daily. Continue Lamictal 100 mg, 1.5 pills twice daily. Continue trazodone 100 mg, 1 p.o. nightly. Continue counseling. Return in 6 months.  Donnal Moat, PA-C

## 2019-07-11 ENCOUNTER — Other Ambulatory Visit: Payer: Self-pay | Admitting: Physician Assistant

## 2019-07-16 ENCOUNTER — Ambulatory Visit (INDEPENDENT_AMBULATORY_CARE_PROVIDER_SITE_OTHER): Payer: BC Managed Care – PPO | Admitting: Gastroenterology

## 2019-07-16 ENCOUNTER — Other Ambulatory Visit: Payer: Self-pay | Admitting: Physician Assistant

## 2019-07-30 ENCOUNTER — Ambulatory Visit (INDEPENDENT_AMBULATORY_CARE_PROVIDER_SITE_OTHER): Payer: BC Managed Care – PPO | Admitting: Gastroenterology

## 2019-07-31 ENCOUNTER — Ambulatory Visit (INDEPENDENT_AMBULATORY_CARE_PROVIDER_SITE_OTHER): Payer: BC Managed Care – PPO | Admitting: Gastroenterology

## 2019-07-31 ENCOUNTER — Encounter (INDEPENDENT_AMBULATORY_CARE_PROVIDER_SITE_OTHER): Payer: Self-pay | Admitting: *Deleted

## 2019-07-31 ENCOUNTER — Encounter (INDEPENDENT_AMBULATORY_CARE_PROVIDER_SITE_OTHER): Payer: Self-pay | Admitting: Gastroenterology

## 2019-07-31 ENCOUNTER — Telehealth (INDEPENDENT_AMBULATORY_CARE_PROVIDER_SITE_OTHER): Payer: Self-pay | Admitting: *Deleted

## 2019-07-31 ENCOUNTER — Other Ambulatory Visit: Payer: Self-pay

## 2019-07-31 ENCOUNTER — Other Ambulatory Visit (INDEPENDENT_AMBULATORY_CARE_PROVIDER_SITE_OTHER): Payer: Self-pay | Admitting: *Deleted

## 2019-07-31 VITALS — BP 92/63 | HR 69 | Temp 97.1°F | Ht 67.0 in | Wt 171.5 lb

## 2019-07-31 DIAGNOSIS — Z86010 Personal history of colonic polyps: Secondary | ICD-10-CM

## 2019-07-31 DIAGNOSIS — R1030 Lower abdominal pain, unspecified: Secondary | ICD-10-CM | POA: Diagnosis not present

## 2019-07-31 DIAGNOSIS — K5732 Diverticulitis of large intestine without perforation or abscess without bleeding: Secondary | ICD-10-CM | POA: Diagnosis not present

## 2019-07-31 MED ORDER — HYOSCYAMINE SULFATE 0.125 MG SL SUBL: 0.1250 mg | SUBLINGUAL_TABLET | Freq: Four times a day (QID) | SUBLINGUAL | 0 refills | Status: DC | PRN

## 2019-07-31 MED ORDER — ONDANSETRON HCL 4 MG PO TABS: 4.0000 mg | ORAL_TABLET | Freq: Three times a day (TID) | ORAL | 1 refills | Status: DC | PRN

## 2019-07-31 MED ORDER — SUTAB 1479-225-188 MG PO TABS: 1.0000 | ORAL_TABLET | Freq: Once | ORAL | 0 refills | Status: AC

## 2019-07-31 NOTE — Progress Notes (Signed)
Patient profile: [name] is a 64 y.o. female seen for evaluation of diverticulitis. Last seen in clinic on 09/2018  History of Present Illness: [name] is seen today for diverticulitis.  She reports since February 2020 she has had intermittent issues with abdominal pain and swelling. Episodes at this point are occurring about every other month.  Develops abdominal pain and stomach swelling which is more prominent on her left side.  Will sometimes improve with laying still. Episodes do not seem to related to bowel movements.  Episodes can last from hours to 1 or 2 days.  She has noticed overeating or foods such as seeds and popcorn can trigger episodes.  She has had 2 CTs during the episodes that have been negative for diverticulitis.  She does develop nausea with episodes and has used Zofran on as-needed basis which helps her symptoms.  She denies any rectal bleeding.  Her bowels are normally regular & she takes pre and probiotics and has a stool daily.  She denies any blood in the stool  Chronic occasional GERD that resolves with Tums.  Denies any dysphagia. No epigastric pain. Nausea/vomiting only w/ episodes.   She has not had any antibiotics for diverticulitis since she was hospitalized at [facility] last summer.   Wt Readings from Last 3 Encounters:  07/31/19 171 lb 8 oz (77.8 kg)  03/15/19 172 lb 6.4 oz (78.2 kg)  12/11/18 165 lb (74.8 kg)     Last Colonoscopy: FINAL DIAGNOSES: 2012 (done for hx of TA) 1. Normal terminal ileum.  2. Mild left-sided diverticulosis.  3. Normal colonic mucosa. Biopsies taken from sigmoid colon looking  for microscopic and/or collagenous colitis.  Biopsy: lymphocytic colitis.   Last Endoscopy: None prior   Past Medical History:  Past Medical History:  Diagnosis Date  . Anxiety   . Bipolar 1 disorder ([place])   . Depression   . Diverticulitis   . Diverticulitis of colon 03/15/2016    Problem List: Patient Active Problem List   Diagnosis Date Noted  . Tubular adenoma 10/02/2018  . Depressed bipolar I disorder in partial remission ([place]) 01/01/2018  . Insomnia 01/01/2018  . Anxiety state 01/01/2018  . Tobacco abuse 06/25/2016  . Diverticulitis of colon 03/15/2016  . Bipolar disorder ([place]) 12/12/2013    Past Surgical History: Past Surgical History:  Procedure Laterality Date  . COLONOSCOPY    . oophrorectomy    . TUBAL LIGATION      Allergies: Allergies  Allergen Reactions  . Meperidine Hives      Home Medications:  Current Outpatient Medications:  .  ALPRAZolam (XANAX) 0.5 MG tablet, Take 1 tablet (0.5 mg total) by mouth 2 (two) times daily as needed for anxiety., Disp: 60 tablet, Rfl: 5 .  buPROPion (WELLBUTRIN XL) 150 MG 24 hr tablet, TAKE 3 TABLETS(450 MG) BY MOUTH DAILY, Disp: 270 tablet, Rfl: 1 .  busPIRone (BUSPAR) 15 MG tablet, TAKE 1 TABLET BY MOUTH TWICE DAILY, Disp: 180 tablet, Rfl: 1 .  lamoTRIgine (LAMICTAL) 100 MG tablet, 2 po q am, and 1.5 pills po qhs. (Patient taking differently: 150 mg. 2 po q am, and 1.5 pills po qhs.), Disp: 315 tablet, Rfl: 1 .  traZODone (DESYREL) 100 MG tablet, TAKE 1 TABLET(100 MG) BY MOUTH AT BEDTIME, Disp: 90 tablet, Rfl: 1 .  dicyclomine (BENTYL) 20 MG tablet, Take 1 tablet (20 mg total) by mouth 4 (four) times daily -  before meals and at bedtime. (Patient not taking: Reported on  02/08/2019), Disp: 40 tablet, Rfl: 0 .  hyoscyamine (LEVSIN SL) 0.125 MG SL tablet, Place 1 tablet (0.125 mg total) under the tongue every 6 (six) hours as needed (abd pain)., Disp: 10 tablet, Rfl: 0 .  ondansetron (ZOFRAN) 4 MG tablet, Take 1 tablet (4 mg total) by mouth every 8 (eight) hours as needed for nausea or vomiting., Disp: 30 tablet, Rfl: 1   Family History: family history includes COPD in her paternal grandfather; Cancer in her father, maternal uncle, and mother; Diabetes in her maternal uncle; Stroke in her maternal grandmother.    Social History:   reports that she quit  smoking about 19 months ago. Her smoking use included cigarettes. She smoked 0.50 packs per day. She has never used smokeless tobacco. She reports current alcohol use of about 4.0 standard drinks of alcohol per week. She reports that she does not use drugs.   Review of Systems: Constitutional: Denies weight loss/weight gain  Eyes: No changes in vision. ENT: No oral lesions, sore throat.  GI: see HPI.  Heme/Lymph: No easy bruising.  CV: No chest pain.  GU: No hematuria.  Integumentary: No rashes.  Neuro: No headaches.  Psych: No depression/anxiety.  Endocrine: No heat/cold intolerance.  Allergic/Immunologic: No urticaria.  Resp: No cough, SOB.  Musculoskeletal: No joint swelling.    Physical Examination: BP 92/63 (BP Location: Right Arm, Patient Position: Sitting, Cuff Size: Large)   Pulse 69   Temp (!) 97.1 F (36.2 C) (Temporal)   Ht 5\' 7"  (1.702 m)   Wt 171 lb 8 oz (77.8 kg)   BMI 26.86 kg/m  Gen: NAD, alert and oriented x 4 HEENT: PEERLA, EOMI, Neck: supple, no JVD Chest: CTA bilaterally, no wheezes, crackles, or other adventitious sounds CV: RRR, no m/g/c/r Abd: soft, NT, ND, +BS in all four quadrants; no HSM, guarding, ridigity, or rebound tenderness Ext: no edema, well perfused with 2+ pulses, Skin: no rash or lesions noted on observed skin Lymph: no noted LAD  Data Reviewed:  CT a/p 12/2018-IMPRESSION: No acute aortic or vascular abnormality. No acute intrathoracic, abdominal, pelvic abnormality to explain the patient's symptoms.    IMPRESSION: CT a/p 04/2018 Loose stool noted throughout the colon. No bowel obstruction or active inflammation. Normal appendix.   Assessment/Plan: [name] is a 64 y.o. female    [name] was seen today for follow-up.  Diagnoses and all orders for this visit:  Diverticulitis of colon  Lower abdominal pain  Personal history of colonic polyps  Other orders -     ondansetron (ZOFRAN) 4 MG tablet; Take 1 tablet (4  mg total) by mouth every 8 (eight) hours as needed for nausea or vomiting. -     hyoscyamine (LEVSIN SL) 0.125 MG SL tablet; Place 1 tablet (0.125 mg total) under the tongue every 6 (six) hours as needed (abd pain).    1.  Lower abdominal pain-CT has been negative for diverticulitis x 2, episodes occurring about every other month now.  She is due for colonoscopy based on history of colon polyps and will scheduled to evaluate.  We will also give Levsin to use during episodes.  She has mild nausea only during the episodes and will refill Zofran which she has used in the past with good success.  Between episodes of lower abdominal pain she feels well. Also encouraged her to look for food triggers of episodes of abd pain.  Patient denies CP, SOB, and use of blood thinners. I discussed the risks and benefits of  procedure including bleeding, perforation, infection, missed lesions, medication reactions and possible hospitalization or surgery if complications. All questions answered.   F/up PRN after colonoscopy   I personally performed the service, non-incident to. (WP)  Laurine Blazer, [facility]

## 2019-07-31 NOTE — Telephone Encounter (Signed)
Patient needs Sutab (copay card) ° °

## 2019-07-31 NOTE — Patient Instructions (Signed)
We are scheduling colonoscopy for evaluation.  I sent refills of nausea medication and medication for abdominal type pain to use during acute episodes.  Please keep a food log of any episode triggers.

## 2019-08-07 ENCOUNTER — Other Ambulatory Visit (HOSPITAL_COMMUNITY)
Admission: RE | Admit: 2019-08-07 | Discharge: 2019-08-07 | Disposition: A | Discharge status: Home / Self Care | Payer: HRSA Program | Source: Ambulatory Visit | Attending: Internal Medicine | Admitting: Internal Medicine

## 2019-08-07 ENCOUNTER — Other Ambulatory Visit: Payer: Self-pay

## 2019-08-07 ENCOUNTER — Other Ambulatory Visit: Payer: Self-pay | Admitting: Physician Assistant

## 2019-08-07 DIAGNOSIS — Z01812 Encounter for preprocedural laboratory examination: Secondary | ICD-10-CM | POA: Insufficient documentation

## 2019-08-07 DIAGNOSIS — Z20822 Contact with and (suspected) exposure to covid-19: Secondary | ICD-10-CM | POA: Diagnosis not present

## 2019-08-08 ENCOUNTER — Ambulatory Visit (HOSPITAL_COMMUNITY)
Admission: RE | Admit: 2019-08-08 | Discharge: 2019-08-08 | Disposition: A | Discharge status: Home / Self Care | Payer: BC Managed Care – PPO | Attending: Internal Medicine | Admitting: Internal Medicine

## 2019-08-08 ENCOUNTER — Encounter (HOSPITAL_COMMUNITY): Payer: Self-pay | Admitting: Internal Medicine

## 2019-08-08 ENCOUNTER — Encounter (HOSPITAL_COMMUNITY)
Admission: RE | Disposition: A | Discharge status: Home / Self Care | Payer: Self-pay | Source: Home / Self Care | Attending: Internal Medicine

## 2019-08-08 ENCOUNTER — Other Ambulatory Visit: Payer: Self-pay

## 2019-08-08 DIAGNOSIS — K5730 Diverticulosis of large intestine without perforation or abscess without bleeding: Secondary | ICD-10-CM | POA: Insufficient documentation

## 2019-08-08 DIAGNOSIS — Z79899 Other long term (current) drug therapy: Secondary | ICD-10-CM | POA: Insufficient documentation

## 2019-08-08 DIAGNOSIS — Z801 Family history of malignant neoplasm of trachea, bronchus and lung: Secondary | ICD-10-CM | POA: Insufficient documentation

## 2019-08-08 DIAGNOSIS — R1030 Lower abdominal pain, unspecified: Secondary | ICD-10-CM

## 2019-08-08 DIAGNOSIS — Z86010 Personal history of colonic polyps: Secondary | ICD-10-CM | POA: Diagnosis not present

## 2019-08-08 DIAGNOSIS — D123 Benign neoplasm of transverse colon: Secondary | ICD-10-CM | POA: Diagnosis not present

## 2019-08-08 DIAGNOSIS — K5732 Diverticulitis of large intestine without perforation or abscess without bleeding: Secondary | ICD-10-CM

## 2019-08-08 DIAGNOSIS — Z808 Family history of malignant neoplasm of other organs or systems: Secondary | ICD-10-CM | POA: Insufficient documentation

## 2019-08-08 DIAGNOSIS — F419 Anxiety disorder, unspecified: Secondary | ICD-10-CM | POA: Insufficient documentation

## 2019-08-08 DIAGNOSIS — Z87891 Personal history of nicotine dependence: Secondary | ICD-10-CM | POA: Insufficient documentation

## 2019-08-08 DIAGNOSIS — Z1211 Encounter for screening for malignant neoplasm of colon: Secondary | ICD-10-CM | POA: Insufficient documentation

## 2019-08-08 DIAGNOSIS — F319 Bipolar disorder, unspecified: Secondary | ICD-10-CM | POA: Insufficient documentation

## 2019-08-08 HISTORY — PX: POLYPECTOMY: SHX5525

## 2019-08-08 HISTORY — PX: COLONOSCOPY: SHX5424

## 2019-08-08 LAB — SARS CORONAVIRUS 2 (TAT 6-24 HRS): SARS Coronavirus 2: NEGATIVE

## 2019-08-08 SURGERY — COLONOSCOPY
Anesthesia: Moderate Sedation

## 2019-08-08 MED ORDER — SODIUM CHLORIDE FLUSH 0.9 % IV SOLN
INTRAVENOUS | Status: AC
  Filled 2019-08-08: qty 10

## 2019-08-08 MED ORDER — STERILE WATER FOR IRRIGATION IR SOLN
Status: DC | PRN
  Administered 2019-08-08: 1.5 mL

## 2019-08-08 MED ORDER — FENTANYL CITRATE (PF) 100 MCG/2ML IJ SOLN
INTRAMUSCULAR | Status: AC
  Filled 2019-08-08: qty 2

## 2019-08-08 MED ORDER — PROMETHAZINE HCL 25 MG/ML IJ SOLN
INTRAMUSCULAR | Status: AC
  Filled 2019-08-08: qty 1

## 2019-08-08 MED ORDER — MIDAZOLAM HCL 5 MG/5ML IJ SOLN
INTRAMUSCULAR | Status: DC | PRN
  Administered 2019-08-08 (×3): 2 mg via INTRAVENOUS

## 2019-08-08 MED ORDER — FENTANYL CITRATE (PF) 100 MCG/2ML IJ SOLN
INTRAMUSCULAR | Status: DC | PRN
  Administered 2019-08-08 (×2): 25 ug via INTRAVENOUS

## 2019-08-08 MED ORDER — SODIUM CHLORIDE 0.9 % IV SOLN
INTRAVENOUS | Status: DC
  Administered 2019-08-08: 1000 mL via INTRAVENOUS

## 2019-08-08 MED ORDER — MIDAZOLAM HCL 5 MG/5ML IJ SOLN
INTRAMUSCULAR | Status: AC
  Filled 2019-08-08: qty 10

## 2019-08-08 NOTE — Discharge Instructions (Signed)
No aspirin or NSAIDs for 24 hours. Resume usual medications as before. Phazyme 180 mg 1 tablet or capsule 3 times a day as needed for bloating(over-the-counter medication) High-fiber diet. No driving for 24 hours. Physician will call with biopsy results.   Colonoscopy, Adult, Care After This sheet gives you information about how to care for yourself after your procedure. Your doctor may also give you more specific instructions. If you have problems or questions, call your doctor. What can I expect after the procedure? After the procedure, it is common to have:  A small amount of blood in your poop (stool) for 24 hours.  Some gas.  Mild cramping or bloating in your belly (abdomen). Follow these instructions at home: Eating and drinking   Drink enough fluid to keep your pee (urine) pale yellow.  Follow instructions from your doctor about what you cannot eat or drink.  Return to your normal diet as told by your doctor. Avoid heavy or fried foods that are hard to digest. Activity  Rest as told by your doctor.  Do not sit for a long time without moving. Get up to take short walks every 1-2 hours. This is important. Ask for help if you feel weak or unsteady.  Return to your normal activities as told by your doctor. Ask your doctor what activities are safe for you. To help cramping and bloating:   Try walking around.  Put heat on your belly as told by your doctor. Use the heat source that your doctor recommends, such as a moist heat pack or a heating pad. ? Put a towel between your skin and the heat source. ? Leave the heat on for 20-30 minutes. ? Remove the heat if your skin turns bright red. This is very important if you are unable to feel pain, heat, or cold. You may have a greater risk of getting burned. General instructions  For the first 24 hours after the procedure: ? Do not drive or use machinery. ? Do not sign important documents. ? Do not drink alcohol. ? Do your  daily activities more slowly than normal. ? Eat foods that are soft and easy to digest.  Take over-the-counter or prescription medicines only as told by your doctor.  Keep all follow-up visits as told by your doctor. This is important. Contact a doctor if:  You have blood in your poop 2-3 days after the procedure. Get help right away if:  You have more than a small amount of blood in your poop.  You see large clumps of tissue (blood clots) in your poop.  Your belly is swollen.  You feel like you may vomit (nauseous).  You vomit.  You have a fever.  You have belly pain that gets worse, and medicine does not help your pain. Summary  After the procedure, it is common to have a small amount of blood in your poop. You may also have mild cramping and bloating in your belly.  For the first 24 hours after the procedure, do not drive or use machinery, do not sign important documents, and do not drink alcohol.  Get help right away if you have a lot of blood in your poop, feel like you may vomit, have a fever, or have more belly pain. This information is not intended to replace advice given to you by your health care provider. Make sure you discuss any questions you have with your health care provider. Document Revised: 09/18/2018 Document Reviewed: 09/18/2018 Elsevier Patient Education  [place].   Diverticulosis  Diverticulosis is a condition that develops when small pouches (diverticula) form in the wall of the large intestine (colon). The colon is where water is absorbed and stool (feces) is formed. The pouches form when the inside layer of the colon pushes through weak spots in the outer layers of the colon. You may have a few pouches or many of them. The pouches usually do not cause problems unless they become inflamed or infected. When this happens, the condition is called diverticulitis. What are the causes? The cause of this condition is not known. What increases the  risk? The following factors may make you more likely to develop this condition:  Being older than age 63. Your risk for this condition increases with age. Diverticulosis is rare among people younger than age 20. By age 17, many people have it.  Eating a low-fiber diet.  Having frequent constipation.  Being overweight.  Not getting enough exercise.  Smoking.  Taking over-the-counter pain medicines, like aspirin and ibuprofen.  Having a family history of diverticulosis. What are the signs or symptoms? In most people, there are no symptoms of this condition. If you do have symptoms, they may include:  Bloating.  Cramps in the abdomen.  Constipation or diarrhea.  Pain in the lower left side of the abdomen. How is this diagnosed? Because diverticulosis usually has no symptoms, it is most often diagnosed during an exam for other colon problems. The condition may be diagnosed by:  Using a flexible scope to examine the colon (colonoscopy).  Taking an X-ray of the colon after dye has been put into the colon (barium enema).  Having a CT scan. How is this treated? You may not need treatment for this condition. Your health care provider may recommend treatment to prevent problems. You may need treatment if you have symptoms or if you previously had diverticulitis. Treatment may include:  Eating a high-fiber diet.  Taking a fiber supplement.  Taking a live bacteria supplement (probiotic).  Taking medicine to relax your colon. Follow these instructions at home: Medicines  Take over-the-counter and prescription medicines only as told by your health care provider.  If told by your health care provider, take a fiber supplement or probiotic. Constipation prevention Your condition may cause constipation. To prevent or treat constipation, you may need to:  Drink enough fluid to keep your urine pale yellow.  Take over-the-counter or prescription medicines.  Eat foods that are  high in fiber, such as beans, whole grains, and fresh fruits and vegetables.  Limit foods that are high in fat and processed sugars, such as fried or sweet foods.  General instructions  Try not to strain when you have a bowel movement.  Keep all follow-up visits as told by your health care provider. This is important. Contact a health care provider if you:  Have pain in your abdomen.  Have bloating.  Have cramps.  Have not had a bowel movement in 3 days. Get help right away if:  Your pain gets worse.  Your bloating becomes very bad.  You have a fever or chills, and your symptoms suddenly get worse.  You vomit.  You have bowel movements that are bloody or black.  You have bleeding from your rectum. Summary  Diverticulosis is a condition that develops when small pouches (diverticula) form in the wall of the large intestine (colon).  You may have a few pouches or many of them.  This condition is most often diagnosed  during an exam for other colon problems.  Treatment may include increasing the fiber in your diet, taking supplements, or taking medicines. This information is not intended to replace advice given to you by your health care provider. Make sure you discuss any questions you have with your health care provider. Document Revised: 09/21/2018 Document Reviewed: 09/21/2018 Elsevier Patient Education  [place].   High-Fiber Diet Fiber, also called dietary fiber, is a type of carbohydrate that is found in fruits, vegetables, whole grains, and beans. A high-fiber diet can have many health benefits. Your health care provider may recommend a high-fiber diet to help:  Prevent constipation. Fiber can make your bowel movements more regular.  Lower your cholesterol.  Relieve the following conditions: ? Swelling of veins in the anus (hemorrhoids). ? Swelling and irritation (inflammation) of specific areas of the digestive tract (uncomplicated  diverticulosis). ? A problem of the large intestine (colon) that sometimes causes pain and diarrhea (irritable bowel syndrome, IBS).  Prevent overeating as part of a weight-loss plan.  Prevent heart disease, type 2 diabetes, and certain cancers. What is my plan? The recommended daily fiber intake in grams (g) includes:  38 g for men age 69 or younger.  30 g for men over age 81.  41 g for women age 49 or younger.  21 g for women over age 86. You can get the recommended daily intake of dietary fiber by:  Eating a variety of fruits, vegetables, grains, and beans.  Taking a fiber supplement, if it is not possible to get enough fiber through your diet. What do I need to know about a high-fiber diet?  It is better to get fiber through food sources rather than from fiber supplements. There is not a lot of research about how effective supplements are.  Always check the fiber content on the nutrition facts label of any prepackaged food. Look for foods that contain 5 g of fiber or more per serving.  Talk with a diet and nutrition specialist (dietitian) if you have questions about specific foods that are recommended or not recommended for your medical condition, especially if those foods are not listed below.  Gradually increase how much fiber you consume. If you increase your intake of dietary fiber too quickly, you may have bloating, cramping, or gas.  Drink plenty of water. Water helps you to digest fiber. What are tips for following this plan?  Eat a wide variety of high-fiber foods.  Make sure that half of the grains that you eat each day are whole grains.  Eat breads and cereals that are made with whole-grain flour instead of refined flour or white flour.  Eat brown rice, bulgur wheat, or millet instead of white rice.  Start the day with a breakfast that is high in fiber, such as a cereal that contains 5 g of fiber or more per serving.  Use beans in place of meat in soups,  salads, and pasta dishes.  Eat high-fiber snacks, such as berries, raw vegetables, nuts, and popcorn.  Choose whole fruits and vegetables instead of processed forms like juice or sauce. What foods can I eat?  Fruits Berries. Pears. Apples. Oranges. Avocado. Prunes and raisins. Dried figs. Vegetables Sweet potatoes. Spinach. Kale. Artichokes. Cabbage. Broccoli. Cauliflower. Green peas. Carrots. Squash. Grains Whole-grain breads. Multigrain cereal. Oats and oatmeal. Brown rice. Barley. Bulgur wheat. [place]. Quinoa. Bran muffins. Popcorn. Rye wafer crackers. Meats and other proteins Navy, kidney, and pinto beans. Soybeans. Split peas. Lentils. Nuts and  seeds. Dairy Fiber-fortified yogurt. Beverages Fiber-fortified soy milk. Fiber-fortified orange juice. Other foods Fiber bars. The items listed above may not be a complete list of recommended foods and beverages. Contact a dietitian for more options. What foods are not recommended? Fruits Fruit juice. Cooked, strained fruit. Vegetables Fried potatoes. Canned vegetables. Well-cooked vegetables. Grains White bread. Pasta made with refined flour. White rice. Meats and other proteins Fatty cuts of meat. Fried chicken or fried fish. Dairy Milk. Yogurt. Cream cheese. Sour cream. Fats and oils Butters. Beverages Soft drinks. Other foods Cakes and pastries. The items listed above may not be a complete list of foods and beverages to avoid. Contact a dietitian for more information. Summary  Fiber is a type of carbohydrate. It is found in fruits, vegetables, whole grains, and beans.  There are many health benefits of eating a high-fiber diet, such as preventing constipation, lowering blood cholesterol, helping with weight loss, and reducing your risk of heart disease, diabetes, and certain cancers.  Gradually increase your intake of fiber. Increasing too fast can result in cramping, bloating, and gas. Drink plenty of water while you  increase your fiber.  The best sources of fiber include whole fruits and vegetables, whole grains, nuts, seeds, and beans. This information is not intended to replace advice given to you by your health care provider. Make sure you discuss any questions you have with your health care provider. Document Revised: 12/27/2016 Document Reviewed: 12/27/2016 Elsevier Patient Education  [place].   Colon Polyps  Polyps are tissue growths inside the body. Polyps can grow in many places, including the large intestine (colon). A polyp may be a round bump or a mushroom-shaped growth. You could have one polyp or several. Most colon polyps are noncancerous (benign). However, some colon polyps can become cancerous over time. Finding and removing the polyps early can help prevent this. What are the causes? The exact cause of colon polyps is not known. What increases the risk? You are more likely to develop this condition if you:  Have a family history of colon cancer or colon polyps.  Are older than 52 or older than 45 if you are African American.  Have inflammatory bowel disease, such as ulcerative colitis or Crohn's disease.  Have certain hereditary conditions, such as: ? Familial adenomatous polyposis. ? Lynch syndrome. ? Turcot syndrome. ? Peutz-Jeghers syndrome.  Are overweight.  Smoke cigarettes.  Do not get enough exercise.  Drink too much alcohol.  Eat a diet that is high in fat and red meat and low in fiber.  Had childhood cancer that was treated with abdominal radiation. What are the signs or symptoms? Most polyps do not cause symptoms. If you have symptoms, they may include:  Blood coming from your rectum when having a bowel movement.  Blood in your stool. The stool may look dark red or black.  Abdominal pain.  A change in bowel habits, such as constipation or diarrhea. How is this diagnosed? This condition is diagnosed with a colonoscopy. This is a procedure  in which a lighted, flexible scope is inserted into the anus and then passed into the colon to examine the area. Polyps are sometimes found when a colonoscopy is done as part of routine cancer screening tests. How is this treated? Treatment for this condition involves removing any polyps that are found. Most polyps can be removed during a colonoscopy. Those polyps will then be tested for cancer. Additional treatment may be needed depending on the results of testing.  Follow these instructions at home: Lifestyle  Maintain a healthy weight, or lose weight if recommended by your health care provider.  Exercise every day or as told by your health care provider.  Do not use any products that contain nicotine or tobacco, such as cigarettes and e-cigarettes. If you need help quitting, ask your health care provider.  If you drink alcohol, limit how much you have: ? 0-1 drink a day for women. ? 0-2 drinks a day for men.  Be aware of how much alcohol is in your drink. In the [place] one drink equals one 12 oz bottle of beer (355 mL), one 5 oz glass of wine (148 mL), or one 1 oz shot of hard liquor (44 mL). Eating and drinking   Eat foods that are high in fiber, such as fruits, vegetables, and whole grains.  Eat foods that are high in calcium and vitamin D, such as milk, cheese, yogurt, eggs, liver, fish, and broccoli.  Limit foods that are high in fat, such as fried foods and desserts.  Limit the amount of red meat and processed meat you eat, such as hot dogs, sausage, bacon, and lunch meats. General instructions  Keep all follow-up visits as told by your health care provider. This is important. ? This includes having regularly scheduled colonoscopies. ? Talk to your health care provider about when you need a colonoscopy. Contact a health care provider if:  You have new or worsening bleeding during a bowel movement.  You have new or increased blood in your stool.  You have a change in bowel  habits.  You lose weight for no known reason. Summary  Polyps are tissue growths inside the body. Polyps can grow in many places, including the colon.  Most colon polyps are noncancerous (benign), but some can become cancerous over time.  This condition is diagnosed with a colonoscopy.  Treatment for this condition involves removing any polyps that are found. Most polyps can be removed during a colonoscopy. This information is not intended to replace advice given to you by your health care provider. Make sure you discuss any questions you have with your health care provider. Document Revised: 06/09/2017 Document Reviewed: 06/09/2017 Elsevier Patient Education  [place].

## 2019-08-08 NOTE — Op Note (Signed)
[facility] Patient Name: [name] Procedure Date: 08/08/2019 12:07 PM MRN: [id number] Date of Birth: March 17, 1955 Attending MD: Hildred Laser , MD CSN: [id number] Age: 64 Admit Type: Outpatient Procedure:                Colonoscopy Indications:              High risk colon cancer surveillance: Personal                            history of colonic polyps Providers:                Hildred Laser, MD, Otis Peak B. Sharon Seller, RN, Raphael Gibney, Technician Referring MD:             Orpah Melter, MD Medicines:                Fentanyl 50 micrograms IV, Midazolam 6 mg IV Complications:            No immediate complications. Estimated Blood Loss:     Estimated blood loss was minimal. Procedure:                Pre-Anesthesia Assessment:                           - Prior to the procedure, a History and Physical                            was performed, and patient medications and                            allergies were reviewed. The patient's tolerance of                            previous anesthesia was also reviewed. The risks                            and benefits of the procedure and the sedation                            options and risks were discussed with the patient.                            All questions were answered, and informed consent                            was obtained. Prior Anticoagulants: The patient has                            taken no previous anticoagulant or antiplatelet                            agents except for NSAID medication. ASA Grade  Assessment: II - A patient with mild systemic                            disease. After reviewing the risks and benefits,                            the patient was deemed in satisfactory condition to                            undergo the procedure.                           After obtaining informed consent, the colonoscope                            was  passed under direct vision. Throughout the                            procedure, the patient's blood pressure, pulse, and                            oxygen saturations were monitored continuously. The                            [id number]-[id number]) scope was introduced through                            the anus and advanced to the the terminal ileum,                            with identification of the appendiceal orifice and                            IC valve. The colonoscopy was performed without                            difficulty. The patient tolerated the procedure                            well. The quality of the bowel preparation was                            adequate. The terminal ileum, ileocecal valve,                            appendiceal orifice, and rectum were photographed. Scope In: 1:14:58 PM Scope Out: 1:39:37 PM Scope Withdrawal Time: 0 hours 16 minutes 11 seconds  Total Procedure Duration: 0 hours 24 minutes 39 seconds  Findings:      The perianal and digital rectal examinations were normal.      The terminal ileum appeared normal.      Scattered diverticula were found in the sigmoid colon, descending colon,       splenic flexure and transverse colon.      Two polyps were found in  the splenic flexure. The polyps were 6 to 8 mm       in size. These polyps were removed with a cold snare. Resection and       retrieval were complete. The pathology specimen was placed into Bottle       Number 1.      The retroflexed view of the distal rectum and anal verge was normal and       showed no anal or rectal abnormalities. Impression:               - The examined portion of the ileum was normal.                           - Diverticulosis in the sigmoid colon, in the                            descending colon, at the splenic flexure and in the                            transverse colon.                           - Two 6 to 8 mm polyps at the splenic flexure,                             removed with a cold snare. Resected and retrieved. Moderate Sedation:      Moderate (conscious) sedation was administered by the endoscopy nurse       and supervised by the endoscopist. The following parameters were       monitored: oxygen saturation, heart rate, blood pressure, CO2       capnography and response to care. Total physician intraservice time was       30 minutes. Recommendation:           - Patient has a contact number available for                            emergencies. The signs and symptoms of potential                            delayed complications were discussed with the                            patient. Return to normal activities tomorrow.                            Written discharge instructions were provided to the                            patient.                           - High fiber diet today.                           - Continue present medications.                           -  No aspirin, ibuprofen, naproxen, or other                            non-steroidal anti-inflammatory drugs for 1 day.                           - Await pathology results.                           - Repeat colonoscopy is recommended. The                            colonoscopy date will be determined after pathology                            results from today's exam become available for                            review. Procedure Code(s):        --- Professional ---                           [phone], Colonoscopy, flexible; with removal of                            tumor(s), polyp(s), or other lesion(s) by snare                            technique                           99153, Moderate sedation; each additional 15                            minutes intraservice time                           G0500, Moderate sedation services provided by the                            same physician or other qualified health care                            professional  performing a gastrointestinal                            endoscopic service that sedation supports,                            requiring the presence of an independent trained                            observer to assist in the monitoring of the                            patient's level of consciousness and physiological  status; initial 15 minutes of intra-service time;                            patient age 65 years or older (additional time may                            be reported with [phone], as appropriate) Diagnosis Code(s):        --- Professional ---                           Z86.010, Personal history of colonic polyps                           K63.5, Polyp of colon                           K57.30, Diverticulosis of large intestine without                            perforation or abscess without bleeding CPT copyright 2019 American Medical Association. All rights reserved. The codes documented in this report are preliminary and upon coder review may  be revised to meet current compliance requirements. Hildred Laser, MD Hildred Laser, MD 08/08/2019 1:49:21 PM This report has been signed electronically. Number of Addenda: 0

## 2019-08-08 NOTE — H&P (Signed)
[name] is an 64 y.o. female.   Chief Complaint: Patient is here for colonoscopy. HPI: Patient is 64 year old Caucasian female who has history of tubular adenoma(2009) as well as history of lymphocytic colitis(2012) who has a recurrent left-sided abdominal pain and bloating.  She was treated for diverticulitis last year.  Since then she has had two negative CTs.  She denies melena or rectal bleeding.  Lately her bowels been regular.  She takes probiotic. She may take ibuprofen 3-4 times in a month. Family history is negative for colorectal carcinoma or IBD.  Past Medical History:  Diagnosis Date  . Anxiety   . Bipolar 1 disorder ([place])   . Depression   . Diverticulitis   . Diverticulitis of colon 03/15/2016    Past Surgical History:  Procedure Laterality Date  . COLONOSCOPY    . oophrorectomy    . TUBAL LIGATION      Family History  Problem Relation Age of Onset  . Cancer Mother        lichen planus - oral cancer  . Cancer Father        lung  . Cancer Maternal Uncle        uterine  . Diabetes Maternal Uncle   . Stroke Maternal Grandmother   . COPD Paternal Grandfather    Social History:  reports that she quit smoking about 19 months ago. Her smoking use included cigarettes. She smoked 0.50 packs per day. She has never used smokeless tobacco. She reports current alcohol use of about 4.0 standard drinks of alcohol per week. She reports that she does not use drugs.  Allergies:  Allergies  Allergen Reactions  . Meperidine Hives  . 5-Alpha Reductase Inhibitors     Medications Prior to Admission  Medication Sig Dispense Refill  . ALPRAZolam (XANAX) 0.5 MG tablet Take 1 tablet (0.5 mg total) by mouth 2 (two) times daily as needed for anxiety. (Patient taking differently: Take 0.5 mg by mouth 2 (two) times daily. ) 60 tablet 5  . BLACK ELDERBERRY PO Take 1,000 mg by mouth daily.    Marland Kitchen buPROPion (WELLBUTRIN XL) 150 MG 24 hr tablet TAKE 3 TABLETS(450 MG) BY MOUTH DAILY 270  tablet 1  . busPIRone (BUSPAR) 15 MG tablet TAKE 1 TABLET BY MOUTH TWICE DAILY (Patient taking differently: Take 15 mg by mouth 2 (two) times daily. ) 180 tablet 1  . calcium elemental as carbonate (TUMS ULTRA 1000) 400 MG chewable tablet Chew 1,000 mg by mouth daily as needed for heartburn.    . Calcium-Magnesium-Zinc 333-133-5 MG TABS Take 1 tablet by mouth daily.    . Cholecalciferol (VITAMIN D3) 50 MCG (2000 UT) TABS Take 2,000 Units by mouth daily.    . Cyanocobalamin (VITAMIN B-12) 5000 MCG TBDP Take 5,000 mg by mouth daily.    . hyoscyamine (LEVSIN SL) 0.125 MG SL tablet Place 1 tablet (0.125 mg total) under the tongue every 6 (six) hours as needed (abd pain). 10 tablet 0  . ibuprofen (ADVIL) 200 MG tablet Take 400-600 mg by mouth every 6 (six) hours as needed for headache, mild pain or moderate pain.    Marland Kitchen lamoTRIgine (LAMICTAL) 100 MG tablet 2 po q am, and 1.5 pills po qhs. (Patient taking differently: Take 150 mg by mouth 2 (two) times daily. ) 315 tablet 1  . Omega-3 1000 MG CAPS Take 1,000 mg by mouth daily.    . traZODone (DESYREL) 100 MG tablet TAKE 1 TABLET(100 MG) BY MOUTH AT BEDTIME (  Patient taking differently: Take 100 mg by mouth at bedtime. ) 90 tablet 1  . APPLE CIDER VINEGAR PO Take 400 mg by mouth daily.    . Ascorbic Acid (VITAMIN C) 1000 MG tablet Take 1,000 mg by mouth daily.    Marland Kitchen dicyclomine (BENTYL) 20 MG tablet Take 1 tablet (20 mg total) by mouth 4 (four) times daily -  before meals and at bedtime. (Patient not taking: Reported on 02/08/2019) 40 tablet 0  . ondansetron (ZOFRAN) 4 MG tablet Take 1 tablet (4 mg total) by mouth every 8 (eight) hours as needed for nausea or vomiting. 30 tablet 1    Results for orders placed or performed during the hospital encounter of 08/07/19 (from the past 48 hour(s))  SARS CORONAVIRUS 2 (TAT 6-24 HRS) Nasopharyngeal Nasopharyngeal Swab     Status: None   Collection Time: 08/07/19  9:29 AM   Specimen: Nasopharyngeal Swab  Result Value  Ref Range   SARS Coronavirus 2 NEGATIVE NEGATIVE    Comment: (NOTE) SARS-CoV-2 target nucleic acids are NOT DETECTED. The SARS-CoV-2 RNA is generally detectable in upper and lower respiratory specimens during the acute phase of infection. Negative results do not preclude SARS-CoV-2 infection, do not rule out co-infections with other pathogens, and should not be used as the sole basis for treatment or other patient management decisions. Negative results must be combined with clinical observations, patient history, and epidemiological information. The expected result is Negative. Fact Sheet for Patients: SugarRoll.be Fact Sheet for Healthcare Providers: https://www.woods-mathews.com/ This test is not yet approved or cleared by the [place] FDA and  has been authorized for detection and/or diagnosis of SARS-CoV-2 by FDA under an Emergency Use Authorization (EUA). This EUA will remain  in effect (meaning this test can be used) for the duration of the COVID-19 declaration under Section 56 4(b)(1) of the Act, 21 U.S.C. section 360bbb-3(b)(1), unless the authorization is terminated or revoked sooner. Performed at [facility], [place] [address], [place], [place] [address]    No results found.  Review of Systems  Blood pressure 102/69, pulse 74, temperature 98 F (36.7 C), temperature source Oral, resp. rate 16, height 5\' 7"  (1.702 m), weight 74.8 kg, SpO2 96 %. Physical Exam  Constitutional: She appears well-developed and well-nourished.  HENT:  Mouth/Throat: Oropharynx is clear and moist.  Eyes: Conjunctivae are normal. No scleral icterus.  Neck: No thyromegaly present.  Cardiovascular: Normal rate, regular rhythm and normal heart sounds.  No murmur heard. Respiratory: Effort normal and breath sounds normal.  GI:  Abdomen is symmetrical.  Small scar noted below the umbilicus.  Abdomen is soft and nontender with organomegaly or  masses  Musculoskeletal:        General: No edema.  Lymphadenopathy:    She has no cervical adenopathy.  Neurological: She is alert.  Skin: Skin is warm and dry.     Assessment/Plan  History of colonic adenoma and diverticulitis. Surveillance diagnostic colonoscopy.   Hildred Laser, MD 08/08/2019, 1:02 PM

## 2019-08-09 LAB — SURGICAL PATHOLOGY

## 2019-08-23 ENCOUNTER — Other Ambulatory Visit: Payer: Self-pay | Admitting: Physician Assistant

## 2019-08-23 NOTE — Telephone Encounter (Signed)
Pt wants TH to know that when she last filled her Xanax she was in [place]. They had the RX transferred to [place]. But the refills don't transfer. She will need a new RX sent in here locally.

## 2019-08-23 NOTE — Telephone Encounter (Signed)
See note

## 2019-08-24 ENCOUNTER — Other Ambulatory Visit: Payer: Self-pay | Admitting: Physician Assistant

## 2019-11-09 ENCOUNTER — Other Ambulatory Visit: Payer: Self-pay | Admitting: Physician Assistant

## 2019-11-13 NOTE — Telephone Encounter (Signed)
Please review

## 2019-12-05 ENCOUNTER — Other Ambulatory Visit: Payer: Self-pay | Admitting: Physician Assistant

## 2019-12-05 NOTE — Telephone Encounter (Signed)
Please review

## 2019-12-10 ENCOUNTER — Other Ambulatory Visit: Payer: Self-pay | Admitting: Physician Assistant

## 2019-12-11 ENCOUNTER — Telehealth: Payer: Self-pay | Admitting: Physician Assistant

## 2019-12-11 NOTE — Telephone Encounter (Signed)
[name] called because the pharmacy told her she needed to contact our office for the refill of her Lamictal.  She did make appt. For 11/5.  She has been taking 1.5 tabs qAm and qHS.  I said the prescription was 2 qAM and 1.5 qHS and verified again how she was taking it.  She looked at the bottle and realized it did say 2 QAM.  She said she could take it that way. Her anxiety has worse on some days so maybe taking the increased amount would help.  Either way, she needs a refill.  Please send it in.  Call her if there are questions.

## 2019-12-11 NOTE — Telephone Encounter (Signed)
Please advise 

## 2019-12-12 ENCOUNTER — Other Ambulatory Visit: Payer: Self-pay | Admitting: Physician Assistant

## 2019-12-12 NOTE — Telephone Encounter (Signed)
Let her know I sent in the Rx for Lamictal on 12/05/19. Have her call the pharmacy.  And stay at 1.5 pills bid until next visit. Thanks.

## 2019-12-12 NOTE — Telephone Encounter (Signed)
Updated Rx to lamictal 100 mg take 1.5 tablets bid. The Rx on 12/05/2019 did not transmit. Resent with update.

## 2019-12-12 NOTE — Telephone Encounter (Signed)
This was resent

## 2019-12-12 NOTE — Telephone Encounter (Signed)
[place] called to check on status of the Lamictal prescription.  She just left the pharmacy and they said they don't have the prescription.  Epic does show that verification of the prescription is pending.  So made they didn't get it.  They were going to resend again.  Please resend it.  I did tell her to stay at 1.5 tabs bid as she was taking it now.

## 2019-12-21 ENCOUNTER — Other Ambulatory Visit: Payer: Self-pay | Admitting: Physician Assistant

## 2019-12-21 NOTE — Telephone Encounter (Signed)
Would we be able to phone in this RX for her today? She will be out this weekend.

## 2019-12-22 ENCOUNTER — Other Ambulatory Visit: Payer: Self-pay | Admitting: Physician Assistant

## 2019-12-22 ENCOUNTER — Telehealth: Payer: Self-pay | Admitting: Physician Assistant

## 2019-12-22 MED ORDER — ALPRAZOLAM 0.5 MG PO TABS: ORAL_TABLET | ORAL | 0 refills | Status: DC

## 2019-12-22 NOTE — Telephone Encounter (Signed)
Saturday on-call. Needs Xanax RF. Chart review and will send in small qty, as I know her well. Reminded to contact office during hours for things like this. [facility] was down all day yesterday which could have been the problem.

## 2020-01-01 ENCOUNTER — Ambulatory Visit (INDEPENDENT_AMBULATORY_CARE_PROVIDER_SITE_OTHER): Payer: 59 | Admitting: Family Medicine

## 2020-01-01 ENCOUNTER — Encounter: Payer: Self-pay | Admitting: Family Medicine

## 2020-01-01 ENCOUNTER — Other Ambulatory Visit: Payer: Self-pay

## 2020-01-01 VITALS — BP 100/60 | HR 76 | Ht 67.0 in | Wt 168.0 lb

## 2020-01-01 DIAGNOSIS — G4089 Other seizures: Secondary | ICD-10-CM | POA: Diagnosis not present

## 2020-01-01 DIAGNOSIS — Z9289 Personal history of other medical treatment: Secondary | ICD-10-CM | POA: Diagnosis not present

## 2020-01-01 DIAGNOSIS — Z87820 Personal history of traumatic brain injury: Secondary | ICD-10-CM

## 2020-01-01 DIAGNOSIS — F3175 Bipolar disorder, in partial remission, most recent episode depressed: Secondary | ICD-10-CM | POA: Diagnosis not present

## 2020-01-01 DIAGNOSIS — R5383 Other fatigue: Secondary | ICD-10-CM

## 2020-01-01 DIAGNOSIS — R251 Tremor, unspecified: Secondary | ICD-10-CM
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
## 2020-01-01 NOTE — Patient Instructions (Signed)
  HAPPY FALL!  I appreciate the opportunity to provide you with care for your health and wellness. Today we discussed: established care   Follow up: 3 months  Labs today at [facility] Referrals today: Neurology  Will defer MRI to neurologist.  It was nice to meet you today.  Please continue to practice social distancing to keep you, your family, and our community safe.  If you must go out, please wear a mask and practice good handwashing.  It was a pleasure to see you and I look forward to continuing to work together on your health and well-being. Please do not hesitate to call the office if you need care or have questions about your care.  Have a wonderful day and week. With Gratitude, Cherly Beach, DNP, AGNP-BC

## 2020-01-02 ENCOUNTER — Encounter: Payer: Self-pay | Admitting: Family Medicine

## 2020-01-02 DIAGNOSIS — R5383 Other fatigue: Secondary | ICD-10-CM | POA: Insufficient documentation

## 2020-01-02 DIAGNOSIS — Z87820 Personal history of traumatic brain injury: Secondary | ICD-10-CM | POA: Insufficient documentation

## 2020-01-02 DIAGNOSIS — R251 Tremor, unspecified: Secondary | ICD-10-CM | POA: Insufficient documentation

## 2020-01-02 DIAGNOSIS — Z9289 Personal history of other medical treatment: Secondary | ICD-10-CM | POA: Insufficient documentation

## 2020-01-02 DIAGNOSIS — G4089 Other seizures: Secondary | ICD-10-CM | POA: Insufficient documentation

## 2020-01-02 LAB — TSH: TSH: 1.34 u[IU]/mL (ref 0.450–4.500)

## 2020-01-02 LAB — VITAMIN D 25 HYDROXY (VIT D DEFICIENCY, FRACTURES): Vit D, 25-Hydroxy: 26.9 ng/mL — ABNORMAL LOW (ref 30.0–100.0)

## 2020-01-02 LAB — COMPREHENSIVE METABOLIC PANEL
ALT: 12 IU/L (ref 0–32)
AST: 15 IU/L (ref 0–40)
Albumin/Globulin Ratio: 2.4 — ABNORMAL HIGH (ref 1.2–2.2)
Albumin: 4.6 g/dL (ref 3.8–4.8)
Alkaline Phosphatase: 101 IU/L (ref 44–121)
BUN/Creatinine Ratio: 17 (ref 12–28)
BUN: 13 mg/dL (ref 8–27)
Bilirubin Total: 0.3 mg/dL (ref 0.0–1.2)
CO2: 26 mmol/L (ref 20–29)
Calcium: 9.8 mg/dL (ref 8.7–10.3)
Chloride: 104 mmol/L (ref 96–106)
Creatinine, Ser: 0.76 mg/dL (ref 0.57–1.00)
GFR calc Af Amer: 97 mL/min/{1.73_m2} (ref >59)
GFR calc non Af Amer: 84 mL/min/{1.73_m2} (ref >59)
Globulin, Total: 1.9 g/dL (ref 1.5–4.5)
Glucose: 97 mg/dL (ref 65–99)
Potassium: 4.9 mmol/L (ref 3.5–5.2)
Sodium: 143 mmol/L (ref 134–144)
Total Protein: 6.5 g/dL (ref 6.0–8.5)

## 2020-01-02 LAB — CBC
Hematocrit: 44.8 % (ref 34.0–46.6)
Hemoglobin: 14.9 g/dL (ref 11.1–15.9)
MCH: 33.7 pg — ABNORMAL HIGH (ref 26.6–33.0)
MCHC: 33.3 g/dL (ref 31.5–35.7)
MCV: 101 fL — ABNORMAL HIGH (ref 79–97)
Platelets: 226 10*3/uL (ref 150–450)
RBC: 4.42 x10E6/uL (ref 3.77–5.28)
RDW: 11.6 % — ABNORMAL LOW (ref 11.7–15.4)
WBC: 6.1 10*3/uL (ref 3.4–10.8)

## 2020-01-02 LAB — VITAMIN B12: Vitamin B-12: 567 pg/mL (ref 232–1245)

## 2020-01-02 LAB — HEMOGLOBIN A1C
Est. average glucose Bld gHb Est-mCnc: 105 mg/dL
Hgb A1c MFr Bld: 5.3 % (ref 4.8–5.6)

## 2020-01-02 NOTE — Assessment & Plan Note (Signed)
Currently in treatment and doing well.

## 2020-01-02 NOTE — Assessment & Plan Note (Signed)
Tremor bilaterally noted, she reports more trouble with the Left than right. Present with outstretched arms. Referral to Neurology for further assessment.

## 2020-01-02 NOTE — Assessment & Plan Note (Signed)
Updated labs ordered.

## 2020-01-02 NOTE — Assessment & Plan Note (Signed)
Referral to Neurology for better assessment of history and current symptoms.

## 2020-01-04 ENCOUNTER — Encounter: Payer: Self-pay | Admitting: Diagnostic Neuroimaging

## 2020-01-04 ENCOUNTER — Other Ambulatory Visit: Payer: Self-pay

## 2020-01-04 ENCOUNTER — Ambulatory Visit (INDEPENDENT_AMBULATORY_CARE_PROVIDER_SITE_OTHER): Payer: 59 | Admitting: Diagnostic Neuroimaging

## 2020-01-04 VITALS — BP 105/76 | HR 76 | Ht 67.0 in | Wt 172.0 lb

## 2020-01-04 DIAGNOSIS — G250 Essential tremor: Secondary | ICD-10-CM | POA: Diagnosis not present

## 2020-01-04 DIAGNOSIS — G40109 Localization-related (focal) (partial) symptomatic epilepsy and epileptic syndromes with simple partial seizures, not intractable, without status epilepticus: Secondary | ICD-10-CM | POA: Diagnosis not present

## 2020-01-04 DIAGNOSIS — R251 Tremor, unspecified: Secondary | ICD-10-CM | POA: Diagnosis not present
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
## 2020-01-04 NOTE — Progress Notes (Signed)
[facility]  PATIENT: [name] DOB: Nov 08, 1955  REFERRING CLINICIAN: Perlie Mayo, NP HISTORY FROM: patient  REASON FOR VISIT: new consult    HISTORICAL  CHIEF COMPLAINT:  Chief Complaint  Patient presents with  . New Patient (Initial Visit)    rm 7- Pt said she has a bruses on her brain from a ladder falling on her head. Pt said she gets tremors in the morning.    HISTORY OF PRESENT ILLNESS:   64 year old female here for evaluation of seizures and tremors.  In 1990s patient was struck on the head by a ladder.  Following this she had intermittent episodes of left-sided numbness.  Patient had neurology evaluation was diagnosed with sensory seizures due to right brain injury.  She was tried on antiseizure medicine but could not tolerate it.  Patient would have episodes of left face, arm or leg numbness and tightness sensation lasting up to 30 minutes at a time.  Patient has had these randomly throughout the past 30 years.  Sometimes these occur 5 times a month.  In higher stress she has had them up to 15-20 times per month.  Patient also has depression, anxiety, bipolar disorder, on Lamictal, Wellbutrin and BuSpar.  In the last 1 year she has noticed increasing tremors.  She has postural tremor in her head and hands.  Symptoms are worse when she is under high stress or panic.  Her father and sister also had similar type of tremors.   REVIEW OF SYSTEMS: Full 14 system review of systems performed and negative with exception of: As per HPI.  ALLERGIES: Allergies  Allergen Reactions  . Meperidine Hives  . 5-Alpha Reductase Inhibitors     HOME MEDICATIONS: Outpatient Medications Prior to Visit  Medication Sig Dispense Refill  . ALPRAZolam (XANAX) 0.5 MG tablet TAKE 1 TABLET(0.5 MG) BY MOUTH TWICE DAILY AS NEEDED FOR ANXIETY 60 tablet 0  . APPLE CIDER VINEGAR PO Take 400 mg by mouth daily.    . Ascorbic Acid (VITAMIN C) 1000 MG tablet Take 1,000  mg by mouth daily.    Marland Kitchen buPROPion (WELLBUTRIN XL) 150 MG 24 hr tablet TAKE 3 TABLETS(450 MG) BY MOUTH DAILY 270 tablet 1  . busPIRone (BUSPAR) 15 MG tablet TAKE 1 TABLET BY MOUTH TWICE DAILY 180 tablet 1  . calcium elemental as carbonate (TUMS ULTRA 1000) 400 MG chewable tablet Chew 1,000 mg by mouth daily as needed for heartburn.    . Calcium-Magnesium-Zinc 333-133-5 MG TABS Take 1 tablet by mouth daily.    . Cholecalciferol (VITAMIN D3) 50 MCG (2000 UT) TABS Take 2,000 Units by mouth daily.    . Cyanocobalamin (VITAMIN B-12) 5000 MCG TBDP Take 5,000 mg by mouth daily.    Marland Kitchen lamoTRIgine (LAMICTAL) 100 MG tablet Take 1.5 tablets (150 mg total) by mouth 2 (two) times daily. 270 tablet 0  . Omega-3 1000 MG CAPS Take 1,000 mg by mouth daily.    . traZODone (DESYREL) 100 MG tablet TAKE 1 TABLET(100 MG) BY MOUTH AT BEDTIME (Patient taking differently: Take 100 mg by mouth at bedtime. ) 90 tablet 1   No facility-administered medications prior to visit.    PAST MEDICAL HISTORY: Past Medical History:  Diagnosis Date  . Anxiety   . Anxiety state 01/01/2018  . Bipolar 1 disorder ([place])   . Bipolar disorder ([place]) 12/12/2013  . Depression   . Diverticulitis   . Diverticulitis of colon 03/15/2016  . Insomnia 01/01/2018  . Tobacco abuse  06/25/2016  . Tubular adenoma 10/02/2018    PAST SURGICAL HISTORY: Past Surgical History:  Procedure Laterality Date  . COLONOSCOPY    . COLONOSCOPY N/A 08/08/2019   Procedure: COLONOSCOPY;  Surgeon: Rogene Houston, MD;  [facility];  Service: Endoscopy;  Laterality: N/A;  220  . oophrorectomy    . POLYPECTOMY  08/08/2019   Procedure: POLYPECTOMY;  Surgeon: Rogene Houston, MD;  Location: [facility];  Service: Endoscopy;;  . TUBAL LIGATION      FAMILY HISTORY: Family History  Problem Relation Age of Onset  . Cancer Mother        lichen planus - oral cancer  . Cancer Father        lung  . Cancer Maternal Uncle        uterine  . Diabetes  Maternal Uncle   . Stroke Maternal Grandmother   . COPD Paternal Grandfather     SOCIAL HISTORY: Social History   Socioeconomic History  . Marital status: Divorced    Spouse name: Not on file  . Number of children: 2  . Years of education: 70  . Highest education level: Not on file  Occupational History  . Occupation: Psychologist, 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[employer], [place]  Tobacco Use  . Smoking status: Former Smoker    Packs/day: 0.50    Types: Cigarettes    Quit date: 12/19/2017    Years since quitting: 2.0  . Smokeless tobacco: Never Used  . Tobacco comment: none in 9 months. after smoking 1 pack a day x 20 yrs.  Vaping Use  . Vaping Use: Never used  Substance and Sexual Activity  . Alcohol use: Yes    Alcohol/week: 4.0 standard drinks    Types: 4 Glasses of wine per week    Comment: week  . Drug use: No  . Sexual activity: Not Currently    Birth control/protection: Post-menopausal  Other Topics Concern  . Not on file  Social History Narrative   Lives alone   She has 2 sons that are close by.      She enjoys going out to dinner and spend time with family.      Diet reports eating all foods.   Caffeine none   Water 6 to 8 cups regularly daily.      Social Determinants of Health   Financial Resource Strain:   . Difficulty of Paying Living Expenses: Not on file  Food Insecurity:   . Worried About Charity fundraiser in the Last Year: Not on file  . Ran Out of Food in the Last Year: Not on file  Transportation Needs:   . Lack of Transportation (Medical): Not on file  . Lack of Transportation (Non-Medical): Not on file  Physical Activity:   . Days of Exercise per Week: Not on file  . Minutes of Exercise per Session: Not on file  Stress:   . Feeling of Stress : Not on file  Social Connections:   . Frequency of Communication with Friends and Family: Not on file  . Frequency of Social Gatherings with Friends and Family: Not on file  . Attends Religious  Services: Not on file  . Active Member of Clubs or Organizations: Not on file  . Attends Archivist Meetings: Not on file  . Marital Status: Not on file  Intimate Partner Violence:   . Fear of Current or Ex-Partner: Not on file  . Emotionally Abused: Not on file  .  Physically Abused: Not on file  . Sexually Abused: Not on file     PHYSICAL EXAM  GENERAL EXAM/CONSTITUTIONAL: Vitals:  Vitals:   01/04/20 1030  BP: 105/76  Pulse: 76  Weight: 172 lb (78 kg)  Height: 5\' 7"  (1.702 m)     Body mass index is 26.94 kg/m. Wt Readings from Last 3 Encounters:  01/04/20 172 lb (78 kg)  01/01/20 168 lb (76.2 kg)  08/08/19 165 lb (74.8 kg)     Patient is in no distress; well developed, nourished and groomed; neck is supple  CARDIOVASCULAR:  Examination of carotid arteries is normal; no carotid bruits  Regular rate and rhythm, no murmurs  Examination of peripheral vascular system by observation and palpation is normal  EYES:  Ophthalmoscopic exam of optic discs and posterior segments is normal; no papilledema or hemorrhages  No exam data present  MUSCULOSKELETAL:  Gait, strength, tone, movements noted in Neurologic exam below  NEUROLOGIC: MENTAL STATUS:  No flowsheet data found.  awake, alert, oriented to person, place and time  recent and remote memory intact  normal attention and concentration  language fluent, comprehension intact, naming intact  fund of knowledge appropriate  CRANIAL NERVE:   2nd - no papilledema on fundoscopic exam  2nd, 3rd, 4th, 6th - pupils equal and reactive to light, visual fields full to confrontation, extraocular muscles intact, no nystagmus  5th - facial sensation symmetric  7th - facial strength symmetric  8th - hearing intact  9th - palate elevates symmetrically, uvula midline  11th - shoulder shrug symmetric  12th - tongue protrusion midline  MOTOR:   normal bulk and tone, full strength in the BUE,  BLE  POSTURAL TREMOR IN BUE AND HEAD  SENSORY:   normal and symmetric to light touch, temperature, vibration  COORDINATION:   finger-nose-finger, fine finger movements normal  REFLEXES:   deep tendon reflexes present and symmetric  GAIT/STATION:   narrow based gait     DIAGNOSTIC DATA (LABS, IMAGING, TESTING) - I reviewed patient records, labs, notes, testing and imaging myself where available.  Lab Results  Component Value Date   WBC 6.1 01/01/2020   HGB 14.9 01/01/2020   HCT 44.8 01/01/2020   MCV 101 (H) 01/01/2020   PLT 226 01/01/2020      Component Value Date/Time   NA 143 01/01/2020 1127   K 4.9 01/01/2020 1127   CL 104 01/01/2020 1127   CO2 26 01/01/2020 1127   GLUCOSE 97 01/01/2020 1127   GLUCOSE 114 (H) 12/11/2018 1817   BUN 13 01/01/2020 1127   CREATININE 0.76 01/01/2020 1127   CREATININE 0.94 04/08/2016 1226   CALCIUM 9.8 01/01/2020 1127   PROT 6.5 01/01/2020 1127   ALBUMIN 4.6 01/01/2020 1127   AST 15 01/01/2020 1127   ALT 12 01/01/2020 1127   ALKPHOS 101 01/01/2020 1127   BILITOT 0.3 01/01/2020 1127   GFRNONAA 84 01/01/2020 1127   GFRAA 97 01/01/2020 1127   No results found for: CHOL, HDL, LDLCALC, LDLDIRECT, TRIG, CHOLHDL Lab Results  Component Value Date   HGBA1C 5.3 01/01/2020   Lab Results  Component Value Date   [id number] 419 01/01/2020   Lab Results  Component Value Date   TSH 1.340 01/01/2020    12/11/18 CT head  - Unremarkable noncontrast head CT.    ASSESSMENT AND PLAN  64 y.o. year old female here with:  Dx:  1. Seizure disorder, partial sensory ([place])   2. Essential tremor   3. Excessive physiologic  tremor     PLAN:  RIGHT BRAIN INJURY / LEFT BODY SENSORY SEIZURES (since 1990 head injury; ladder hit head) - slightly increased lately in setting of increased work and family stress; continue lamotrigine (may consider to increase lamotrigine to 200mg  twice a day which can help seizure control; consider to reduce  wellbutrin and buspar which can aggravate seizures; patient will discuss with psychiatry)  POSTURAL TREMORS (likely combination of familial / essential tremor + anxiety associated tremor) - patient already on polypharmacy; could consider propranolol or primidone in future; agree with managing life / work stress, which may help improve her tremor control  Return for return to PCP, pending if symptoms worsen or fail to improve.    Penni Bombard, MD 68/93/4068, 40:33 AM Certified in Neurology, Neurophysiology and Neuroimaging  [facility] [address], [place], [place] [address] [phone]

## 2020-01-11 ENCOUNTER — Ambulatory Visit: Payer: BC Managed Care – PPO | Admitting: Physician Assistant

## 2020-01-11 ENCOUNTER — Other Ambulatory Visit: Payer: Self-pay

## 2020-01-14 ENCOUNTER — Encounter: Payer: Self-pay | Admitting: Physician Assistant

## 2020-01-14 ENCOUNTER — Ambulatory Visit (INDEPENDENT_AMBULATORY_CARE_PROVIDER_SITE_OTHER): Payer: Self-pay | Admitting: Physician Assistant

## 2020-01-14 ENCOUNTER — Other Ambulatory Visit: Payer: Self-pay

## 2020-01-14 DIAGNOSIS — F411 Generalized anxiety disorder: Secondary | ICD-10-CM

## 2020-01-14 DIAGNOSIS — G4700 Insomnia, unspecified: Secondary | ICD-10-CM

## 2020-01-14 DIAGNOSIS — F4310 Post-traumatic stress disorder, unspecified: Secondary | ICD-10-CM

## 2020-01-14 DIAGNOSIS — R251 Tremor, unspecified: Secondary | ICD-10-CM

## 2020-01-14 DIAGNOSIS — F3175 Bipolar disorder, in partial remission, most recent episode depressed: Secondary | ICD-10-CM

## 2020-01-14 DIAGNOSIS — Z87820 Personal history of traumatic brain injury: Secondary | ICD-10-CM

## 2020-01-14 MED ORDER — LAMOTRIGINE 100 MG PO TABS: 200.0000 mg | ORAL_TABLET | Freq: Two times a day (BID) | ORAL | 1 refills | Status: DC

## 2020-01-14 MED ORDER — ALPRAZOLAM 0.5 MG PO TABS: 0.5000 mg | ORAL_TABLET | Freq: Three times a day (TID) | ORAL | 1 refills | Status: DC | PRN

## 2020-01-14 MED ORDER — TRAZODONE HCL 100 MG PO TABS: 100.0000 mg | ORAL_TABLET | Freq: Every evening | ORAL | 1 refills | Status: DC | PRN

## 2020-01-14 MED ORDER — BUPROPION HCL ER (XL) 150 MG PO TB24: 300.0000 mg | ORAL_TABLET | Freq: Every day | ORAL | 1 refills | Status: DC

## 2020-01-14 NOTE — Progress Notes (Signed)
[facility]  Patient ID: [name],  MRN: [id number]  PCP: Perlie Mayo, NP  Date of Evaluation: 01/15/2020 Time spent:40 minutes  Chief Complaint:  Chief Complaint    Depression; Anxiety; Insomnia      HISTORY/CURRENT STATUS: HPI Not doing well.  Presents 15 minutes late today, after being 30 minutes late for appt one day last week and was resched.   Tearfully says she's a mess. Anxiety is really bad. Saw a neurologist for tremor not long ago and was dx with essential tremor. That makes her anxious and then she has worsening of tremor. Is finding herself needing the Xanax more and more.  She has tried successfully over the past year or so to wean the Xanax and has done very well.  She eventually wants to get off and states she feels weak that she needs the Xanax more.  She met a wonderful man and thought things were going well, but now he has told her he only wants to be friends.  "That turned my world upside down."  She is very anxious now about the future, and it makes her really sad to know that she may live out the rest of her years alone.  She has trouble enjoying things.  She works at a [employer] but has trouble doing that.  Does not want to get out of bed a lot of the time.  She procrastinates about things even when she has plenty of time to do them.  Energy is low.  She cries easily and often.  Sleeps too much or not enough.  There is not much in between.  Trazodone does help her fall asleep when needed.  Hygiene is normal.  No suicidal or homicidal thoughts.  Just to note, she had a concussion in 1990 when a ladder fell on her head.  She saw a neurologist last month to discuss this as well as tremor, and she was diagnosed with essential tremor.  Also diagnosed with postural tremors, worse with stress.  Patient denies increased energy with decreased need for sleep, no increased talkativeness, no racing thoughts, no impulsivity or risky behaviors,  no increased spending, no increased libido, no grandiosity, no increased irritability or anger, and no hallucinations.  Denies dizziness, syncope, seizures, numbness, tingling, tremor, tics, unsteady gait, slurred speech, confusion. Denies muscle or joint pain, stiffness, or dystonia.  Individual Medical History/ Review of Systems: Changes? :No    Past medications for mental health diagnoses include: Effexor, Ativan, Paxil, Lithobid, Xanax, Lamictal, trazodone, Saphris, Zoloft, Seroquel, BuSpar  Allergies: Meperidine and 5-alpha reductase inhibitors  Current Medications:  Current Outpatient Medications:  .  ALPRAZolam (XANAX) 0.5 MG tablet, Take 1 tablet (0.5 mg total) by mouth 3 (three) times daily as needed for anxiety., Disp: 90 tablet, Rfl: 1 .  APPLE CIDER VINEGAR PO, Take 400 mg by mouth daily., Disp: , Rfl:  .  Ascorbic Acid (VITAMIN C) 1000 MG tablet, Take 1,000 mg by mouth daily., Disp: , Rfl:  .  buPROPion (WELLBUTRIN XL) 150 MG 24 hr tablet, Take 2 tablets (300 mg total) by mouth daily., Disp: 180 tablet, Rfl: 1 .  busPIRone (BUSPAR) 15 MG tablet, TAKE 1 TABLET BY MOUTH TWICE DAILY, Disp: 180 tablet, Rfl: 1 .  calcium elemental as carbonate (TUMS ULTRA 1000) 400 MG chewable tablet, Chew 1,000 mg by mouth daily as needed for heartburn., Disp: , Rfl:  .  Calcium-Magnesium-Zinc 333-133-5 MG TABS, Take 1 tablet by mouth daily., Disp: ,  Rfl:  .  Cholecalciferol (VITAMIN D3) 50 MCG (2000 UT) TABS, Take 2,000 Units by mouth daily., Disp: , Rfl:  .  Cyanocobalamin (VITAMIN B-12) 5000 MCG TBDP, Take 5,000 mg by mouth daily., Disp: , Rfl:  .  lamoTRIgine (LAMICTAL) 100 MG tablet, Take 2 tablets (200 mg total) by mouth 2 (two) times daily., Disp: 360 tablet, Rfl: 1 .  Omega-3 1000 MG CAPS, Take 1,000 mg by mouth daily., Disp: , Rfl:  .  traZODone (DESYREL) 100 MG tablet, Take 1 tablet (100 mg total) by mouth at bedtime as needed for sleep., Disp: 90 tablet, Rfl: 1 Medication Side Effects:  none  Family Medical/ Social History: Changes? See above   MENTAL HEALTH EXAM:  There were no vitals taken for this visit.There is no height or weight on file to calculate BMI.  General Appearance: Casual and Well Groomed  Eye Contact:  Good  Speech:  Clear and Coherent and Normal Rate  Volume:  Normal  Mood:  Anxious, Depressed and Hopeless  Affect:  Depressed and Anxious  Thought Process:  Goal Directed and Descriptions of Associations: Intact  Orientation:  Full (Time, Place, and Person)  Thought Content: Logical   Suicidal Thoughts:  No  Homicidal Thoughts:  No  Memory:  WNL  Judgement:  Good  Insight:  Good  Psychomotor Activity:  Very anxious and both hands are tremulous, seems more due to anxiety than essential tremor.  Tremors are at rest and with motion.  Concentration:  Concentration: Good  Recall:  Good  Fund of Knowledge: Good  Language: Good  Assets:  Desire for Improvement  ADL's:  Intact  Cognition: WNL  Prognosis:  Good    DIAGNOSES:    ICD-10-CM   1. Generalized anxiety disorder  F41.1   2. Depressed bipolar I disorder in partial remission (HCC)  F31.75   3. PTSD (post-traumatic stress disorder)  F43.10   4. Insomnia, unspecified type  G47.00   5. History of brain concussion  Z87.820   6. Tremor of both hands  R25.1     Receiving Psychotherapy: No     RECOMMENDATIONS:  PDMP was reviewed. I provided 40 minutes of face-to-face time as well as time spent reviewing her chart, specifically Dr. Gladstone Lighter (neurology) note. We had a long discussion concerning the anxiety.  Dr. Leta Baptist suggested that the Lamictal be increased and Wellbutrin decreased if possible.  I agree with this recommendation as Wellbutrin can cause anxiety or at least make it worse.  And increasing the Lamictal can help with depression as well as the postural seizures.  He mentioned propranolol or primidone if needed in the future, but stated she is already on a lot of medications and  he prefers not to add another medicine if not needed.  I concur. Discussed the anxiety and the fact that she has done well with weaning down on the Xanax.  However she needs it more right now so the benefit outweighs the risks.  We can always decrease it again when she is feeling better. Increase Xanax 0.5 mg to 1 p.o. 3 times daily as needed. Decrease Wellbutrin XL 150 mg to 2 p.o. every morning. Continue BuSpar 15 mg p.o. twice daily.  May need to increase this if anxiety does not respond to increasing the Lamictal.  It sometimes can help.  We can increase the BuSpar over the phone if needed. Increase Lamictal 100 mg to 2 p.o. twice daily. Continue trazodone 100 mg, 1 p.o. nightly. Recommend she get  back in counseling. Return in 4 to 6 weeks.  Donnal Moat, PA-C

## 2020-02-24 ENCOUNTER — Other Ambulatory Visit: Payer: Self-pay

## 2020-02-24 ENCOUNTER — Emergency Department (HOSPITAL_COMMUNITY): Payer: 59

## 2020-02-24 ENCOUNTER — Encounter (HOSPITAL_COMMUNITY): Payer: Self-pay | Admitting: Radiology

## 2020-02-24 ENCOUNTER — Emergency Department (HOSPITAL_COMMUNITY)
Admission: EM | Admit: 2020-02-24 | Discharge: 2020-02-24 | Disposition: A | Discharge status: Home / Self Care | Payer: 59 | Attending: Emergency Medicine | Admitting: Emergency Medicine

## 2020-02-24 DIAGNOSIS — R1013 Epigastric pain: Secondary | ICD-10-CM | POA: Insufficient documentation

## 2020-02-24 DIAGNOSIS — F17210 Nicotine dependence, cigarettes, uncomplicated: Secondary | ICD-10-CM | POA: Diagnosis not present

## 2020-02-24 DIAGNOSIS — R110 Nausea: Secondary | ICD-10-CM | POA: Diagnosis not present

## 2020-02-24 LAB — CBC WITH DIFFERENTIAL/PLATELET
Abs Immature Granulocytes: 0.03 10*3/uL (ref 0.00–0.07)
Basophils Absolute: 0 10*3/uL (ref 0.0–0.1)
Basophils Relative: 0 %
Eosinophils Absolute: 0.1 10*3/uL (ref 0.0–0.5)
Eosinophils Relative: 1 %
HCT: 44 % (ref 36.0–46.0)
Hemoglobin: 14.4 g/dL (ref 12.0–15.0)
Immature Granulocytes: 0 %
Lymphocytes Relative: 12 %
Lymphs Abs: 1.1 10*3/uL (ref 0.7–4.0)
MCH: 34.5 pg — ABNORMAL HIGH (ref 26.0–34.0)
MCHC: 32.7 g/dL (ref 30.0–36.0)
MCV: 105.5 fL — ABNORMAL HIGH (ref 80.0–100.0)
Monocytes Absolute: 0.9 10*3/uL (ref 0.1–1.0)
Monocytes Relative: 10 %
Neutro Abs: 7.1 10*3/uL (ref 1.7–7.7)
Neutrophils Relative %: 77 %
Platelets: 243 10*3/uL (ref 150–400)
RBC: 4.17 MIL/uL (ref 3.87–5.11)
RDW: 12.6 % (ref 11.5–15.5)
WBC: 9.2 10*3/uL (ref 4.0–10.5)
nRBC: 0 % (ref 0.0–0.2)

## 2020-02-24 LAB — COMPREHENSIVE METABOLIC PANEL
ALT: 63 U/L — ABNORMAL HIGH (ref 0–44)
AST: 123 U/L — ABNORMAL HIGH (ref 15–41)
Albumin: 4.3 g/dL (ref 3.5–5.0)
Alkaline Phosphatase: 94 U/L (ref 38–126)
Anion gap: 10 (ref 5–15)
BUN: 13 mg/dL (ref 8–23)
CO2: 25 mmol/L (ref 22–32)
Calcium: 10.2 mg/dL (ref 8.9–10.3)
Chloride: 103 mmol/L (ref 98–111)
Creatinine, Ser: 0.69 mg/dL (ref 0.44–1.00)
GFR, Estimated: >60 mL/min (ref >60)
Glucose, Bld: 105 mg/dL — ABNORMAL HIGH (ref 70–99)
Potassium: 3.9 mmol/L (ref 3.5–5.1)
Sodium: 138 mmol/L (ref 135–145)
Total Bilirubin: 0.6 mg/dL (ref 0.3–1.2)
Total Protein: 6.8 g/dL (ref 6.5–8.1)

## 2020-02-24 LAB — TROPONIN I (HIGH SENSITIVITY)
Troponin I (High Sensitivity): <2 ng/L (ref <18)
Troponin I (High Sensitivity): <2 ng/L (ref <18)

## 2020-02-24 LAB — LIPASE, BLOOD: Lipase: 33 U/L (ref 11–51)

## 2020-02-24 MED ORDER — LIDOCAINE VISCOUS HCL 2 % MT SOLN
15.0000 mL | Freq: Once | OROMUCOSAL | Status: DC
  Filled 2020-02-24: qty 15

## 2020-02-24 MED ORDER — IOHEXOL 300 MG/ML  SOLN
100.0000 mL | Freq: Once | INTRAMUSCULAR | Status: AC | PRN
  Administered 2020-02-24: 100 mL via INTRAVENOUS

## 2020-02-24 MED ORDER — PANTOPRAZOLE SODIUM 40 MG PO TBEC: 40.0000 mg | DELAYED_RELEASE_TABLET | Freq: Every day | ORAL | 0 refills | Status: DC

## 2020-02-24 MED ORDER — FENTANYL CITRATE (PF) 100 MCG/2ML IJ SOLN
25.0000 ug | Freq: Once | INTRAMUSCULAR | Status: AC
  Administered 2020-02-24: 25 ug via INTRAVENOUS
  Filled 2020-02-24: qty 2

## 2020-02-24 MED ORDER — PANTOPRAZOLE SODIUM 40 MG IV SOLR
40.0000 mg | Freq: Once | INTRAVENOUS | Status: AC
  Administered 2020-02-24: 40 mg via INTRAVENOUS
  Filled 2020-02-24: qty 40

## 2020-02-24 MED ORDER — ALUM & MAG HYDROXIDE-SIMETH 200-200-20 MG/5ML PO SUSP
30.0000 mL | Freq: Once | ORAL | Status: AC
  Administered 2020-02-24: 30 mL via ORAL
  Filled 2020-02-24: qty 30

## 2020-02-24 NOTE — ED Triage Notes (Signed)
Pt c/o central nonradiating chest pain that began at 0500 this morning.  Pt points to epigastric area and describes is as pain and pressure.

## 2020-02-24 NOTE — ED Provider Notes (Signed)
[facility] EMERGENCY DEPARTMENT Provider Note   CSN: [id number] Arrival date & time: 02/24/20  1219     History Chief Complaint  Patient presents with  . Chest Pain    [name] is a 64 y.o. female.  HPI      [name] is a 64 y.o. female who presents to the Emergency Department complaining of focal tenderness of the epigastric area that woke her from sleep at 5 AM this morning.  She describes a pressure type pain to this area that is constant.  She reports feeling minimal relief after drinking fluids and eating a small amount of food but states this improvement was very brief in duration.  She took Tums without relief.  She now complains of nausea associated with her pain.  She reports history of diverticulitis, but states this pain is different.  She denies any chest pain, shortness of breath, sweating neck or jaw pain.  No vomiting or back pain.   Past Medical History:  Diagnosis Date  . Anxiety   . Anxiety state 01/01/2018  . Bipolar 1 disorder ([place])   . Bipolar disorder ([place]) 12/12/2013  . Depression   . Diverticulitis   . Diverticulitis of colon 03/15/2016  . Insomnia 01/01/2018  . Tobacco abuse 06/25/2016  . Tubular adenoma 10/02/2018    Patient Active Problem List   Diagnosis Date Noted  . Sensory seizure ([place]) 01/02/2020  . History of sensory changes 01/02/2020  . Fatigue 01/02/2020  . Tremor of both hands 01/02/2020  . History of brain concussion 01/02/2020  . Depressed bipolar I disorder in partial remission ([place]) 01/01/2018    Past Surgical History:  Procedure Laterality Date  . COLONOSCOPY    . COLONOSCOPY N/A 08/08/2019   Procedure: COLONOSCOPY;  Surgeon: Rogene Houston, MD;  Location: [facility];  Service: Endoscopy;  Laterality: N/A;  220  . oophrorectomy    . POLYPECTOMY  08/08/2019   Procedure: POLYPECTOMY;  Surgeon: Rogene Houston, MD;  Location: [facility];  Service: Endoscopy;;  . TUBAL LIGATION       OB History     Gravida      Para      Term      Preterm      AB      Living  2     SAB      IAB      Ectopic      Multiple      Live Births              Family History  Problem Relation Age of Onset  . Cancer Mother        lichen planus - oral cancer  . Cancer Father        lung  . Cancer Maternal Uncle        uterine  . Diabetes Maternal Uncle   . Stroke Maternal Grandmother   . COPD Paternal Grandfather     Social History   Tobacco Use  . Smoking status: Current Every Day Smoker    Packs/day: 0.25    Types: Cigarettes    Last attempt to quit: 12/19/2017    Years since quitting: 2.1  . Smokeless tobacco: Never Used  . Tobacco comment: none in 9 months. after smoking 1 pack a day x 20 yrs.  Vaping Use  . Vaping Use: Never used  Substance Use Topics  . Alcohol use: Not Currently    Comment: week  .  Drug use: No    Home Medications Prior to Admission medications   Medication Sig Start Date End Date Taking? Authorizing Provider  acetaminophen (TYLENOL) 500 MG tablet Take 500 mg by mouth every 6 (six) hours as needed.   Yes [provider]  ALPRAZolam (XANAX) 0.5 MG tablet Take 1 tablet (0.5 mg total) by mouth 3 (three) times daily as needed for anxiety. 01/14/20  Yes Donnal Moat T, PA-C  amoxicillin (AMOXIL) 500 MG capsule Take 500 mg by mouth 3 (three) times daily. 02/04/20  Yes [provider]  APPLE CIDER VINEGAR PO Take 400 mg by mouth daily.   Yes [provider]  Ascorbic Acid (VITAMIN C) 1000 MG tablet Take 1,000 mg by mouth daily.   Yes [provider]  buPROPion (WELLBUTRIN XL) 150 MG 24 hr tablet Take 2 tablets (300 mg total) by mouth daily. 01/14/20  Yes Hurst, Helene Kelp T, PA-C  busPIRone (BUSPAR) 15 MG tablet TAKE 1 TABLET BY MOUTH TWICE DAILY 11/13/19  Yes Donnal Moat T, PA-C  calcium elemental as carbonate (TUMS ULTRA 1000) 400 MG chewable tablet Chew 1,000 mg by mouth daily as needed for heartburn.   Yes [provider]  Calcium-Magnesium-Zinc [phone] MG TABS Take 1 tablet by mouth daily.   Yes [provider]  Cholecalciferol (VITAMIN D3) 50 MCG (2000 UT) TABS Take 2,000 Units by mouth daily.   Yes [provider]  Cyanocobalamin (VITAMIN B-12) 5000 MCG TBDP Take 5,000 mg by mouth daily.   Yes [provider]  ibuprofen (ADVIL) 200 MG tablet Take 600 mg by mouth every 6 (six) hours as needed.   Yes [provider]  lamoTRIgine (LAMICTAL) 100 MG tablet Take 2 tablets (200 mg total) by mouth 2 (two) times daily. 01/14/20  Yes Hurst, Teresa T, PA-C  Omega-3 1000 MG CAPS Take 1,000 mg by mouth daily.   Yes [provider]  traZODone (DESYREL) 100 MG tablet Take 1 tablet (100 mg total) by mouth at bedtime as needed for sleep. 01/14/20  Yes Donnal Moat T, PA-C    Allergies    Meperidine and 5-alpha reductase inhibitors  Review of Systems   Review of Systems  Constitutional: Negative for appetite change, chills, diaphoresis and fever.  Respiratory: Negative for cough and shortness of breath.   Cardiovascular: Negative for chest pain.  Gastrointestinal: Positive for abdominal pain and nausea. Negative for blood in stool, diarrhea and vomiting.  Genitourinary: Negative for decreased urine volume, difficulty urinating, dysuria and flank pain.  Musculoskeletal: Negative for back pain.  Skin: Negative for color change and rash.  Neurological: Negative for dizziness, weakness and numbness.  Hematological: Negative for adenopathy.    Physical Exam Updated Vital Signs BP 113/73   Pulse (!) 57   Temp 98.2 F (36.8 C) (Oral)   Resp 15   Ht 5\' 7"  (1.702 m)   Wt 76.2 kg   SpO2 95%   BMI 26.31 kg/m   Physical Exam Vitals and nursing note reviewed.  Constitutional:      General: She is not in acute distress.    Appearance: Normal appearance. She is well-developed and well-nourished. She is not toxic-appearing or diaphoretic.  HENT:     Head:  Normocephalic and atraumatic.     Mouth/Throat:     Mouth: Oropharynx is clear and moist.  Cardiovascular:     Rate and Rhythm: Normal rate and regular rhythm.     Pulses: Normal pulses and intact distal pulses.  Pulmonary:  Effort: Pulmonary effort is normal. No respiratory distress.     Breath sounds: Normal breath sounds.  Abdominal:     General: Bowel sounds are normal. There is no distension.     Palpations: Abdomen is soft. There is no mass.     Tenderness: There is abdominal tenderness (Patient has focal tenderness to palpation of the epigastric area.  Abdomen is soft, no guarding or rebound.). There is no guarding or rebound.  Musculoskeletal:        General: No edema. Normal range of motion.  Skin:    General: Skin is warm.     Capillary Refill: Capillary refill takes less than 2 seconds.     Findings: No rash.  Neurological:     General: No focal deficit present.     Mental Status: She is alert.     Sensory: No sensory deficit.     Motor: No weakness or abnormal muscle tone.     ED Results / Procedures / Treatments   Labs (all labs ordered are listed, but only abnormal results are displayed) Labs Reviewed  COMPREHENSIVE METABOLIC PANEL - Abnormal; Notable for the following components:      Result Value   Glucose, Bld 105 (*)    AST 123 (*)    ALT 63 (*)    All other components within normal limits  CBC WITH DIFFERENTIAL/PLATELET - Abnormal; Notable for the following components:   MCV 105.5 (*)    MCH 34.5 (*)    All other components within normal limits  LIPASE, BLOOD  TROPONIN I (HIGH SENSITIVITY)  TROPONIN I (HIGH SENSITIVITY)    EKG EKG Interpretation  Date/Time:  Sunday February 24 2020 13:36:22 EST Ventricular Rate:  66 PR Interval:    QRS Duration: 116 QT Interval:  418 QTC Calculation: 438 R Axis:   -73 Text Interpretation: Sinus rhythm LAD, consider left anterior fascicular block Low voltage, extremity and precordial leads Nonspecific T  wave abnormality Confirmed by Lajean Saver [phone]) on 02/24/2020 1:40:26 PM   Radiology CT ABDOMEN PELVIS W CONTRAST  Result Date: 02/24/2020 CLINICAL DATA:  Epigastric pain. Central nonradiating chest pain. History of diverticulitis. EXAM: CT ABDOMEN AND PELVIS WITH CONTRAST TECHNIQUE: Multidetector CT imaging of the abdomen and pelvis was performed using the standard protocol following bolus administration of intravenous contrast. CONTRAST:  131mL OMNIPAQUE IOHEXOL 300 MG/ML  SOLN COMPARISON:  12/11/2018 FINDINGS: Lower chest: The lung bases are clear. The heart size is normal. Hepatobiliary: The liver is normal. Normal gallbladder.There is no biliary ductal dilation. Pancreas: Normal contours without ductal dilatation. No peripancreatic fluid collection. Spleen: Unremarkable. Adrenals/Urinary Tract: --Adrenal glands: Unremarkable. --Right kidney/ureter: No hydronephrosis or radiopaque kidney stones. --Left kidney/ureter: No hydronephrosis or radiopaque kidney stones. --Urinary bladder: Unremarkable. Stomach/Bowel: --Stomach/Duodenum: No hiatal hernia or other gastric abnormality. Normal duodenal course and caliber. --Small bowel: Unremarkable. --Colon: Unremarkable. --Appendix: Normal. Vascular/Lymphatic: Atherosclerotic calcification is present within the non-aneurysmal abdominal aorta, without hemodynamically significant stenosis. --No retroperitoneal lymphadenopathy. --No mesenteric lymphadenopathy. --No pelvic or inguinal lymphadenopathy. Reproductive: Unremarkable Other: No ascites or free air. The abdominal wall is normal. Musculoskeletal. No acute displaced fractures. IMPRESSION: No CT findings to explain the patient's epigastric pain. Aortic Atherosclerosis (ICD10-I70.0). Electronically Signed   By: Constance Holster M.D.   On: 02/24/2020 15:44   DG Chest Portable 1 View  Result Date: 02/24/2020 CLINICAL DATA:  Midsternal pain EXAM: PORTABLE CHEST 1 VIEW COMPARISON:  December 11, 2018  FINDINGS: The cardiomediastinal silhouette is unchanged in contour. No pleural effusion. No  pneumothorax. Bibasilar reticulonodular opacities, mildly increased in comparison to prior. Visualized abdomen is unremarkable. Multilevel degenerative changes of the thoracic spine. IMPRESSION: Bibasilar reticulonodular opacities, mildly increased in comparison to prior. Findings likely reflect increased atelectasis although superimposed infection remains in the differential. Electronically Signed   By: Valentino Saxon MD   On: 02/24/2020 14:03    Procedures Procedures (including critical care time)  Medications Ordered in ED Medications  fentaNYL (SUBLIMAZE) injection 25 mcg (25 mcg Intravenous Given 02/24/20 1347)  pantoprazole (PROTONIX) injection 40 mg (40 mg Intravenous Given 02/24/20 1342)  iohexol (OMNIPAQUE) 300 MG/ML solution 100 mL (100 mLs Intravenous Contrast Given 02/24/20 1517)    ED Course  I have reviewed the triage vital signs and the nursing notes.  Pertinent labs & imaging results that were available during my care of the patient were reviewed by me and considered in my medical decision making (see chart for details).    MDM Rules/Calculators/A&P                          Patient here with epigastric pain.  No vomiting, chest pain or shortness of breath.  Symptoms of sudden onset earlier this morning.  Accompanied by nausea, no vomiting.  No fever or recent illness.  On exam she has localized tenderness of the epigastric area.  Overall, work-up today is reassuring.  CT abdomen and pelvis without acute findings.  Specifically, no evidence of gallbladder disease.  Labs show mildly elevated transaminases, lipase and bilirubin unremarkable.  Patient feeling better after receiving pain medication, PPI, GI cocktail also given.  Low clinical suspicion for cardiac disease, EKG without acute changes, delta troponin reassuring.  Chest x-ray shows bibasilar opacities that are felt to be  representative of atelectasis given that patient has no complaints of fever, dyspnea or cough.  I feel that she is appropriate for discharge home at this time.  I have scheduled her for outpatient ultrasound of her gallbladder for tomorrow and will start her on a PPI.  She is seen Dr. Dereck Leep previously and prefers to follow-up with his office this week.  Return precautions discussed.  The patient appears reasonably screened and/or stabilized for discharge and I doubt any other medical condition or other [facility] requiring further screening, evaluation, or treatment in the ED at this time prior to discharge.  Final Clinical Impression(s) / ED Diagnoses Final diagnoses:  Epigastric pain    Rx / DC Orders ED Discharge Orders    None       Kem Parkinson, PA-C 02/24/20 1731    Lajean Saver, MD 02/25/20 [phone]

## 2020-02-24 NOTE — Discharge Instructions (Addendum)
You have been scheduled for an outpatient ultrasound of your gallbladder.  Please call the scheduling department at [phone] to schedule an appointment time for your ultrasound tomorrow.  Start the medication prescribed today and take as directed.  Avoid spicy or greasy foods and alcohol.  Call Dr. Ewell Poe office tomorrow to arrange a follow-up appointment. Return to ER for any worsening symptoms

## 2020-02-25 ENCOUNTER — Ambulatory Visit: Payer: Self-pay | Admitting: Physician Assistant

## 2020-02-25 ENCOUNTER — Ambulatory Visit (HOSPITAL_COMMUNITY)
Admission: RE | Admit: 2020-02-25 | Discharge: 2020-02-25 | Disposition: A | Discharge status: Home / Self Care | Payer: 59 | Source: Ambulatory Visit | Attending: Emergency Medicine | Admitting: Emergency Medicine

## 2020-02-25 DIAGNOSIS — R1013 Epigastric pain: Secondary | ICD-10-CM | POA: Diagnosis not present

## 2020-02-25 DIAGNOSIS — R110 Nausea: Secondary | ICD-10-CM | POA: Diagnosis not present

## 2020-02-25 NOTE — ED Provider Notes (Signed)
Patient was evaluated yesterday in the ER for epigastric pain.  Right upper quadrant ultrasound was obtained this morning and was negative.  I discussed findings with the patient.  She will follow up with gastroenterology, per AVS.  She is in no acute distress and ambulating without difficulty.  Continues to endorse epigastric discomfort, suspects that it is GERD.    Corena Herter, PA-C 02/25/20 1255    Margette Fast, MD 02/26/20 [phone]

## 2020-03-25 ENCOUNTER — Ambulatory Visit
Admission: EM | Admit: 2020-03-25 | Discharge: 2020-03-25 | Disposition: A | Discharge status: Home / Self Care | Payer: 59 | Attending: Family Medicine | Admitting: Family Medicine

## 2020-03-25 ENCOUNTER — Encounter: Payer: Self-pay | Admitting: Emergency Medicine

## 2020-03-25 ENCOUNTER — Other Ambulatory Visit: Payer: Self-pay

## 2020-03-25 DIAGNOSIS — B349 Viral infection, unspecified: Secondary | ICD-10-CM | POA: Diagnosis not present

## 2020-03-25 DIAGNOSIS — R5383 Other fatigue: Secondary | ICD-10-CM

## 2020-03-25 DIAGNOSIS — R059 Cough, unspecified: Secondary | ICD-10-CM | POA: Diagnosis not present

## 2020-03-25 DIAGNOSIS — R0789 Other chest pain: Secondary | ICD-10-CM

## 2020-03-25 DIAGNOSIS — R509 Fever, unspecified: Secondary | ICD-10-CM

## 2020-03-25 DIAGNOSIS — R52 Pain, unspecified: Secondary | ICD-10-CM

## 2020-03-25 MED ORDER — BENZONATATE 100 MG PO CAPS: 100.0000 mg | ORAL_CAPSULE | Freq: Three times a day (TID) | ORAL | 0 refills | Status: DC

## 2020-03-25 NOTE — ED Provider Notes (Signed)
[place]   [id number] 03/25/20 Arrival Time: 3762   CC: COVID symptoms  SUBJECTIVE: History from: patient  .  [name] is a 65 y.o. female who presents with body aches, fatigue, fever, cough for the last 3 days. Denies sick exposure to COVID, flu or strep. Denies recent travel. Has negative history of Covid. Has not completed Covid vaccines. Has not had flu vaccine this year. Has not taken OTC medications for this. There are no aggravating or alleviating factors. Denies previous symptoms in the past. Denies  sinus pain, rhinorrhea, sore throat, SOB, wheezing, chest pain, nausea, changes in bowel or bladder habits.    ROS: As per HPI.  All other pertinent ROS negative.     Past Medical History:  Diagnosis Date  . Anxiety   . Anxiety state 01/01/2018  . Bipolar 1 disorder ([place])   . Bipolar disorder ([place]) 12/12/2013  . Depression   . Diverticulitis   . Diverticulitis of colon 03/15/2016  . Insomnia 01/01/2018  . Tobacco abuse 06/25/2016  . Tubular adenoma 10/02/2018   Past Surgical History:  Procedure Laterality Date  . COLONOSCOPY    . COLONOSCOPY N/A 08/08/2019   Procedure: COLONOSCOPY;  Surgeon: Rogene Houston, MD;  [facility];  Service: Endoscopy;  Laterality: N/A;  220  . oophrorectomy    . POLYPECTOMY  08/08/2019   Procedure: POLYPECTOMY;  Surgeon: Rogene Houston, MD;  Location: [facility];  Service: Endoscopy;;  . TUBAL LIGATION     Allergies  Allergen Reactions  . Meperidine Hives  . 5-Alpha Reductase Inhibitors    No current facility-administered medications on file prior to encounter.   Current Outpatient Medications on File Prior to Encounter  Medication Sig Dispense Refill  . acetaminophen (TYLENOL) 500 MG tablet Take 500 mg by mouth every 6 (six) hours as needed.    . ALPRAZolam (XANAX) 0.5 MG tablet Take 1 tablet (0.5 mg total) by mouth 3 (three) times daily as needed for anxiety. 90 tablet 1  . amoxicillin (AMOXIL) 500  MG capsule Take 500 mg by mouth 3 (three) times daily.    . APPLE CIDER VINEGAR PO Take 400 mg by mouth daily.    . Ascorbic Acid (VITAMIN C) 1000 MG tablet Take 1,000 mg by mouth daily.    Marland Kitchen buPROPion (WELLBUTRIN XL) 150 MG 24 hr tablet Take 2 tablets (300 mg total) by mouth daily. 180 tablet 1  . busPIRone (BUSPAR) 15 MG tablet TAKE 1 TABLET BY MOUTH TWICE DAILY 180 tablet 1  . calcium elemental as carbonate (TUMS ULTRA 1000) 400 MG chewable tablet Chew 1,000 mg by mouth daily as needed for heartburn.    . Calcium-Magnesium-Zinc [id number] MG TABS Take 1 tablet by mouth daily.    . Cholecalciferol (VITAMIN D3) 50 MCG (2000 UT) TABS Take 2,000 Units by mouth daily.    . Cyanocobalamin (VITAMIN B-12) 5000 MCG TBDP Take 5,000 mg by mouth daily.    Marland Kitchen ibuprofen (ADVIL) 200 MG tablet Take 600 mg by mouth every 6 (six) hours as needed.    . lamoTRIgine (LAMICTAL) 100 MG tablet Take 2 tablets (200 mg total) by mouth 2 (two) times daily. 360 tablet 1  . Omega-3 1000 MG CAPS Take 1,000 mg by mouth daily.    . pantoprazole (PROTONIX) 40 MG tablet Take 1 tablet (40 mg total) by mouth daily. 30 tablet 0  . traZODone (DESYREL) 100 MG tablet Take 1 tablet (100 mg total) by mouth at  bedtime as needed for sleep. 90 tablet 1   Social History   Socioeconomic History  . Marital status: Divorced    Spouse name: Not on file  . Number of children: 2  . Years of education: 31  . Highest education level: Not on file  Occupational History  . Occupation: Psychologist, 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[name], [place]  Tobacco Use  . Smoking status: Current Every Day Smoker    Packs/day: 0.25    Types: Cigarettes    Last attempt to quit: 12/19/2017    Years since quitting: 2.2  . Smokeless tobacco: Never Used  . Tobacco comment: none in 9 months. after smoking 1 pack a day x 20 yrs.  Vaping Use  . Vaping Use: Never used  Substance and Sexual Activity  . Alcohol use: Not Currently    Comment: week  . Drug use:  No  . Sexual activity: Not Currently    Birth control/protection: Post-menopausal  Other Topics Concern  . Not on file  Social History Narrative   Lives alone   She has 2 sons that are close by.      She enjoys going out to dinner and spend time with family.      Diet reports eating all foods.   Caffeine none   Water 6 to 8 cups regularly daily.      Social Determinants of Health   Financial Resource Strain: Not on file  Food Insecurity: Not on file  Transportation Needs: Not on file  Physical Activity: Not on file  Stress: Not on file  Social Connections: Not on file  Intimate Partner Violence: Not on file   Family History  Problem Relation Age of Onset  . Cancer Mother        lichen planus - oral cancer  . Cancer Father        lung  . Cancer Maternal Uncle        uterine  . Diabetes Maternal Uncle   . Stroke Maternal Grandmother   . COPD Paternal Grandfather     OBJECTIVE:  Vitals:   03/25/20 1617 03/25/20 1618  BP:  114/78  Pulse:  73  Resp:  19  Temp:  97.9 F (36.6 C)  TempSrc:  Oral  SpO2:  93%  Weight: 168 lb (76.2 kg)   Height: 5\' 7"  (1.702 m)      General appearance: alert; appears fatigued, but nontoxic; speaking in full sentences and tolerating own secretions HEENT: NCAT; Ears: EACs clear, TMs pearly gray; Eyes: PERRL.  EOM grossly intact. Sinuses: nontender; Nose: nares patent with clear rhinorrhea, Throat: oropharynx erythematous, cobblestoning present, tonsils non erythematous or enlarged, uvula midline  Neck: supple without LAD Lungs: unlabored respirations, symmetrical air entry; cough: mild; no respiratory distress; CTAB Heart: regular rate and rhythm.  Radial pulses 2+ symmetrical bilaterally Skin: warm and dry Psychological: alert and cooperative; normal mood and affect  LABS:  No results found for this or any previous visit (from the past 24 hour(s)).   ASSESSMENT & PLAN:  1. Viral illness   2. Cough   3. Body aches   4.  Fever, unspecified fever cause   5. Other fatigue   6. Chest tightness     Meds ordered this encounter  Medications  . benzonatate (TESSALON) 100 MG capsule    Sig: Take 1 capsule (100 mg total) by mouth every 8 (eight) hours.    Dispense:  21 capsule    Refill:  0  Order Specific Question:   Supervising Provider    Answer:   Chase Picket [[id number]]   Prescribed tessalon perles for cough Continue supportive care at home COVID and flu testing ordered.  It will take between 2-3 days for test results. Someone will contact you regarding abnormal results.   Work note provided Patient should remain in quarantine until they have received Covid results.  If negative you may resume normal activities (go back to work/school) while practicing hand hygiene, social distance, and mask wearing.  If positive, patient should remain in quarantine for at least 5 days from symptom onset AND greater than 72 hours after symptoms resolution (absence of fever without the use of fever-reducing medication and improvement in respiratory symptoms), whichever is longer Get plenty of rest and push fluids Use OTC zyrtec for nasal congestion, runny nose, and/or sore throat Use OTC flonase for nasal congestion and runny nose Use medications daily for symptom relief Use OTC medications like ibuprofen or tylenol as needed fever or pain Call or go to the ED if you have any new or worsening symptoms such as fever, worsening cough, shortness of breath, chest tightness, chest pain, turning blue, changes in mental status.  Reviewed expectations re: course of current medical issues. Questions answered. Outlined signs and symptoms indicating need for more acute intervention. Patient verbalized understanding. After Visit Summary given.         Faustino Congress, NP 03/27/20 1046

## 2020-03-25 NOTE — ED Triage Notes (Addendum)
Achy, fatigue, fever last week, eyes feel puffy when she wakes up in the morning.  Coughing today

## 2020-03-25 NOTE — Discharge Instructions (Addendum)
Your COVID test is pending.  You should self quarantine until the test result is back.    Take Tylenol or ibuprofen as needed for fever or discomfort.  Rest and keep yourself hydrated.    Follow-up with your primary care provider if your symptoms are not improving.     

## 2020-03-27 LAB — COVID-19, FLU A+B NAA
Influenza A, NAA: NOT DETECTED
Influenza B, NAA: NOT DETECTED
SARS-CoV-2, NAA: NOT DETECTED

## 2020-04-01 ENCOUNTER — Ambulatory Visit: Payer: 59 | Admitting: Family Medicine

## 2020-04-03 ENCOUNTER — Other Ambulatory Visit: Payer: Self-pay | Admitting: Physician Assistant

## 2020-04-03 ENCOUNTER — Telehealth: Payer: Self-pay | Admitting: Physician Assistant

## 2020-04-03 MED ORDER — BUPROPION HCL ER (XL) 300 MG PO TB24: 300.0000 mg | ORAL_TABLET | Freq: Every day | ORAL | 0 refills | Status: DC

## 2020-04-03 NOTE — Telephone Encounter (Signed)
Next appt is 05/28/20. Requesting refill on Wellbutrin. She is currently on 2/day. Insurance will only pay for 1/day. Can she take 1/day instead of 2? Pharmacy is [facility] on [address], [place], [place]. Phone # [phone].

## 2020-04-03 NOTE — Telephone Encounter (Signed)
Prescription for Wellbutrin XL 300 mg, 1 p.o. daily was sent in.

## 2020-04-04 ENCOUNTER — Ambulatory Visit: Payer: 59 | Admitting: Nurse Practitioner

## 2020-04-24 ENCOUNTER — Other Ambulatory Visit: Payer: Self-pay | Admitting: Physician Assistant

## 2020-04-29 ENCOUNTER — Ambulatory Visit: Payer: 59 | Admitting: Nurse Practitioner

## 2020-05-09 ENCOUNTER — Other Ambulatory Visit: Payer: Self-pay | Admitting: Physician Assistant

## 2020-05-13 ENCOUNTER — Ambulatory Visit: Payer: 59 | Admitting: Nurse Practitioner

## 2020-05-13 ENCOUNTER — Other Ambulatory Visit: Payer: Self-pay

## 2020-05-14 ENCOUNTER — Encounter: Payer: Self-pay | Admitting: Nurse Practitioner

## 2020-05-14 ENCOUNTER — Other Ambulatory Visit: Payer: Self-pay

## 2020-05-14 ENCOUNTER — Ambulatory Visit (INDEPENDENT_AMBULATORY_CARE_PROVIDER_SITE_OTHER): Payer: 59 | Admitting: Nurse Practitioner

## 2020-05-14 VITALS — BP 107/68 | HR 80 | Temp 98.3°F | Resp 20 | Ht 67.0 in | Wt 165.0 lb

## 2020-05-14 DIAGNOSIS — F3175 Bipolar disorder, in partial remission, most recent episode depressed: Secondary | ICD-10-CM

## 2020-05-14 DIAGNOSIS — E612 Magnesium deficiency: Secondary | ICD-10-CM | POA: Diagnosis not present

## 2020-05-14 DIAGNOSIS — R251 Tremor, unspecified: Secondary | ICD-10-CM | POA: Diagnosis not present

## 2020-05-14 DIAGNOSIS — G4089 Other seizures: Secondary | ICD-10-CM | POA: Diagnosis not present

## 2020-05-14 DIAGNOSIS — Z139 Encounter for screening, unspecified: Secondary | ICD-10-CM

## 2020-05-14 DIAGNOSIS — E538 Deficiency of other specified B group vitamins: Secondary | ICD-10-CM

## 2020-05-14 DIAGNOSIS — Z0000 Encounter for general adult medical examination without abnormal findings: Secondary | ICD-10-CM

## 2020-05-14 DIAGNOSIS — E559 Vitamin D deficiency, unspecified: Secondary | ICD-10-CM | POA: Insufficient documentation

## 2020-05-14 MED ORDER — BUPROPION HCL ER (XL) 150 MG PO TB24: 150.0000 mg | ORAL_TABLET | Freq: Every day | ORAL | 1 refills | Status: DC

## 2020-05-14 MED ORDER — PANTOPRAZOLE SODIUM 40 MG PO TBEC: 40.0000 mg | DELAYED_RELEASE_TABLET | Freq: Every day | ORAL | 1 refills | Status: DC

## 2020-05-14 NOTE — Progress Notes (Signed)
Established Patient Office Visit  Subjective:  Patient ID: [name], female    DOB: 1955-07-21  Age: 65 y.o. MRN: [id number]  CC:  Chief Complaint  Patient presents with  . Follow-up    Tremors, still having intermittent spells of tremors.     HPI [name] presents for follow-up visit. She was seen for new patient visit on 01/01/20. She had a head injury in the 1990s, and she had had sensory seizures since then. Her left side has been affected more.  Past Medical History:  Diagnosis Date  . Anxiety   . Anxiety state 01/01/2018  . Bipolar 1 disorder ([place])   . Bipolar disorder ([place]) 12/12/2013  . Depression   . Diverticulitis   . Diverticulitis of colon 03/15/2016  . Insomnia 01/01/2018  . Tobacco abuse 06/25/2016  . Tubular adenoma 10/02/2018    Past Surgical History:  Procedure Laterality Date  . COLONOSCOPY    . COLONOSCOPY N/A 08/08/2019   Procedure: COLONOSCOPY;  Surgeon: Rogene Houston, MD;  [facility];  Service: Endoscopy;  Laterality: N/A;  220  . oophrorectomy    . POLYPECTOMY  08/08/2019   Procedure: POLYPECTOMY;  Surgeon: Rogene Houston, MD;  [facility]: [facility];  Service: Endoscopy;;  . TUBAL LIGATION      Family History  Problem Relation Age of Onset  . Cancer Mother        lichen planus - oral cancer  . Cancer Father        lung  . Cancer Maternal Uncle        uterine  . Diabetes Maternal Uncle   . Stroke Maternal Grandmother   . COPD Paternal Grandfather     Social History   Socioeconomic History  . Marital status: Divorced    Spouse name: Not on file  . Number of children: 2  . Years of education: 98  . Highest education level: Not on file  Occupational History  . Occupation: Psychologist, 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[employer], [place]  Tobacco Use  . Smoking status: Current Every Day Smoker    Packs/day: 0.25    Types: Cigarettes    Last attempt to quit: 12/19/2017    Years since quitting: 2.4  .  Smokeless tobacco: Never Used  . Tobacco comment: none in 9 months. after smoking 1 pack a day x 20 yrs.  Vaping Use  . Vaping Use: Never used  Substance and Sexual Activity  . Alcohol use: Not Currently    Comment: week  . Drug use: No  . Sexual activity: Not Currently    Birth control/protection: Post-menopausal  Other Topics Concern  . Not on file  Social History Narrative   Lives alone   She has 2 sons that are close by.      She enjoys going out to dinner and spend time with family.      Diet reports eating all foods.   Caffeine none   Water 6 to 8 cups regularly daily.      Social Determinants of Health   Financial Resource Strain: Not on file  Food Insecurity: Not on file  Transportation Needs: Not on file  Physical Activity: Not on file  Stress: Not on file  Social Connections: Not on file  Intimate Partner Violence: Not on file    Outpatient Medications Prior to Visit  Medication Sig Dispense Refill  . acetaminophen (TYLENOL) 500 MG tablet Take 500 mg by mouth every 6 (  six) hours as needed.    . ALPRAZolam (XANAX) 0.5 MG tablet TAKE 1 TABLET BY MOUTH TWICE DAILY AS NEEDED FOR ANXIETY 60 tablet 0  . Ascorbic Acid (VITAMIN C) 1000 MG tablet Take 1,000 mg by mouth daily.    . busPIRone (BUSPAR) 15 MG tablet TAKE 1 TABLET BY MOUTH TWICE DAILY 180 tablet 0  . calcium elemental as carbonate (TUMS ULTRA 1000) 400 MG chewable tablet Chew 1,000 mg by mouth daily as needed for heartburn.    . Calcium-Magnesium-Zinc 333-133-5 MG TABS Take 1 tablet by mouth daily.    . Cholecalciferol (VITAMIN D3) 50 MCG (2000 UT) TABS Take 2,000 Units by mouth daily.    . Cyanocobalamin (VITAMIN B-12) 5000 MCG TBDP Take 5,000 mg by mouth daily.    Marland Kitchen ibuprofen (ADVIL) 200 MG tablet Take 600 mg by mouth every 6 (six) hours as needed.    . lamoTRIgine (LAMICTAL) 100 MG tablet Take 2 tablets (200 mg total) by mouth 2 (two) times daily. 360 tablet 1  . Omega-3 1000 MG CAPS Take 1,000 mg by  mouth daily.    . traZODone (DESYREL) 100 MG tablet Take 1 tablet (100 mg total) by mouth at bedtime as needed for sleep. 90 tablet 1  . amoxicillin (AMOXIL) 500 MG capsule Take 500 mg by mouth 3 (three) times daily.    . APPLE CIDER VINEGAR PO Take 400 mg by mouth daily.    . benzonatate (TESSALON) 100 MG capsule Take 1 capsule (100 mg total) by mouth every 8 (eight) hours. 21 capsule 0  . buPROPion (WELLBUTRIN XL) 300 MG 24 hr tablet Take 1 tablet (300 mg total) by mouth daily. 90 tablet 0  . pantoprazole (PROTONIX) 40 MG tablet Take 1 tablet (40 mg total) by mouth daily. 30 tablet 0   No facility-administered medications prior to visit.    Allergies  Allergen Reactions  . Meperidine Hives  . 5-Alpha Reductase Inhibitors     ROS Review of Systems    Objective:    Physical Exam  BP 107/68   Pulse 80   Temp 98.3 F (36.8 C)   Resp 20   Ht 5' 7" (1.702 m)   Wt 165 lb (74.8 kg)   SpO2 90%   BMI 25.84 kg/m  Wt Readings from Last 3 Encounters:  05/14/20 165 lb (74.8 kg)  03/25/20 168 lb (76.2 kg)  02/24/20 168 lb (76.2 kg)     Health Maintenance Due  Topic Date Due  . Hepatitis C Screening  Never done  . HIV Screening  Never done  . TETANUS/TDAP  Never done  . PAP SMEAR-Modifier  Never done  . MAMMOGRAM  Never done  . COVID-19 Vaccine (2 - Pfizer 3-dose series) 12/08/2019    There are no preventive care reminders to display for this patient.  Lab Results  Component Value Date   TSH 1.340 01/01/2020   Lab Results  Component Value Date   WBC 9.2 02/24/2020   HGB 14.4 02/24/2020   HCT 44.0 02/24/2020   MCV 105.5 (H) 02/24/2020   PLT 243 02/24/2020   Lab Results  Component Value Date   NA 138 02/24/2020   K 3.9 02/24/2020   CO2 25 02/24/2020   GLUCOSE 105 (H) 02/24/2020   BUN 13 02/24/2020   CREATININE 0.69 02/24/2020   BILITOT 0.6 02/24/2020   ALKPHOS 94 02/24/2020   AST 123 (H) 02/24/2020   ALT 63 (H) 02/24/2020   PROT 6.8 02/24/2020  ALBUMIN  4.3 02/24/2020   CALCIUM 10.2 02/24/2020   ANIONGAP 10 02/24/2020   No results found for: CHOL No results found for: HDL No results found for: LDLCALC No results found for: TRIG No results found for: CHOLHDL Lab Results  Component Value Date   HGBA1C 5.3 01/01/2020      Assessment & Plan:   Problem List Items Addressed This Visit      Nervous and Auditory   Sensory seizure ([place])    -followed by neurology -recently decreased her wellbutrin dose and increased her lamictal; med list should be current        Other   Depressed bipolar I disorder in partial remission ([place])    -followed by psychiatry      Tremor of both hands    -followed by neurology -has improved since changing her meds -if she has lots of tremors, she takes half of a xanax, and that has been helping; if she needs xanax for tremors, she takes less at bedtime      B12 deficiency    -takes B12 supplement; will check labs today      Relevant Orders   B12   Vitamin D deficiency    -takes vit D supplement -will check with labs      Relevant Orders   VITAMIN D 25 Hydroxy (Vit-D Deficiency, Fractures)   Screening due    -will screen for HCV and HIV with next set of labs      Relevant Orders   HCV Ab w/Rflx to Verification   HIV Antibody (routine testing w rflx)    Other Visit Diagnoses    Magnesium deficiency    -  Primary   Relevant Orders   Magnesium   Preventative health care       Relevant Orders   CBC with Differential/Platelet   CMP14+EGFR   Lipid Panel With LDL/HDL Ratio      Meds ordered this encounter  Medications  . buPROPion (WELLBUTRIN XL) 150 MG 24 hr tablet    Sig: Take 1 tablet (150 mg total) by mouth daily.    Dispense:  90 tablet    Refill:  1    Updating med list; no need to fill today (05/14/20)  . pantoprazole (PROTONIX) 40 MG tablet    Sig: Take 1 tablet (40 mg total) by mouth daily.    Dispense:  90 tablet    Refill:  1    Follow-up: Return in about 6 months  (around 11/14/2020) for Physical exam.    Noreene Larsson, NP

## 2020-05-14 NOTE — Assessment & Plan Note (Signed)
-  will screen for HCV and HIV with next set of labs 

## 2020-05-14 NOTE — Assessment & Plan Note (Signed)
-  followed by neurology -has improved since changing her meds -if she has lots of tremors, she takes half of a xanax, and that has been helping; if she needs xanax for tremors, she takes less at bedtime

## 2020-05-14 NOTE — Assessment & Plan Note (Signed)
-  takes B12 supplement; will check labs today

## 2020-05-14 NOTE — Assessment & Plan Note (Signed)
-  followed by neurology -recently decreased her wellbutrin dose and increased her lamictal; med list should be current

## 2020-05-14 NOTE — Assessment & Plan Note (Signed)
-  followed by psychiatry

## 2020-05-14 NOTE — Patient Instructions (Signed)
Please return to clinic Friday, and we will check fasting labs.   We will meet back up in 6 months, unless lab results indicate that an appointment is needed sooner.

## 2020-05-14 NOTE — Assessment & Plan Note (Signed)
-  takes vit D supplement -will check with labs

## 2020-05-23 ENCOUNTER — Telehealth: Payer: Self-pay

## 2020-05-23 ENCOUNTER — Other Ambulatory Visit: Payer: Self-pay

## 2020-05-23 DIAGNOSIS — Z139 Encounter for screening, unspecified: Secondary | ICD-10-CM

## 2020-05-23 NOTE — Telephone Encounter (Signed)
Patient called requesting a referral be sent to [facility] she has an appointment scheduled with them for Monday ph# [phone]

## 2020-05-23 NOTE — Telephone Encounter (Signed)
Referral order sent 

## 2020-05-28 ENCOUNTER — Ambulatory Visit: Payer: 59 | Admitting: Physician Assistant

## 2020-05-28 ENCOUNTER — Other Ambulatory Visit: Payer: Self-pay

## 2020-06-10 ENCOUNTER — Other Ambulatory Visit: Payer: Self-pay | Admitting: Physician Assistant

## 2020-06-10 NOTE — Telephone Encounter (Signed)
Pt wants three a day instead of two a day. Please send in three

## 2020-06-10 NOTE — Telephone Encounter (Signed)
Controlled substance 

## 2020-06-11 ENCOUNTER — Telehealth: Payer: Self-pay | Admitting: Physician Assistant

## 2020-06-11 ENCOUNTER — Other Ambulatory Visit: Payer: Self-pay | Admitting: Nurse Practitioner

## 2020-06-11 MED ORDER — ATORVASTATIN CALCIUM 20 MG PO TABS: 20.0000 mg | ORAL_TABLET | Freq: Every day | ORAL | 3 refills | Status: DC

## 2020-06-11 NOTE — Progress Notes (Signed)
Sent in atorvastatin for high cholesterol. We can discuss this at her appt on 4/8.

## 2020-06-11 NOTE — Telephone Encounter (Signed)
Please review

## 2020-06-11 NOTE — Telephone Encounter (Signed)
PT has said that [facility] told her they filled the Xanax last on 05/09/20 for 2/day,  but she didn't pick it up until 05/14/20. So they are telling her she cannot get the new Xanax RX ( for 3 a day) until Friday. This is a change in dosage. She does not recall why she would of picked up the Xanax late b/c she needs it daily to not go into W/D. She is asking for 3 pills to get her to Friday when she can get it filled again. [facility] in [place], [place].

## 2020-06-11 NOTE — Telephone Encounter (Signed)
Showed they filled her Rx on 05/13/2020 for #60

## 2020-06-11 NOTE — Progress Notes (Signed)
Lab Results  Component Value Date   CHOL 214 (H) 06/10/2020   HDL 70 06/10/2020   LDLCALC 127 (H) 06/10/2020   TRIG 94 06/10/2020   Rx. Atorvastatin for HLD

## 2020-06-12 ENCOUNTER — Other Ambulatory Visit: Payer: Self-pay

## 2020-06-12 ENCOUNTER — Ambulatory Visit (INDEPENDENT_AMBULATORY_CARE_PROVIDER_SITE_OTHER): Payer: 59 | Admitting: Nurse Practitioner

## 2020-06-12 ENCOUNTER — Encounter: Payer: Self-pay | Admitting: Nurse Practitioner

## 2020-06-12 DIAGNOSIS — E559 Vitamin D deficiency, unspecified: Secondary | ICD-10-CM

## 2020-06-12 DIAGNOSIS — F419 Anxiety disorder, unspecified: Secondary | ICD-10-CM

## 2020-06-12 DIAGNOSIS — E785 Hyperlipidemia, unspecified: Secondary | ICD-10-CM

## 2020-06-12 DIAGNOSIS — R251 Tremor, unspecified: Secondary | ICD-10-CM

## 2020-06-12 HISTORY — DX: Hyperlipidemia, unspecified: E78.5

## 2020-06-12 LAB — CBC WITH DIFFERENTIAL/PLATELET
Basophils Absolute: 0 10*3/uL (ref 0.0–0.2)
Basos: 1 %
EOS (ABSOLUTE): 0.1 10*3/uL (ref 0.0–0.4)
Eos: 2 %
Hematocrit: 42.2 % (ref 34.0–46.6)
Hemoglobin: 14.6 g/dL (ref 11.1–15.9)
Immature Grans (Abs): 0 10*3/uL (ref 0.0–0.1)
Immature Granulocytes: 0 %
Lymphocytes Absolute: 1 10*3/uL (ref 0.7–3.1)
Lymphs: 23 %
MCH: 34.1 pg — ABNORMAL HIGH (ref 26.6–33.0)
MCHC: 34.6 g/dL (ref 31.5–35.7)
MCV: 99 fL — ABNORMAL HIGH (ref 79–97)
Monocytes Absolute: 0.4 10*3/uL (ref 0.1–0.9)
Monocytes: 11 %
Neutrophils Absolute: 2.7 10*3/uL (ref 1.4–7.0)
Neutrophils: 63 %
Platelets: 231 10*3/uL (ref 150–450)
RBC: 4.28 x10E6/uL (ref 3.77–5.28)
RDW: 11.7 % (ref 11.7–15.4)
WBC: 4.2 10*3/uL (ref 3.4–10.8)

## 2020-06-12 LAB — LIPID PANEL WITH LDL/HDL RATIO
Cholesterol, Total: 214 mg/dL — ABNORMAL HIGH (ref 100–199)
HDL: 70 mg/dL (ref >39)
LDL Chol Calc (NIH): 127 mg/dL — ABNORMAL HIGH (ref 0–99)
LDL/HDL Ratio: 1.8 ratio (ref 0.0–3.2)
Triglycerides: 94 mg/dL (ref 0–149)
VLDL Cholesterol Cal: 17 mg/dL (ref 5–40)

## 2020-06-12 LAB — CMP14+EGFR
ALT: 14 IU/L (ref 0–32)
AST: 13 IU/L (ref 0–40)
Albumin/Globulin Ratio: 3.3 — ABNORMAL HIGH (ref 1.2–2.2)
Albumin: 4.6 g/dL (ref 3.8–4.8)
Alkaline Phosphatase: 106 IU/L (ref 44–121)
BUN/Creatinine Ratio: 9 — ABNORMAL LOW (ref 12–28)
BUN: 7 mg/dL — ABNORMAL LOW (ref 8–27)
Bilirubin Total: 0.2 mg/dL (ref 0.0–1.2)
CO2: 22 mmol/L (ref 20–29)
Calcium: 9.4 mg/dL (ref 8.7–10.3)
Chloride: 106 mmol/L (ref 96–106)
Creatinine, Ser: 0.75 mg/dL (ref 0.57–1.00)
Globulin, Total: 1.4 g/dL — ABNORMAL LOW (ref 1.5–4.5)
Glucose: 95 mg/dL (ref 65–99)
Potassium: 4.3 mmol/L (ref 3.5–5.2)
Sodium: 143 mmol/L (ref 134–144)
Total Protein: 6 g/dL (ref 6.0–8.5)
eGFR: 89 mL/min/{1.73_m2} (ref >59)

## 2020-06-12 LAB — MAGNESIUM: Magnesium: 2 mg/dL (ref 1.6–2.3)

## 2020-06-12 LAB — HIV ANTIBODY (ROUTINE TESTING W REFLEX): HIV Screen 4th Generation wRfx: NONREACTIVE

## 2020-06-12 LAB — VITAMIN B12: Vitamin B-12: 522 pg/mL (ref 232–1245)

## 2020-06-12 LAB — HCV INTERPRETATION

## 2020-06-12 LAB — HCV AB W/RFLX TO VERIFICATION: HCV Ab: <0.1 s/co ratio (ref 0.0–0.9)

## 2020-06-12 LAB — VITAMIN D 25 HYDROXY (VIT D DEFICIENCY, FRACTURES): Vit D, 25-Hydroxy: 26.1 ng/mL — ABNORMAL LOW (ref 30.0–100.0)

## 2020-06-12 MED ORDER — PROPRANOLOL HCL 10 MG PO TABS: 10.0000 mg | ORAL_TABLET | Freq: Three times a day (TID) | ORAL | 1 refills | Status: DC

## 2020-06-12 NOTE — Assessment & Plan Note (Signed)
-  she will take OTC vit D 5000 IU daily

## 2020-06-12 NOTE — Progress Notes (Signed)
Acute Office Visit  Subjective:    Patient ID: [name], female    DOB: 05-13-1955, 64 y.o.   MRN: [id number]  Chief Complaint  Patient presents with  . Tremors    Pt feels jittery on the inside.   . Hyperlipidemia    HPI Patient is in today for tremors a hand numbness/tingling. She wants to discuss her lipid panel results.  Past Medical History:  Diagnosis Date  . Anxiety   . Anxiety state 01/01/2018  . Bipolar 1 disorder ([place])   . Bipolar disorder ([place]) 12/12/2013  . Depression   . Diverticulitis   . Diverticulitis of colon 03/15/2016  . Hyperlipidemia 06/12/2020  . Insomnia 01/01/2018  . Tobacco abuse 06/25/2016  . Tubular adenoma 10/02/2018    Past Surgical History:  Procedure Laterality Date  . COLONOSCOPY    . COLONOSCOPY N/A 08/08/2019   Procedure: COLONOSCOPY;  Surgeon: Rogene Houston, MD;  [facility];  Service: Endoscopy;  Laterality: N/A;  220  . oophrorectomy    . POLYPECTOMY  08/08/2019   Procedure: POLYPECTOMY;  Surgeon: Rogene Houston, MD;  [facility];  Service: Endoscopy;;  . TUBAL LIGATION      Family History  Problem Relation Age of Onset  . Cancer Mother        lichen planus - oral cancer  . Cancer Father        lung  . Cancer Maternal Uncle        uterine  . Diabetes Maternal Uncle   . Stroke Maternal Grandmother   . COPD Paternal Grandfather     Social History   Socioeconomic History  . Marital status: Divorced    Spouse name: Not on file  . Number of children: 2  . Years of education: 64  . Highest education level: Not on file  Occupational History  . Occupation: Psychologist, 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[employer], [place]  Tobacco Use  . Smoking status: Current Every Day Smoker    Packs/day: 0.25    Types: Cigarettes    Last attempt to quit: 12/19/2017    Years since quitting: 2.4  . Smokeless tobacco: Never Used  . Tobacco comment: none in 9 months. after smoking 1 pack a day x 20 yrs.   Vaping Use  . Vaping Use: Never used  Substance and Sexual Activity  . Alcohol use: Not Currently    Comment: week  . Drug use: No  . Sexual activity: Not Currently    Birth control/protection: Post-menopausal  Other Topics Concern  . Not on file  Social History Narrative   Lives alone   She has 2 sons that are close by.      She enjoys going out to dinner and spend time with family.      Diet reports eating all foods.   Caffeine none   Water 6 to 8 cups regularly daily.      Social Determinants of Health   Financial Resource Strain: Not on file  Food Insecurity: Not on file  Transportation Needs: Not on file  Physical Activity: Not on file  Stress: Not on file  Social Connections: Not on file  Intimate Partner Violence: Not on file    Outpatient Medications Prior to Visit  Medication Sig Dispense Refill  . acetaminophen (TYLENOL) 500 MG tablet Take 500 mg by mouth every 6 (six) hours as needed.    . ALPRAZolam (XANAX) 0.5 MG tablet Take 1 tablet (0.5  mg total) by mouth 3 (three) times daily as needed. for anxiety 90 tablet 1  . Ascorbic Acid (VITAMIN C) 1000 MG tablet Take 1,000 mg by mouth daily.    Marland Kitchen atorvastatin (LIPITOR) 20 MG tablet Take 1 tablet (20 mg total) by mouth daily. 90 tablet 3  . buPROPion (WELLBUTRIN XL) 150 MG 24 hr tablet Take 1 tablet (150 mg total) by mouth daily. 90 tablet 1  . busPIRone (BUSPAR) 15 MG tablet TAKE 1 TABLET BY MOUTH TWICE DAILY 180 tablet 0  . calcium elemental as carbonate (TUMS ULTRA 1000) 400 MG chewable tablet Chew 1,000 mg by mouth daily as needed for heartburn.    . Calcium-Magnesium-Zinc 333-133-5 MG TABS Take 1 tablet by mouth daily.    . Cholecalciferol (VITAMIN D3) 50 MCG (2000 UT) TABS Take 2,000 Units by mouth daily.    . Cyanocobalamin (VITAMIN B-12) 5000 MCG TBDP Take 5,000 mg by mouth daily.    Marland Kitchen ibuprofen (ADVIL) 200 MG tablet Take 600 mg by mouth every 6 (six) hours as needed.    . lamoTRIgine (LAMICTAL) 100 MG  tablet Take 2 tablets (200 mg total) by mouth 2 (two) times daily. 360 tablet 1  . Omega-3 1000 MG CAPS Take 1,000 mg by mouth daily.    . pantoprazole (PROTONIX) 40 MG tablet Take 1 tablet (40 mg total) by mouth daily. 90 tablet 1  . traZODone (DESYREL) 100 MG tablet Take 1 tablet (100 mg total) by mouth at bedtime as needed for sleep. 90 tablet 1   No facility-administered medications prior to visit.    Allergies  Allergen Reactions  . Meperidine Hives  . 5-Alpha Reductase Inhibitors     Review of Systems     Objective:    Physical Exam  BP 109/74   Pulse 79   Temp 98.3 F (36.8 C)   Resp 20   Ht 5\' 7"  (1.702 m)   Wt 163 lb (73.9 kg)   SpO2 93%   BMI 25.53 kg/m  Wt Readings from Last 3 Encounters:  06/12/20 163 lb (73.9 kg)  05/14/20 165 lb (74.8 kg)  03/25/20 168 lb (76.2 kg)    Health Maintenance Due  Topic Date Due  . TETANUS/TDAP  Never done  . PAP SMEAR-Modifier  Never done  . MAMMOGRAM  Never done  . COVID-19 Vaccine (2 - Pfizer 3-dose series) 12/08/2019    There are no preventive care reminders to display for this patient.   Lab Results  Component Value Date   TSH 1.340 01/01/2020   Lab Results  Component Value Date   WBC 4.2 06/10/2020   HGB 14.6 06/10/2020   HCT 42.2 06/10/2020   MCV 99 (H) 06/10/2020   PLT 231 06/10/2020   Lab Results  Component Value Date   NA 143 06/10/2020   K 4.3 06/10/2020   CO2 22 06/10/2020   GLUCOSE 95 06/10/2020   BUN 7 (L) 06/10/2020   CREATININE 0.75 06/10/2020   BILITOT 0.2 06/10/2020   ALKPHOS 106 06/10/2020   AST 13 06/10/2020   ALT 14 06/10/2020   PROT 6.0 06/10/2020   ALBUMIN 4.6 06/10/2020   CALCIUM 9.4 06/10/2020   ANIONGAP 10 02/24/2020   Lab Results  Component Value Date   CHOL 214 (H) 06/10/2020   Lab Results  Component Value Date   HDL 70 06/10/2020   Lab Results  Component Value Date   LDLCALC 127 (H) 06/10/2020   Lab Results  Component Value Date  TRIG 94 06/10/2020   No  results found for: [facility] Lab Results  Component Value Date   HGBA1C 5.3 01/01/2020       Assessment & Plan:   Problem List Items Addressed This Visit      Other   Anxiety    -followed by psychiatry -gets anxious when she notices or sees herself with tremor -Rx. Propranolol, may help with anxiety and tremor      Tremor of both hands    -Rx. Propranolol; will start low as her BP is rather low today -she sees Dr. Leta Baptist, and he had mentioned propranolol in his note       Vitamin D deficiency    -she will take OTC vit D 5000 IU daily      Hyperlipidemia    Lab Results  Component Value Date   CHOL 214 (H) 06/10/2020   HDL 70 06/10/2020   LDLCALC 127 (H) 06/10/2020   TRIG 94 06/10/2020   -discussed lifestyle changes vs statins -she would lik to try red yeast rice supplement and exercise      Relevant Medications   propranolol (INDERAL) 10 MG tablet       Meds ordered this encounter  Medications  . propranolol (INDERAL) 10 MG tablet    Sig: Take 1 tablet (10 mg total) by mouth 3 (three) times daily.    Dispense:  90 tablet    Refill:  [place], NP

## 2020-06-12 NOTE — Assessment & Plan Note (Addendum)
-  followed by psychiatry -gets anxious when she notices or sees herself with tremor -Rx. Propranolol, may help with anxiety and tremor

## 2020-06-12 NOTE — Assessment & Plan Note (Signed)
Lab Results  Component Value Date   CHOL 214 (H) 06/10/2020   HDL 70 06/10/2020   LDLCALC 127 (H) 06/10/2020   TRIG 94 06/10/2020   -discussed lifestyle changes vs statins -she would lik to try red yeast rice supplement and exercise

## 2020-06-12 NOTE — Assessment & Plan Note (Signed)
-  Rx. Propranolol; will start low as her BP is rather low today -she sees Dr. Leta Baptist, and he had mentioned propranolol in his note

## 2020-06-13 ENCOUNTER — Ambulatory Visit: Payer: 59 | Admitting: Nurse Practitioner

## 2020-06-13 NOTE — Telephone Encounter (Signed)
Not due till 06/13/2020, so no early RF or extra meds sent.

## 2020-07-02 ENCOUNTER — Other Ambulatory Visit: Payer: Self-pay | Admitting: Physician Assistant

## 2020-07-15 ENCOUNTER — Ambulatory Visit: Payer: 59 | Admitting: Nurse Practitioner

## 2020-07-22 ENCOUNTER — Other Ambulatory Visit: Payer: Self-pay | Admitting: Physician Assistant

## 2020-07-31 ENCOUNTER — Other Ambulatory Visit: Payer: Self-pay | Admitting: Physician Assistant

## 2020-08-05 ENCOUNTER — Other Ambulatory Visit: Payer: Self-pay

## 2020-08-05 ENCOUNTER — Ambulatory Visit (INDEPENDENT_AMBULATORY_CARE_PROVIDER_SITE_OTHER): Payer: 59 | Admitting: Physician Assistant

## 2020-08-05 ENCOUNTER — Encounter: Payer: Self-pay | Admitting: Physician Assistant

## 2020-08-05 ENCOUNTER — Ambulatory Visit: Payer: 59 | Admitting: Nurse Practitioner

## 2020-08-05 DIAGNOSIS — Z87820 Personal history of traumatic brain injury: Secondary | ICD-10-CM

## 2020-08-05 DIAGNOSIS — F3175 Bipolar disorder, in partial remission, most recent episode depressed: Secondary | ICD-10-CM

## 2020-08-05 DIAGNOSIS — G4700 Insomnia, unspecified: Secondary | ICD-10-CM

## 2020-08-05 DIAGNOSIS — F411 Generalized anxiety disorder: Secondary | ICD-10-CM | POA: Diagnosis not present

## 2020-08-05 DIAGNOSIS — R251 Tremor, unspecified: Secondary | ICD-10-CM

## 2020-08-05 DIAGNOSIS — F4310 Post-traumatic stress disorder, unspecified: Secondary | ICD-10-CM

## 2020-08-05 MED ORDER — TRAZODONE HCL 100 MG PO TABS: 100.0000 mg | ORAL_TABLET | Freq: Every evening | ORAL | 1 refills | Status: DC | PRN

## 2020-08-05 MED ORDER — ALPRAZOLAM 0.5 MG PO TABS: 0.5000 mg | ORAL_TABLET | Freq: Three times a day (TID) | ORAL | 5 refills | Status: DC | PRN

## 2020-08-05 MED ORDER — BUPROPION HCL ER (XL) 150 MG PO TB24: 150.0000 mg | ORAL_TABLET | Freq: Every day | ORAL | 1 refills | Status: DC

## 2020-08-05 MED ORDER — BUSPIRONE HCL 15 MG PO TABS: 15.0000 mg | ORAL_TABLET | Freq: Two times a day (BID) | ORAL | 1 refills | Status: DC

## 2020-08-05 NOTE — Progress Notes (Signed)
[facility]  Patient ID: [name],  MRN: [id number]  PCP: Perlie Mayo, NP  Date of Evaluation: 08/05/2020 Time spent:30 minutes  Chief Complaint:  Chief Complaint    Anxiety; Depression; Insomnia; Follow-up      HISTORY/CURRENT STATUS: HPI  For routine med check.  Doing well with meds.  "I am in a really good place."  Able to enjoy things.  Energy and motivation are good.  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  She is not isolating.  Appetite is normal and weight is stable.  Has trouble sleeping if she does not take the trazodone but with that she gets a good night's rest and feels good the next morning.  Denies suicidal or homicidal thoughts.  She still has the bilateral hand tremors sometimes.  Saw neurology back in the fall for it.  It is unclear what was done.  The tremor has improved some since we decreased the Wellbutrin.  When she gets really anxious though it worsens.  Her PCP prescribed propranolol for that and for anxiety it sounds like, but patient told him she does not want it and will not take it until she talks with the neurologist.  She does not have an appointment coming up.  Anxiety is mostly controlled.  She does get more anxious when the tremor starts which leads to worsening tremor ironically.  Takes the Xanax twice a day routinely which is still helpful.  More often than not she does need the third dose.  Patient denies increased energy with decreased need for sleep, no increased talkativeness, no racing thoughts, no impulsivity or risky behaviors, no increased spending, no increased libido, no grandiosity, no increased irritability or anger, and no hallucinations.  Denies dizziness, syncope, seizures, numbness, tingling, tics, unsteady gait, slurred speech, confusion. Denies muscle or joint pain, stiffness, or dystonia.  Individual Medical History/ Review of Systems:  Changes? :Yes  Was Rx Propranolol   Past medications for mental health diagnoses include: Effexor, Ativan, Paxil, Lithobid, Xanax, Lamictal, trazodone, Saphris, Zoloft, Seroquel, BuSpar  Allergies: Meperidine and 5-alpha reductase inhibitors  Current Medications:  Current Outpatient Medications:  .  acetaminophen (TYLENOL) 500 MG tablet, Take 500 mg by mouth every 6 (six) hours as needed., Disp: , Rfl:  .  Ascorbic Acid (VITAMIN C) 1000 MG tablet, Take 1,000 mg by mouth daily., Disp: , Rfl:  .  Cyanocobalamin (VITAMIN B-12) 5000 MCG TBDP, Take 5,000 mg by mouth daily., Disp: , Rfl:  .  ibuprofen (ADVIL) 200 MG tablet, Take 600 mg by mouth every 6 (six) hours as needed., Disp: , Rfl:  .  lamoTRIgine (LAMICTAL) 100 MG tablet, TAKE 2 TABLETS(200 MG) BY MOUTH TWICE DAILY, Disp: 360 tablet, Rfl: 0 .  pantoprazole (PROTONIX) 40 MG tablet, Take 1 tablet (40 mg total) by mouth daily., Disp: 90 tablet, Rfl: 1 .  ALPRAZolam (XANAX) 0.5 MG tablet, Take 1 tablet (0.5 mg total) by mouth 3 (three) times daily as needed. for anxiety, Disp: 90 tablet, Rfl: 5 .  atorvastatin (LIPITOR) 20 MG tablet, Take 1 tablet (20 mg total) by mouth daily. (Patient not taking: Reported on 08/05/2020), Disp: 90 tablet, Rfl: 3 .  buPROPion (WELLBUTRIN XL) 150 MG 24 hr tablet, Take 1 tablet (150 mg total) by mouth daily., Disp: 90 tablet, Rfl: 1 .  busPIRone (BUSPAR) 15 MG tablet, Take 1 tablet (15 mg total) by mouth  2 (two) times daily., Disp: 180 tablet, Rfl: 1 .  calcium elemental as carbonate (TUMS ULTRA 1000) 400 MG chewable tablet, Chew 1,000 mg by mouth daily as needed for heartburn. (Patient not taking: Reported on 08/05/2020), Disp: , Rfl:  .  Calcium-Magnesium-Zinc 333-133-5 MG TABS, Take 1 tablet by mouth daily. (Patient not taking: Reported on 08/05/2020), Disp: , Rfl:  .  Cholecalciferol (VITAMIN D3) 50 MCG (2000 UT) TABS, Take 2,000 Units by mouth daily. (Patient not taking: Reported on 08/05/2020), Disp: , Rfl:  .   Omega-3 1000 MG CAPS, Take 1,000 mg by mouth daily. (Patient not taking: Reported on 08/05/2020), Disp: , Rfl:  .  propranolol (INDERAL) 10 MG tablet, Take 1 tablet (10 mg total) by mouth 3 (three) times daily. (Patient not taking: Reported on 08/05/2020), Disp: 90 tablet, Rfl: 1 .  traZODone (DESYREL) 100 MG tablet, Take 1 tablet (100 mg total) by mouth at bedtime as needed for sleep., Disp: 90 tablet, Rfl: 1 Medication Side Effects: none  Family Medical/ Social History: Changes? No  MENTAL HEALTH EXAM:  There were no vitals taken for this visit.There is no height or weight on file to calculate BMI.  General Appearance: Casual, Neat and Well Groomed  Eye Contact:  Good  Speech:  Clear and Coherent and Normal Rate  Volume:  Normal  Mood:  Euthymic  Affect:  Appropriate  Thought Process:  Goal Directed and Descriptions of Associations: Circumstantial  Orientation:  Full (Time, Place, and Person)  Thought Content: Logical   Suicidal Thoughts:  No  Homicidal Thoughts:  No  Memory:  WNL  Judgement:  Good  Insight:  Good  Psychomotor Activity:  No tremor at this time.  Concentration:  Concentration: Fair  Recall:  Good  Fund of Knowledge: Good  Language: Good  Assets:  Desire for Improvement  ADL's:  Intact  Cognition: WNL  Prognosis:  Good    DIAGNOSES:    ICD-10-CM   1. Generalized anxiety disorder  F41.1   2. Depressed bipolar I disorder in partial remission (HCC)  F31.75   3. PTSD (post-traumatic stress disorder)  F43.10   4. Insomnia, unspecified type  G47.00   5. Tremor of both hands  R25.1   6. History of brain concussion  Z87.820     Receiving Psychotherapy: No    RECOMMENDATIONS:  PDMP was reviewed. I provided 30 minutes of face to face time during this encounter, including time spent before and after the visit in records review, medical decision making, and charting.  I am glad to see her doing so well so no changes in her medications are necessary. I agree with  her PCP that adding propranolol for as needed use is a good idea.  Explained that this not only helps tremor but anxiety as well.  She still refuses to take it until she talks to her neurologist.  Told her that if she could take 1 dose of the propranolol instead of needing the Xanax that would be better. Continue Xanax 0.5 mg, 1 p.o. 3 times daily as needed anxiety. Continue Wellbutrin XL 150 mg, 1 p.o. every morning. Continue BuSpar 15 mg, 1 p.o. twice daily. Continue Lamictal 100 mg, 2 p.o. twice daily. Continue trazodone 100 mg, 1 p.o. nightly as needed sleep. Get back in counseling as needed. Return in 6 months.  Donnal Moat, PA-C

## 2020-08-08 ENCOUNTER — Other Ambulatory Visit: Payer: Self-pay

## 2020-08-08 MED ORDER — PROPRANOLOL HCL 10 MG PO TABS
10.0000 mg | ORAL_TABLET | Freq: Three times a day (TID) | ORAL | 1 refills | Status: DC
Start: 2020-08-08 — End: 2020-08-19

## 2020-08-15 ENCOUNTER — Ambulatory Visit: Payer: 59 | Admitting: Nurse Practitioner

## 2020-08-19 ENCOUNTER — Encounter: Payer: Self-pay | Admitting: Nurse Practitioner

## 2020-08-19 ENCOUNTER — Other Ambulatory Visit: Payer: Self-pay

## 2020-08-19 ENCOUNTER — Ambulatory Visit: Payer: 59 | Admitting: Nurse Practitioner

## 2020-08-19 VITALS — BP 99/64 | HR 74 | Temp 98.1°F | Resp 20 | Ht 67.0 in | Wt 166.0 lb

## 2020-08-19 DIAGNOSIS — R251 Tremor, unspecified: Secondary | ICD-10-CM

## 2020-08-19 DIAGNOSIS — E785 Hyperlipidemia, unspecified: Secondary | ICD-10-CM | POA: Diagnosis not present

## 2020-08-19 DIAGNOSIS — E559 Vitamin D deficiency, unspecified: Secondary | ICD-10-CM

## 2020-08-19 NOTE — Assessment & Plan Note (Addendum)
-  she isn't taking lipitor -she hasn't picked up red yeast rice -she would like to try diet and exercise to help decrease her LDL

## 2020-08-19 NOTE — Assessment & Plan Note (Signed)
BP Readings from Last 3 Encounters:  08/19/20 99/64  06/12/20 109/74  05/14/20 107/68  -she states that she hasn't been taking propranolol -she was concerned that it would drop her BP too low, and her tremors improved some on their own, so she didn't feel like she needed the medicine

## 2020-08-19 NOTE — Patient Instructions (Signed)
Please have fasting labs drawn 2-3 days prior to your appointment so we can discuss the results during your office visit.  

## 2020-08-19 NOTE — Progress Notes (Signed)
Established Patient Office Visit  Subjective:  Patient ID: [name], female    DOB: [birth year]  Age: 65 y.o. MRN: [id number]  CC:  Chief Complaint  Patient presents with   Tremors    Have resolved at the moment.    Hyperlipidemia    Not taking cholesterol med at the moment    HPI [name] presents for med check.  At her last visit, we started her on propranolol for anxiety and tremors. She is followed by psychiatry for anxiety and bipolar 1 disorder.  She is followed by Dr. Marjory Lies for neurology needs.  She was seen recently for seizure hx as well as tremors. The tremors were thought to be from essential tremor (has family hx) and anxiety.  Despite having elevated LDL, she stopped taking atorvastatin.  She stopped smoking in April after she got COVID.  Past Medical History:  Diagnosis Date   Anxiety    Anxiety state 01/01/2018   Bipolar 1 disorder (HCC)    Bipolar disorder (HCC) 12/12/2013   Depression    Diverticulitis    Diverticulitis of colon 03/15/2016   Hyperlipidemia 06/12/2020   Insomnia 01/01/2018   Tobacco abuse 06/25/2016   Tubular adenoma 10/02/2018    Past Surgical History:  Procedure Laterality Date   COLONOSCOPY     COLONOSCOPY N/A 08/08/2019   Procedure: COLONOSCOPY;  Surgeon: Malissa Hippo, MD;  [facility];  Service: Endoscopy;  Laterality: N/A;  220   oophrorectomy     POLYPECTOMY  08/08/2019   Procedure: POLYPECTOMY;  Surgeon: Malissa Hippo, MD;  Location: [facility];  Service: Endoscopy;;   TUBAL LIGATION      Family History  Problem Relation Age of Onset   Cancer Mother        lichen planus - oral cancer   Cancer Father        lung   Cancer Maternal Uncle        uterine   Diabetes Maternal Uncle    Stroke Maternal Grandmother    COPD Paternal Grandfather     Social History   Socioeconomic History   Marital status: Divorced    Spouse name: Not on file   Number of children: 2   Years of  education: 13   Highest education level: Not on file  Occupational History   Occupation: Manufacturing engineer    Comment: [employer], [place]  Tobacco Use   Smoking status: Every Day    Packs/day: 0.25    Pack years: 0.00    Types: E-cigarettes, Cigarettes    Last attempt to quit: 12/19/2017    Years since quitting: 2.6   Smokeless tobacco: Never   Tobacco comments:    none in 9 months. after smoking 1 pack a day x 20 yrs.  Vaping Use   Vaping Use: Never used  Substance and Sexual Activity   Alcohol use: Yes    Alcohol/week: 7.0 standard drinks    Types: 4 Cans of beer, 3 Glasses of wine per week    Comment: week   Drug use: No   Sexual activity: Not Currently    Birth control/protection: Post-menopausal  Other Topics Concern   Not on file  Social History Narrative   Lives alone   She has 2 sons that are close by.      She enjoys going out to dinner and spend time with family.      Diet reports eating all foods.   Caffeine  none   Water 6 to 8 cups regularly daily.      Social Determinants of Health   Financial Resource Strain: Not on file  Food Insecurity: Not on file  Transportation Needs: Not on file  Physical Activity: Not on file  Stress: Not on file  Social Connections: Not on file  Intimate Partner Violence: Not on file    Outpatient Medications Prior to Visit  Medication Sig Dispense Refill   acetaminophen (TYLENOL) 500 MG tablet Take 500 mg by mouth every 6 (six) hours as needed.     ALPRAZolam (XANAX) 0.5 MG tablet Take 1 tablet (0.5 mg total) by mouth 3 (three) times daily as needed. for anxiety 90 tablet 5   Ascorbic Acid (VITAMIN C) 1000 MG tablet Take 1,000 mg by mouth daily.     buPROPion (WELLBUTRIN XL) 150 MG 24 hr tablet Take 1 tablet (150 mg total) by mouth daily. 90 tablet 1   busPIRone (BUSPAR) 15 MG tablet Take 1 tablet (15 mg total) by mouth 2 (two) times daily. 180 tablet 1   calcium elemental as carbonate (TUMS ULTRA 1000) 400 MG  chewable tablet Chew 1,000 mg by mouth daily as needed for heartburn.     Calcium-Magnesium-Zinc 333-133-5 MG TABS Take 1 tablet by mouth daily.     Cholecalciferol (VITAMIN D3) 50 MCG (2000 UT) TABS Take 2,000 Units by mouth daily.     Cyanocobalamin (VITAMIN B-12) 5000 MCG TBDP Take 5,000 mg by mouth daily.     ibuprofen (ADVIL) 200 MG tablet Take 600 mg by mouth every 6 (six) hours as needed.     lamoTRIgine (LAMICTAL) 100 MG tablet TAKE 2 TABLETS(200 MG) BY MOUTH TWICE DAILY 360 tablet 0   Omega-3 1000 MG CAPS Take 1,000 mg by mouth daily.     pantoprazole (PROTONIX) 40 MG tablet Take 1 tablet (40 mg total) by mouth daily. 90 tablet 1   traZODone (DESYREL) 100 MG tablet Take 1 tablet (100 mg total) by mouth at bedtime as needed for sleep. 90 tablet 1   atorvastatin (LIPITOR) 20 MG tablet Take 1 tablet (20 mg total) by mouth daily. (Patient not taking: Reported on 08/19/2020) 90 tablet 3   propranolol (INDERAL) 10 MG tablet Take 1 tablet (10 mg total) by mouth 3 (three) times daily. (Patient not taking: Reported on 08/19/2020) 90 tablet 1   No facility-administered medications prior to visit.    Allergies  Allergen Reactions   Meperidine Hives   5-Alpha Reductase Inhibitors     ROS Review of Systems  Constitutional: Negative.   Respiratory: Negative.    Cardiovascular: Negative.   Neurological:        Tremors have improved without propranolol  Psychiatric/Behavioral: Negative.       Objective:    Physical Exam Constitutional:      Appearance: Normal appearance.  Cardiovascular:     Rate and Rhythm: Normal rate and regular rhythm.     Pulses: Normal pulses.     Heart sounds: Normal heart sounds.  Pulmonary:     Effort: Pulmonary effort is normal.     Breath sounds: Normal breath sounds.  Neurological:     General: No focal deficit present.     Mental Status: She is alert and oriented to person, place, and time.     Comments: Mild tremor present to hands  Psychiatric:         Mood and Affect: Mood normal.        Behavior: Behavior normal.  Thought Content: Thought content normal.        Judgment: Judgment normal.    BP 99/64 (BP Location: Right Arm, Patient Position: Sitting, Cuff Size: Large)   Pulse 74   Temp 98.1 F (36.7 C) (Oral)   Resp 20   Ht $R'5\' 7"'pP$  (1.702 m)   Wt 166 lb (75.3 kg)   SpO2 90%   BMI 26.00 kg/m  Wt Readings from Last 3 Encounters:  08/19/20 166 lb (75.3 kg)  06/12/20 163 lb (73.9 kg)  05/14/20 165 lb (74.8 kg)     Health Maintenance Due  Topic Date Due   Pneumococcal Vaccine 64-30 Years old (1 - PCV) Never done   TETANUS/TDAP  Never done   PAP SMEAR-Modifier  Never done   MAMMOGRAM  Never done   Zoster Vaccines- Shingrix (1 of 2) Never done   COVID-19 Vaccine (2 - Pfizer series) 12/08/2019    There are no preventive care reminders to display for this patient.  Lab Results  Component Value Date   TSH 1.340 01/01/2020   Lab Results  Component Value Date   WBC 4.2 06/10/2020   HGB 14.6 06/10/2020   HCT 42.2 06/10/2020   MCV 99 (H) 06/10/2020   PLT 231 06/10/2020   Lab Results  Component Value Date   NA 143 06/10/2020   K 4.3 06/10/2020   CO2 22 06/10/2020   GLUCOSE 95 06/10/2020   BUN 7 (L) 06/10/2020   CREATININE 0.75 06/10/2020   BILITOT 0.2 06/10/2020   ALKPHOS 106 06/10/2020   AST 13 06/10/2020   ALT 14 06/10/2020   PROT 6.0 06/10/2020   ALBUMIN 4.6 06/10/2020   CALCIUM 9.4 06/10/2020   ANIONGAP 10 02/24/2020   EGFR 89 06/10/2020   Lab Results  Component Value Date   CHOL 214 (H) 06/10/2020   Lab Results  Component Value Date   HDL 70 06/10/2020   Lab Results  Component Value Date   LDLCALC 127 (H) 06/10/2020   Lab Results  Component Value Date   TRIG 94 06/10/2020   No results found for: CHOLHDL Lab Results  Component Value Date   HGBA1C 5.3 01/01/2020      Assessment & Plan:   Problem List Items Addressed This Visit       Other   Tremor of both hands - Primary     BP Readings from Last 3 Encounters:  08/19/20 99/64  06/12/20 109/74  05/14/20 107/68  -she states that she hasn't been taking propranolol -she was concerned that it would drop her BP too low, and her tremors improved some on their own, so she didn't feel like she needed the medicine       Relevant Orders   CBC with Differential/Platelet   CMP14+EGFR   Vitamin D deficiency   Relevant Orders   VITAMIN D 25 Hydroxy (Vit-D Deficiency, Fractures)   Hyperlipidemia    -she isn't taking lipitor -she hasn't picked up red yeast rice -she would like to try diet and exercise to help decrease her LDL       Relevant Orders   Lipid Panel With LDL/HDL Ratio    No orders of the defined types were placed in this encounter.   Follow-up: Return if symptoms worsen or fail to improve.    Noreene Larsson, NP

## 2020-10-06 ENCOUNTER — Other Ambulatory Visit: Payer: Self-pay | Admitting: Physician Assistant

## 2020-11-11 ENCOUNTER — Ambulatory Visit: Payer: 59 | Admitting: Nurse Practitioner

## 2020-11-11 ENCOUNTER — Ambulatory Visit: Payer: 59 | Admitting: Family Medicine

## 2020-11-14 ENCOUNTER — Ambulatory Visit (INDEPENDENT_AMBULATORY_CARE_PROVIDER_SITE_OTHER): Payer: 59 | Admitting: Internal Medicine

## 2020-11-14 ENCOUNTER — Encounter: Payer: Self-pay | Admitting: Internal Medicine

## 2020-11-14 ENCOUNTER — Other Ambulatory Visit: Payer: Self-pay

## 2020-11-14 DIAGNOSIS — K5792 Diverticulitis of intestine, part unspecified, without perforation or abscess without bleeding: Secondary | ICD-10-CM | POA: Diagnosis not present

## 2020-11-14 MED ORDER — AMOXICILLIN-POT CLAVULANATE 875-125 MG PO TABS: 1.0000 | ORAL_TABLET | Freq: Two times a day (BID) | ORAL | 0 refills | Status: DC

## 2020-11-14 MED ORDER — ONDANSETRON HCL 4 MG PO TABS: 4.0000 mg | ORAL_TABLET | Freq: Three times a day (TID) | ORAL | 0 refills | Status: DC | PRN

## 2020-11-14 NOTE — Progress Notes (Signed)
Virtual Visit via Telephone Note   This visit type was conducted due to national recommendations for restrictions regarding the COVID-19 Pandemic (e.g. social distancing) in an effort to limit this patient's exposure and mitigate transmission in our community.  Due to her co-morbid illnesses, this patient is at least at moderate risk for complications without adequate follow up.  This format is felt to be most appropriate for this patient at this time.  The patient did not have access to video technology/had technical difficulties with video requiring transitioning to audio format only (telephone).  All issues noted in this document were discussed and addressed.  No physical exam could be performed with this format.  Evaluation Performed:  Follow-up visit  Date:  11/14/2020   ID:  Shelia, [name] 17-Aug-1955, MRN [id number]  Patient Location: Home Provider Location: Office/Clinic  Participants: Patient Location of Patient: Home Location of Provider: Telehealth Consent was obtain for visit to be over via telehealth. I verified that I am speaking with the correct person using two identifiers.  PCP:  Noreene Larsson, NP   Chief Complaint:  LLQ abdominal pain  History of Present Illness:    [name] is a 66 y.o. female who has a televisit for c/o LLQ pain for last 2 days, which is sharp, nonradiating and worse after eating. She has mild nausea, but denies any vomiting. Denies any fever, chills. She has h/o diverticulosis. She tried to contact GI office, but was not able to see them today.  The patient does not have symptoms concerning for COVID-19 infection (fever, chills, cough, or new shortness of breath).   Past Medical, Surgical, Social History, Allergies, and Medications have been Reviewed.  Past Medical History:  Diagnosis Date   Anxiety    Anxiety state 01/01/2018   Bipolar 1 disorder ([facility])    Bipolar disorder ([facility]) 12/12/2013   Depression    Diverticulitis     Diverticulitis of colon 03/15/2016   Hyperlipidemia 06/12/2020   Insomnia 01/01/2018   Tobacco abuse 06/25/2016   Tubular adenoma 10/02/2018   Past Surgical History:  Procedure Laterality Date   COLONOSCOPY     COLONOSCOPY N/A 08/08/2019   Procedure: COLONOSCOPY;  Surgeon: Rogene Houston, MD;  [facility];  Service: Endoscopy;  Laterality: N/A;  220   oophrorectomy     POLYPECTOMY  08/08/2019   Procedure: POLYPECTOMY;  Surgeon: Rogene Houston, MD;  [facility];  Service: Endoscopy;;   TUBAL LIGATION       Current Meds  Medication Sig   acetaminophen (TYLENOL) 500 MG tablet Take 500 mg by mouth every 6 (six) hours as needed.   ALPRAZolam (XANAX) 0.5 MG tablet Take 1 tablet (0.5 mg total) by mouth 3 (three) times daily as needed. for anxiety   Ascorbic Acid (VITAMIN C) 1000 MG tablet Take 1,000 mg by mouth daily.   buPROPion (WELLBUTRIN XL) 150 MG 24 hr tablet Take 1 tablet (150 mg total) by mouth daily.   busPIRone (BUSPAR) 15 MG tablet Take 1 tablet (15 mg total) by mouth 2 (two) times daily.   calcium elemental as carbonate (TUMS ULTRA 1000) 400 MG chewable tablet Chew 1,000 mg by mouth daily as needed for heartburn.   Calcium-Magnesium-Zinc 333-133-5 MG TABS Take 1 tablet by mouth daily.   Cholecalciferol (VITAMIN D3) 50 MCG (2000 UT) TABS Take 2,000 Units by mouth daily.   Cyanocobalamin (VITAMIN B-12) 5000 MCG TBDP Take 5,000 mg by mouth daily.  ibuprofen (ADVIL) 200 MG tablet Take 600 mg by mouth every 6 (six) hours as needed.   lamoTRIgine (LAMICTAL) 100 MG tablet TAKE 2 TABLETS(200 MG) BY MOUTH TWICE DAILY   Omega-3 1000 MG CAPS Take 1,000 mg by mouth daily.   pantoprazole (PROTONIX) 40 MG tablet Take 1 tablet (40 mg total) by mouth daily.   traZODone (DESYREL) 100 MG tablet Take 1 tablet (100 mg total) by mouth at bedtime as needed for sleep.     Allergies:   Meperidine and 5-alpha reductase inhibitors   ROS:   Please see the history of present  illness.     All other systems reviewed and are negative.   Labs/Other Tests and Data Reviewed:    Recent Labs: 01/01/2020: TSH 1.340 06/10/2020: ALT 14; BUN 7; Creatinine, Ser 0.75; Hemoglobin 14.6; Magnesium 2.0; Platelets 231; Potassium 4.3; Sodium 143   Recent Lipid Panel Lab Results  Component Value Date/Time   CHOL 214 (H) 06/10/2020 09:38 AM   TRIG 94 06/10/2020 09:38 AM   HDL 70 06/10/2020 09:38 AM   LDLCALC 127 (H) 06/10/2020 09:38 AM    Wt Readings from Last 3 Encounters:  08/19/20 166 lb (75.3 kg)  06/12/20 163 lb (73.9 kg)  05/14/20 165 lb (74.8 kg)     ASSESSMENT & PLAN:    Acute diverticulitis Started Augmentin Zofran PRN for nausea Maintain adequate hydration GI soft diet, advance as tolerated Has had episodes of diverticulitis in the past Last colonoscopy in 07/2019 reviewed Advised to follow up with GI May need General surgery referral if recurrent diverticulitis  Time:   Today, I have spent 16 minutes reviewing the chart, including problem list, medications, and with the patient with telehealth technology discussing the above problems.   Medication Adjustments/Labs and Tests Ordered: Current medicines are reviewed at length with the patient today.  Concerns regarding medicines are outlined above.   Tests Ordered: No orders of the defined types were placed in this encounter.   Medication Changes: No orders of the defined types were placed in this encounter.    Note: This dictation was prepared with Dragon dictation along with smaller phrase technology. Similar sounding words can be transcribed inadequately or may not be corrected upon review. Any transcriptional errors that result from this process are unintentional.      Disposition:  Follow up  Signed, Lindell Spar, MD  11/14/2020 12:15 PM     [place]

## 2020-12-30 ENCOUNTER — Other Ambulatory Visit: Payer: Self-pay | Admitting: Physician Assistant

## 2021-01-07 ENCOUNTER — Other Ambulatory Visit: Payer: Self-pay

## 2021-01-07 ENCOUNTER — Ambulatory Visit
Admission: EM | Admit: 2021-01-07 | Discharge: 2021-01-07 | Disposition: A | Discharge status: Home / Self Care | Payer: 59

## 2021-01-07 DIAGNOSIS — Z8719 Personal history of other diseases of the digestive system: Secondary | ICD-10-CM

## 2021-01-07 DIAGNOSIS — R0981 Nasal congestion: Secondary | ICD-10-CM

## 2021-01-07 DIAGNOSIS — H9203 Otalgia, bilateral: Secondary | ICD-10-CM

## 2021-01-07 DIAGNOSIS — J069 Acute upper respiratory infection, unspecified: Secondary | ICD-10-CM | POA: Diagnosis not present

## 2021-01-07 DIAGNOSIS — Z87891 Personal history of nicotine dependence: Secondary | ICD-10-CM

## 2021-01-07 MED ORDER — PROMETHAZINE-DM 6.25-15 MG/5ML PO SYRP: 5.0000 mL | ORAL_SOLUTION | Freq: Every evening | ORAL | 0 refills | Status: DC | PRN

## 2021-01-07 MED ORDER — PSEUDOEPHEDRINE HCL 30 MG PO TABS: 30.0000 mg | ORAL_TABLET | Freq: Three times a day (TID) | ORAL | 0 refills | Status: DC | PRN

## 2021-01-07 MED ORDER — CETIRIZINE HCL 10 MG PO TABS: 10.0000 mg | ORAL_TABLET | Freq: Every day | ORAL | 0 refills | Status: DC

## 2021-01-07 NOTE — Discharge Instructions (Addendum)
We will notify you of your test results as they arrive and may take between 48-72 hours.  I encourage you to sign up for MyChart if you have not already done so as this can be the easiest way for [place] to communicate results to you online or through a phone app.  Generally, we only contact you if it is a positive test result.  In the meantime, if you develop worsening symptoms including fever, chest pain, shortness of breath despite our current treatment plan then please report to the emergency room as this may be a sign of worsening status from possible viral infection.  Otherwise, we will manage this as a viral syndrome. For sore throat or cough try using a honey-based tea. Use 3 teaspoons of honey with juice squeezed from half lemon. Place shaved pieces of ginger into 1/2-1 cup of water and warm over stove top. Then mix the ingredients and repeat every 4 hours as needed. Please take Tylenol 500mg -650mg  every 6 hours for aches and pains, fevers. Hydrate very well with at least 2 liters of water. Eat light meals such as soups to replenish electrolytes and soft fruits, veggies. Start an antihistamine like Zyrtec for postnasal drainage, sinus congestion.  You can take this together with pseudoephedrine (Sudafed) at a dose of 30-60 mg 2-3 times a day as needed for the same kind of congestion.

## 2021-01-07 NOTE — ED Provider Notes (Signed)
[place]   MRN: [id number] DOB: 09/22/1955  Subjective:   [name] is a 65 y.o. female presenting for 4-day history of acute onset cough, postnasal drainage, started having sinus headaches and sinus pressure, right ear pain.  Has been using Mucinex and an over-the-counter allergy medication.  Took Augmentin for diverticulitis this past summer.  No chest pain, shortness of breath or wheezing.  Patient has had about 10-pack-year history of smoking, quit about 4 months ago.  No current facility-administered medications for this encounter.  Current Outpatient Medications:    acetaminophen (TYLENOL) 500 MG tablet, Take 500 mg by mouth every 6 (six) hours as needed., Disp: , Rfl:    ALPRAZolam (XANAX) 0.5 MG tablet, Take 1 tablet (0.5 mg total) by mouth 3 (three) times daily as needed. for anxiety, Disp: 90 tablet, Rfl: 5   amoxicillin-clavulanate (AUGMENTIN) 875-125 MG tablet, Take 1 tablet by mouth 2 (two) times daily., Disp: 14 tablet, Rfl: 0   Ascorbic Acid (VITAMIN C) 1000 MG tablet, Take 1,000 mg by mouth daily., Disp: , Rfl:    benzonatate (TESSALON) 100 MG capsule, benzonatate 100 mg capsule, Disp: , Rfl:    buPROPion (WELLBUTRIN XL) 150 MG 24 hr tablet, Take 1 tablet (150 mg total) by mouth daily., Disp: 90 tablet, Rfl: 1   busPIRone (BUSPAR) 15 MG tablet, Take 1 tablet (15 mg total) by mouth 2 (two) times daily., Disp: 180 tablet, Rfl: 1   calcium elemental as carbonate (TUMS ULTRA 1000) 400 MG chewable tablet, Chew 1,000 mg by mouth daily as needed for heartburn., Disp: , Rfl:    Calcium-Magnesium-Zinc 333-133-5 MG TABS, Take 1 tablet by mouth daily., Disp: , Rfl:    Cholecalciferol (VITAMIN D3) 50 MCG (2000 UT) TABS, Take 2,000 Units by mouth daily., Disp: , Rfl:    Cyanocobalamin (VITAMIN B-12) 5000 MCG TBDP, Take 5,000 mg by mouth daily., Disp: , Rfl:    ibuprofen (ADVIL) 200 MG tablet, Take 600 mg by mouth every 6 (six) hours as needed., Disp: , Rfl:     lamoTRIgine (LAMICTAL) 100 MG tablet, TAKE 2 TABLETS(200 MG) BY MOUTH TWICE DAILY, Disp: 120 tablet, Rfl: 0   Omega-3 1000 MG CAPS, Take 1,000 mg by mouth daily., Disp: , Rfl:    ondansetron (ZOFRAN) 4 MG tablet, Take 1 tablet (4 mg total) by mouth every 8 (eight) hours as needed for nausea or vomiting., Disp: 20 tablet, Rfl: 0   pantoprazole (PROTONIX) 40 MG tablet, Take 1 tablet (40 mg total) by mouth daily., Disp: 90 tablet, Rfl: 1   traZODone (DESYREL) 100 MG tablet, Take 1 tablet (100 mg total) by mouth at bedtime as needed for sleep., Disp: 90 tablet, Rfl: 1   Allergies  Allergen Reactions   Meperidine Hives   5-Alpha Reductase Inhibitors     Past Medical History:  Diagnosis Date   Anxiety    Anxiety state 01/01/2018   Bipolar 1 disorder ([place])    Bipolar disorder ([place]) 12/12/2013   Depression    Diverticulitis    Diverticulitis of colon 03/15/2016   Hyperlipidemia 06/12/2020   Insomnia 01/01/2018   Tobacco abuse 06/25/2016   Tubular adenoma 10/02/2018     Past Surgical History:  Procedure Laterality Date   COLONOSCOPY     COLONOSCOPY N/A 08/08/2019   Procedure: COLONOSCOPY;  Surgeon: Rogene Houston, MD;  [facility];  Service: Endoscopy;  Laterality: N/A;  220   oophrorectomy     POLYPECTOMY  08/08/2019  Procedure: POLYPECTOMY;  Surgeon: Rogene Houston, MD;  Location: [facility];  Service: Endoscopy;;   TUBAL LIGATION      Family History  Problem Relation Age of Onset   Cancer Mother        lichen planus - oral cancer   Cancer Father        lung   Cancer Maternal Uncle        uterine   Diabetes Maternal Uncle    Stroke Maternal Grandmother    COPD Paternal Grandfather     Social History   Tobacco Use   Smoking status: Former    Packs/day: 0.25    Types: E-cigarettes, Cigarettes    Quit date: 12/19/2017    Years since quitting: 3.0   Smokeless tobacco: Never   Tobacco comments:    none in 9 months. after smoking 1 pack a day x 20 yrs.   Vaping Use   Vaping Use: Never used  Substance Use Topics   Alcohol use: Yes    Alcohol/week: 7.0 standard drinks    Types: 4 Cans of beer, 3 Glasses of wine per week    Comment: week   Drug use: No    ROS   Objective:   Vitals: BP 114/75 (BP Location: Right Arm)   Pulse 84   Temp 97.9 F (36.6 C) (Oral)   Resp 18   SpO2 96%   Physical Exam Constitutional:      General: She is not in acute distress.    Appearance: Normal appearance. She is well-developed. She is not ill-appearing, toxic-appearing or diaphoretic.  HENT:     Head: Normocephalic and atraumatic.     Right Ear: Tympanic membrane, ear canal and external ear normal. No drainage or tenderness. No middle ear effusion. There is no impacted cerumen. Tympanic membrane is not erythematous.     Left Ear: Tympanic membrane, ear canal and external ear normal. No drainage or tenderness.  No middle ear effusion. There is no impacted cerumen. Tympanic membrane is not erythematous.     Nose: Congestion and rhinorrhea present.     Mouth/Throat:     Mouth: Mucous membranes are moist. No oral lesions.     Pharynx: No pharyngeal swelling, oropharyngeal exudate, posterior oropharyngeal erythema or uvula swelling.     Tonsils: No tonsillar exudate or tonsillar abscesses.  Eyes:     General: No scleral icterus.       Right eye: No discharge.        Left eye: No discharge.     Extraocular Movements: Extraocular movements intact.     Right eye: Normal extraocular motion.     Left eye: Normal extraocular motion.     Conjunctiva/sclera: Conjunctivae normal.     Pupils: Pupils are equal, round, and reactive to light.  Cardiovascular:     Rate and Rhythm: Normal rate and regular rhythm.     Pulses: Normal pulses.     Heart sounds: Normal heart sounds. No murmur heard.   No friction rub. No gallop.  Pulmonary:     Effort: Pulmonary effort is normal. No respiratory distress.     Breath sounds: Normal breath sounds. No stridor. No  wheezing, rhonchi or rales.  Musculoskeletal:     Cervical back: Normal range of motion and neck supple.  Lymphadenopathy:     Cervical: No cervical adenopathy.  Skin:    General: Skin is warm and dry.     Findings: No rash.  Neurological:  General: No focal deficit present.     Mental Status: She is alert and oriented to person, place, and time.  Psychiatric:        Mood and Affect: Mood normal.        Behavior: Behavior normal.        Thought Content: Thought content normal.    Assessment and Plan :   PDMP not reviewed this encounter.  1. Viral URI with cough   2. Acute ear pain, bilateral   3. Sinus congestion   4. History of diverticulitis   5. History of smoking    COVID and flu test pending.  We will otherwise manage for viral upper respiratory infection.  Physical exam findings reassuring and vital signs stable for discharge. Advised supportive care, offered symptomatic relief. Discussed antibiotic stewardship. Patient was agreeable to holding off on antibiotics especially with her history of diverticulitis. For the same, she prefers to hold off on prednisone. Deferred imaging given clear cardiopulmonary exam, hemodynamically stable vital signs. Counseled patient on potential for adverse effects with medications prescribed/recommended today, ER and return-to-clinic precautions discussed, patient verbalized understanding.      Jaynee Eagles, PA-C 01/07/21 1113

## 2021-01-07 NOTE — ED Triage Notes (Signed)
4 day h/o cough, 2 day h/o HA and onset yesterday of right ear pain and sinus pressure. Has been taking Mucinex and other OTC meds with some relief.

## 2021-01-08 LAB — COVID-19, FLU A+B NAA
Influenza A, NAA: NOT DETECTED
Influenza B, NAA: NOT DETECTED
SARS-CoV-2, NAA: NOT DETECTED

## 2021-01-09 ENCOUNTER — Encounter: Payer: Self-pay | Admitting: Internal Medicine

## 2021-01-09 ENCOUNTER — Other Ambulatory Visit: Payer: Self-pay | Admitting: Internal Medicine

## 2021-01-09 ENCOUNTER — Other Ambulatory Visit: Payer: Self-pay

## 2021-01-09 ENCOUNTER — Ambulatory Visit (INDEPENDENT_AMBULATORY_CARE_PROVIDER_SITE_OTHER): Payer: 59 | Admitting: Internal Medicine

## 2021-01-09 DIAGNOSIS — R062 Wheezing: Secondary | ICD-10-CM | POA: Diagnosis not present

## 2021-01-09 DIAGNOSIS — J0100 Acute maxillary sinusitis, unspecified: Secondary | ICD-10-CM | POA: Diagnosis not present

## 2021-01-09 MED ORDER — AMOXICILLIN-POT CLAVULANATE 875-125 MG PO TABS: 1.0000 | ORAL_TABLET | Freq: Two times a day (BID) | ORAL | 0 refills | Status: DC

## 2021-01-09 MED ORDER — ALBUTEROL SULFATE HFA 108 (90 BASE) MCG/ACT IN AERS: 2.0000 | INHALATION_SPRAY | Freq: Four times a day (QID) | RESPIRATORY_TRACT | 0 refills | Status: DC | PRN

## 2021-01-09 NOTE — Progress Notes (Signed)
Virtual Visit via Telephone Note   This visit type was conducted due to national recommendations for restrictions regarding the COVID-19 Pandemic (e.g. social distancing) in an effort to limit this patient's exposure and mitigate transmission in our community.  Due to her co-morbid illnesses, this patient is at least at moderate risk for complications without adequate follow up.  This format is felt to be most appropriate for this patient at this time.  The patient did not have access to video technology/had technical difficulties with video requiring transitioning to audio format only (telephone).  All issues noted in this document were discussed and addressed.  No physical exam could be performed with this format.  Evaluation Performed:  Follow-up visit  Date:  01/09/2021   ID:  [name], Pack [birth year], MRN [id number]  Patient Location: Home Provider Location: Office/Clinic  Participants: Patient Location of Patient: Home Location of Provider: Telehealth Consent was obtain for visit to be over via telehealth. I verified that I am speaking with the correct person using two identifiers.  PCP:  Noreene Larsson, NP   Chief Complaint: Cough, nasal congestion and headache  History of Present Illness:    [name] is a 65 y.o. female who has a telehealth visit for complaint of cough, nasal congestion, headache and wheezing for last 1 week.  She went to urgent care and negative COVID test.  She has tried taking Mucinex, Zyrtec and Tylenol without any relief.  She has wheezing only at nighttime, but denies any dyspnea currently.  Denies any fever, chills, nausea or vomiting.  Denies any recent sick contacts.  The patient does not have symptoms concerning for COVID-19 infection (fever, chills, cough, or new shortness of breath).   Past Medical, Surgical, Social History, Allergies, and Medications have been Reviewed.  Past Medical History:  Diagnosis Date   Anxiety     Anxiety state 01/01/2018   Bipolar 1 disorder ([place])    Bipolar disorder ([place]) 12/12/2013   Depression    Diverticulitis    Diverticulitis of colon 03/15/2016   Hyperlipidemia 06/12/2020   Insomnia 01/01/2018   Tobacco abuse 06/25/2016   Tubular adenoma 10/02/2018   Past Surgical History:  Procedure Laterality Date   COLONOSCOPY     COLONOSCOPY N/A 08/08/2019   Procedure: COLONOSCOPY;  Surgeon: Rogene Houston, MD;  [facility];  Service: Endoscopy;  Laterality: N/A;  220   oophrorectomy     POLYPECTOMY  08/08/2019   Procedure: POLYPECTOMY;  Surgeon: Rogene Houston, MD;  [facility];  Service: Endoscopy;;   TUBAL LIGATION       Current Meds  Medication Sig   acetaminophen (TYLENOL) 500 MG tablet Take 500 mg by mouth every 6 (six) hours as needed.   ALPRAZolam (XANAX) 0.5 MG tablet Take 1 tablet (0.5 mg total) by mouth 3 (three) times daily as needed. for anxiety   Ascorbic Acid (VITAMIN C) 1000 MG tablet Take 1,000 mg by mouth daily.   benzonatate (TESSALON) 100 MG capsule benzonatate 100 mg capsule   buPROPion (WELLBUTRIN XL) 150 MG 24 hr tablet Take 1 tablet (150 mg total) by mouth daily.   busPIRone (BUSPAR) 15 MG tablet Take 1 tablet (15 mg total) by mouth 2 (two) times daily.   calcium elemental as carbonate (TUMS ULTRA 1000) 400 MG chewable tablet Chew 1,000 mg by mouth daily as needed for heartburn.   Calcium-Magnesium-Zinc 333-133-5 MG TABS Take 1 tablet by mouth daily.  cetirizine (ZYRTEC ALLERGY) 10 MG tablet Take 1 tablet (10 mg total) by mouth daily.   Cholecalciferol (VITAMIN D3) 50 MCG (2000 UT) TABS Take 2,000 Units by mouth daily.   Cyanocobalamin (VITAMIN B-12) 5000 MCG TBDP Take 5,000 mg by mouth daily.   ibuprofen (ADVIL) 200 MG tablet Take 600 mg by mouth every 6 (six) hours as needed.   lamoTRIgine (LAMICTAL) 100 MG tablet TAKE 2 TABLETS(200 MG) BY MOUTH TWICE DAILY   Omega-3 1000 MG CAPS Take 1,000 mg by mouth daily.   ondansetron (ZOFRAN) 4  MG tablet Take 1 tablet (4 mg total) by mouth every 8 (eight) hours as needed for nausea or vomiting.   pantoprazole (PROTONIX) 40 MG tablet Take 1 tablet (40 mg total) by mouth daily.   promethazine-dextromethorphan (PROMETHAZINE-DM) 6.25-15 MG/5ML syrup Take 5 mLs by mouth at bedtime as needed for cough.   pseudoephedrine (SUDAFED) 30 MG tablet Take 1 tablet (30 mg total) by mouth every 8 (eight) hours as needed for congestion.   traZODone (DESYREL) 100 MG tablet Take 1 tablet (100 mg total) by mouth at bedtime as needed for sleep.     Allergies:   Meperidine and 5-alpha reductase inhibitors   ROS:   Please see the history of present illness.     All other systems reviewed and are negative.   Labs/Other Tests and Data Reviewed:    Recent Labs: 06/10/2020: ALT 14; BUN 7; Creatinine, Ser 0.75; Hemoglobin 14.6; Magnesium 2.0; Platelets 231; Potassium 4.3; Sodium 143   Recent Lipid Panel Lab Results  Component Value Date/Time   CHOL 214 (H) 06/10/2020 09:38 AM   TRIG 94 06/10/2020 09:38 AM   HDL 70 06/10/2020 09:38 AM   LDLCALC 127 (H) 06/10/2020 09:38 AM    Wt Readings from Last 3 Encounters:  08/19/20 166 lb (75.3 kg)  06/12/20 163 lb (73.9 kg)  05/14/20 165 lb (74.8 kg)     ASSESSMENT & PLAN:    Acute sinusitis Nasal congestion and sinus pressure related headache, persistent despite symptomatic treatment Started Augmentin Nasal saline spray as needed Continue Zyrtec for allergies Continue symptomatic treatment Albuterol as needed for wheezing  Time:   Today, I have spent 9 minutes reviewing the chart, including problem list, medications, and with the patient with telehealth technology discussing the above problems.   Medication Adjustments/Labs and Tests Ordered: Current medicines are reviewed at length with the patient today.  Concerns regarding medicines are outlined above.   Tests Ordered: No orders of the defined types were placed in this  encounter.   Medication Changes: No orders of the defined types were placed in this encounter.    Note: This dictation was prepared with Dragon dictation along with smaller phrase technology. Similar sounding words can be transcribed inadequately or may not be corrected upon review. Any transcriptional errors that result from this process are unintentional.      Disposition:  Follow up  Signed, Lindell Spar, MD  01/09/2021 11:40 AM     [facility]

## 2021-01-13 ENCOUNTER — Telehealth: Payer: 59 | Admitting: Family Medicine

## 2021-01-19 ENCOUNTER — Ambulatory Visit: Payer: 59 | Admitting: Nurse Practitioner

## 2021-01-22 ENCOUNTER — Ambulatory Visit: Payer: 59 | Admitting: Nurse Practitioner

## 2021-02-02 ENCOUNTER — Ambulatory Visit: Payer: 59 | Admitting: Nurse Practitioner

## 2021-02-03 ENCOUNTER — Ambulatory Visit: Payer: 59 | Admitting: Physician Assistant

## 2021-02-06 ENCOUNTER — Other Ambulatory Visit: Payer: Self-pay | Admitting: Internal Medicine

## 2021-02-06 DIAGNOSIS — R062 Wheezing: Secondary | ICD-10-CM

## 2021-02-09 ENCOUNTER — Other Ambulatory Visit: Payer: Self-pay | Admitting: Physician Assistant

## 2021-02-12 ENCOUNTER — Other Ambulatory Visit: Payer: Self-pay | Admitting: Physician Assistant

## 2021-02-12 ENCOUNTER — Other Ambulatory Visit: Payer: Self-pay

## 2021-02-12 ENCOUNTER — Ambulatory Visit (INDEPENDENT_AMBULATORY_CARE_PROVIDER_SITE_OTHER): Payer: 59 | Admitting: Physician Assistant

## 2021-02-12 ENCOUNTER — Encounter: Payer: Self-pay | Admitting: Physician Assistant

## 2021-02-12 DIAGNOSIS — F411 Generalized anxiety disorder: Secondary | ICD-10-CM | POA: Diagnosis not present

## 2021-02-12 DIAGNOSIS — F3175 Bipolar disorder, in partial remission, most recent episode depressed: Secondary | ICD-10-CM

## 2021-02-12 DIAGNOSIS — G4700 Insomnia, unspecified: Secondary | ICD-10-CM | POA: Diagnosis not present

## 2021-02-12 DIAGNOSIS — F4310 Post-traumatic stress disorder, unspecified: Secondary | ICD-10-CM | POA: Diagnosis not present

## 2021-02-12 MED ORDER — LAMOTRIGINE 200 MG PO TABS: 200.0000 mg | ORAL_TABLET | Freq: Two times a day (BID) | ORAL | 5 refills | Status: DC

## 2021-02-12 MED ORDER — BUSPIRONE HCL 15 MG PO TABS: 15.0000 mg | ORAL_TABLET | Freq: Two times a day (BID) | ORAL | 3 refills | Status: DC

## 2021-02-12 MED ORDER — TRAZODONE HCL 100 MG PO TABS: ORAL_TABLET | ORAL | 3 refills | Status: DC

## 2021-02-12 MED ORDER — TRAZODONE HCL 100 MG PO TABS: 100.0000 mg | ORAL_TABLET | Freq: Every evening | ORAL | 3 refills | Status: DC | PRN

## 2021-02-12 MED ORDER — ALPRAZOLAM 0.5 MG PO TABS: 0.5000 mg | ORAL_TABLET | Freq: Three times a day (TID) | ORAL | 5 refills | Status: DC | PRN

## 2021-02-12 NOTE — Progress Notes (Signed)
[facility] Check  Patient ID: [name],  MRN: [id number]  PCP: Noreene Larsson, NP  Date of Evaluation: 02/12/2021 Time spent:20 minutes  Chief Complaint:  Chief Complaint   Anxiety; Depression; Insomnia; Follow-up      HISTORY/CURRENT STATUS: HPI  For routine med check.  [name] states she is doing very well.  Feels like her medications are working.  She is able to enjoy things, energy and motivation are good, she is not isolating or crying easily.  She sleeps well, not too much or too little.  No suicidal or homicidal thoughts.  She does still have a lot of anxiety. She is not having panic attacks but more of a generalized sense of unease, like something bad is going to happen at any time.  When she starts feeling back more severely, she takes the Xanax and it helps.  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  Patient denies increased energy with decreased need for sleep, no increased talkativeness, no racing thoughts, no impulsivity or risky behaviors, no increased spending, no increased libido, no grandiosity, no increased irritability or anger, and no hallucinations.  Denies dizziness, syncope, seizures, numbness, tremor, tingling, tics, unsteady gait, slurred speech, confusion. Denies muscle or joint pain, stiffness, or dystonia.  Individual Medical History/ Review of Systems: Changes? :No     Past medications for mental health diagnoses include: Effexor, Ativan, Paxil, Lithobid, Xanax, Lamictal, trazodone, Saphris, Zoloft, Seroquel, BuSpar Wellbutrin caused tremor  Allergies: Meperidine and 5-alpha reductase inhibitors  Current Medications:  Current Outpatient Medications:    acetaminophen (TYLENOL) 500 MG tablet, Take 500 mg by mouth every 6 (six) hours as needed., Disp: , Rfl:    albuterol (VENTOLIN HFA) 108 (90 Base) MCG/ACT inhaler, INHALE 2 PUFFS INTO THE LUNGS EVERY 6 HOURS AS NEEDED FOR  WHEEZING OR SHORTNESS OF BREATH, Disp: 6.7 g, Rfl: 0   Ascorbic Acid (VITAMIN C) 1000 MG tablet, Take 1,000 mg by mouth daily., Disp: , Rfl:    calcium elemental as carbonate (TUMS ULTRA 1000) 400 MG chewable tablet, Chew 1,000 mg by mouth daily as needed for heartburn., Disp: , Rfl:    Calcium-Magnesium-Zinc 333-133-5 MG TABS, Take 1 tablet by mouth daily., Disp: , Rfl:    cetirizine (ZYRTEC ALLERGY) 10 MG tablet, Take 1 tablet (10 mg total) by mouth daily., Disp: 30 tablet, Rfl: 0   Cholecalciferol (VITAMIN D3) 50 MCG (2000 UT) TABS, Take 2,000 Units by mouth daily., Disp: , Rfl:    ibuprofen (ADVIL) 200 MG tablet, Take 600 mg by mouth every 6 (six) hours as needed., Disp: , Rfl:    lamoTRIgine (LAMICTAL) 100 MG tablet, TAKE 2 TABLETS(200 MG) BY MOUTH TWICE DAILY, Disp: 120 tablet, Rfl: 0   lamoTRIgine (LAMICTAL) 200 MG tablet, Take 1 tablet (200 mg total) by mouth 2 (two) times daily., Disp: 60 tablet, Rfl: 5   Omega-3 1000 MG CAPS, Take 1,000 mg by mouth daily., Disp: , Rfl:    ondansetron (ZOFRAN) 4 MG tablet, Take 1 tablet (4 mg total) by mouth every 8 (eight) hours as needed for nausea or vomiting., Disp: 20 tablet, Rfl: 0   pantoprazole (PROTONIX) 40 MG tablet, Take 1 tablet (40 mg total) by mouth daily., Disp: 90 tablet, Rfl: 1   ALPRAZolam (XANAX) 0.5 MG tablet, Take 1 tablet (0.5 mg total) by mouth 3 (three) times daily as needed. for anxiety, Disp: 90 tablet, Rfl: 5   busPIRone (BUSPAR) 15  MG tablet, Take 1 tablet (15 mg total) by mouth 2 (two) times daily., Disp: 180 tablet, Rfl: 3   Cyanocobalamin (VITAMIN B-12) 5000 MCG TBDP, Take 5,000 mg by mouth daily. (Patient not taking: Reported on 02/12/2021), Disp: , Rfl:    promethazine-dextromethorphan (PROMETHAZINE-DM) 6.25-15 MG/5ML syrup, Take 5 mLs by mouth at bedtime as needed for cough. (Patient not taking: Reported on 02/12/2021), Disp: 100 mL, Rfl: 0   pseudoephedrine (SUDAFED) 30 MG tablet, Take 1 tablet (30 mg total) by mouth every 8  (eight) hours as needed for congestion. (Patient not taking: Reported on 02/12/2021), Disp: 30 tablet, Rfl: 0   traZODone (DESYREL) 100 MG tablet, Take 1-2 tablets (100-200 mg total) by mouth at bedtime as needed for sleep., Disp: 180 tablet, Rfl: 3 Medication Side Effects: none  Family Medical/ Social History: Changes? No  MENTAL HEALTH EXAM:  There were no vitals taken for this visit.There is no height or weight on file to calculate BMI.  General Appearance: Casual, Neat and Well Groomed  Eye Contact:  Good  Speech:  Clear and Coherent and Normal Rate  Volume:  Normal  Mood:  Euthymic  Affect:  Appropriate  Thought Process:  Goal Directed and Descriptions of Associations: Circumstantial  Orientation:  Full (Time, Place, and Person)  Thought Content: Logical   Suicidal Thoughts:  No  Homicidal Thoughts:  No  Memory:  WNL  Judgement:  Good  Insight:  Good  Psychomotor Activity:  Normal  Concentration:  Concentration: Fair  Recall:  Good  Fund of Knowledge: Good  Language: Good  Assets:  Desire for Improvement  ADL's:  Intact  Cognition: WNL  Prognosis:  Good    DIAGNOSES:    ICD-10-CM   1. Generalized anxiety disorder  F41.1     2. Depressed bipolar I disorder in partial remission (HCC)  F31.75     3. PTSD (post-traumatic stress disorder)  F43.10     4. Insomnia, unspecified type  G47.00        Receiving Psychotherapy: No    RECOMMENDATIONS:  PDMP was reviewed.  Last Xanax filled 02/09/2021. I provided 20 minutes of face to face time during this encounter, including time spent before and after the visit in records review, medical decision making, counseling pertinent to today's visit, and charting.  I am glad to see her doing so well so no changes in her medications are necessary. Continue Xanax 0.5 mg, 1 p.o. 3 times daily as needed anxiety. Continue BuSpar 15 mg, 1 p.o. twice daily. Continue Lamictal 100 mg, 2 p.o. twice daily. Return in 6 months.  Donnal Moat, PA-C

## 2021-02-22 ENCOUNTER — Encounter: Payer: Self-pay | Admitting: Physician Assistant

## 2021-04-01 ENCOUNTER — Encounter: Payer: Self-pay | Admitting: Internal Medicine

## 2021-04-01 ENCOUNTER — Ambulatory Visit (INDEPENDENT_AMBULATORY_CARE_PROVIDER_SITE_OTHER): Payer: 59 | Admitting: Internal Medicine

## 2021-04-01 ENCOUNTER — Other Ambulatory Visit: Payer: Self-pay

## 2021-04-01 DIAGNOSIS — K5792 Diverticulitis of intestine, part unspecified, without perforation or abscess without bleeding: Secondary | ICD-10-CM

## 2021-04-01 DIAGNOSIS — K219 Gastro-esophageal reflux disease without esophagitis: Secondary | ICD-10-CM | POA: Diagnosis not present

## 2021-04-01 DIAGNOSIS — Z2821 Immunization not carried out because of patient refusal: Secondary | ICD-10-CM

## 2021-04-01 MED ORDER — PANTOPRAZOLE SODIUM 40 MG PO TBEC: 40.0000 mg | DELAYED_RELEASE_TABLET | Freq: Every day | ORAL | 1 refills | Status: DC

## 2021-04-01 NOTE — Progress Notes (Signed)
Virtual Visit via Telephone Note   This visit type was conducted due to national recommendations for restrictions regarding the COVID-19 Pandemic (e.g. social distancing) in an effort to limit this patient's exposure and mitigate transmission in our community.  Due to her co-morbid illnesses, this patient is at least at moderate risk for complications without adequate follow up.  This format is felt to be most appropriate for this patient at this time.  The patient did not have access to video technology/had technical difficulties with video requiring transitioning to audio format only (telephone).  All issues noted in this document were discussed and addressed.  No physical exam could be performed with this format.  Evaluation Performed:  Follow-up visit  Date:  04/01/2021   ID:  Phenix, Grein Aug 04, 1955, MRN [id number]  Patient Location: Home Provider Location: Office/Clinic  Participants: Patient Location of Patient: Home Location of Provider: Telehealth Consent was obtain for visit to be over via telehealth. I verified that I am speaking with the correct person using two identifiers.  PCP:  Noreene Larsson, NP   Chief Complaint: Epigastric pain  History of Present Illness:    Linde A Puder is a 66 y.o. female who has a televisit for complaint of epigastric pain for last 2 days.  Of note, she had left lower quadrant pain about 5 days ago, and went to urgent care and was given Augmentin for diverticulitis.  Her LLQ pain has improved and she also had a BM.  She states that she used a suppository for severe constipation.  She denies any nausea, vomiting, diarrhea, melena or hematochezia currently.  Denies any fever or chills.  The patient does not have symptoms concerning for COVID-19 infection (fever, chills, cough, or new shortness of breath).   Past Medical, Surgical, Social History, Allergies, and Medications have been Reviewed.  Past Medical History:  Diagnosis  Date   Anxiety    Anxiety state 01/01/2018   Bipolar 1 disorder ([place])    Bipolar disorder ([place]) 12/12/2013   Depression    Diverticulitis    Diverticulitis of colon 03/15/2016   Hyperlipidemia 06/12/2020   Insomnia 01/01/2018   Tobacco abuse 06/25/2016   Tubular adenoma 10/02/2018   Past Surgical History:  Procedure Laterality Date   COLONOSCOPY     COLONOSCOPY N/A 08/08/2019   Procedure: COLONOSCOPY;  Surgeon: Rogene Houston, MD;  [facility];  Service: Endoscopy;  Laterality: N/A;  220   oophrorectomy     POLYPECTOMY  08/08/2019   Procedure: POLYPECTOMY;  Surgeon: Rogene Houston, MD;  [facility];  Service: Endoscopy;;   TUBAL LIGATION       Current Meds  Medication Sig   acetaminophen (TYLENOL) 500 MG tablet Take 500 mg by mouth every 6 (six) hours as needed.   albuterol (VENTOLIN HFA) 108 (90 Base) MCG/ACT inhaler INHALE 2 PUFFS INTO THE LUNGS EVERY 6 HOURS AS NEEDED FOR WHEEZING OR SHORTNESS OF BREATH   ALPRAZolam (XANAX) 0.5 MG tablet Take 1 tablet (0.5 mg total) by mouth 3 (three) times daily as needed. for anxiety   Ascorbic Acid (VITAMIN C) 1000 MG tablet Take 1,000 mg by mouth daily.   busPIRone (BUSPAR) 15 MG tablet Take 1 tablet (15 mg total) by mouth 2 (two) times daily.   calcium elemental as carbonate (TUMS ULTRA 1000) 400 MG chewable tablet Chew 1,000 mg by mouth daily as needed for heartburn.   Calcium-Magnesium-Zinc 333-133-5 MG TABS Take 1 tablet  by mouth daily.   cetirizine (ZYRTEC ALLERGY) 10 MG tablet Take 1 tablet (10 mg total) by mouth daily.   Cholecalciferol (VITAMIN D3) 50 MCG (2000 UT) TABS Take 2,000 Units by mouth daily.   Cyanocobalamin (VITAMIN B-12) 5000 MCG TBDP Take 5,000 mg by mouth daily.   ibuprofen (ADVIL) 200 MG tablet Take 600 mg by mouth every 6 (six) hours as needed.   lamoTRIgine (LAMICTAL) 100 MG tablet TAKE 2 TABLETS(200 MG) BY MOUTH TWICE DAILY   lamoTRIgine (LAMICTAL) 200 MG tablet Take 1 tablet (200 mg total) by  mouth 2 (two) times daily.   Omega-3 1000 MG CAPS Take 1,000 mg by mouth daily.   ondansetron (ZOFRAN) 4 MG tablet Take 1 tablet (4 mg total) by mouth every 8 (eight) hours as needed for nausea or vomiting.   pantoprazole (PROTONIX) 40 MG tablet Take 1 tablet (40 mg total) by mouth daily.   promethazine-dextromethorphan (PROMETHAZINE-DM) 6.25-15 MG/5ML syrup Take 5 mLs by mouth at bedtime as needed for cough.   pseudoephedrine (SUDAFED) 30 MG tablet Take 1 tablet (30 mg total) by mouth every 8 (eight) hours as needed for congestion.   traZODone (DESYREL) 100 MG tablet Take 1-2 tablets (100-200 mg total) by mouth at bedtime as needed for sleep.     Allergies:   Meperidine and 5-alpha reductase inhibitors   ROS:   Please see the history of present illness.     All other systems reviewed and are negative.   Labs/Other Tests and Data Reviewed:    Recent Labs: 06/10/2020: ALT 14; BUN 7; Creatinine, Ser 0.75; Hemoglobin 14.6; Magnesium 2.0; Platelets 231; Potassium 4.3; Sodium 143   Recent Lipid Panel Lab Results  Component Value Date/Time   CHOL 214 (H) 06/10/2020 09:38 AM   TRIG 94 06/10/2020 09:38 AM   HDL 70 06/10/2020 09:38 AM   LDLCALC 127 (H) 06/10/2020 09:38 AM    Wt Readings from Last 3 Encounters:  08/19/20 166 lb (75.3 kg)  06/12/20 163 lb (73.9 kg)  05/14/20 165 lb (74.8 kg)     ASSESSMENT & PLAN:    Acute diverticulitis LLQ pain better with Augmentin now Advised to advance diet as tolerated Maintain adequate hydration Follow up with GI  Gastroesophageal reflux disease without esophagitis Epigastric pain could be due to gastritis/GERD Does not have pantoprazole currently, refilled for now Avoid hot and spicy food   Time:   Today, I have spent 13 minutes reviewing the chart, including problem list, medications, and with the patient with telehealth technology discussing the above problems.   Medication Adjustments/Labs and Tests Ordered: Current medicines are  reviewed at length with the patient today.  Concerns regarding medicines are outlined above.   Tests Ordered: No orders of the defined types were placed in this encounter.   Medication Changes: No orders of the defined types were placed in this encounter.    Note: This dictation was prepared with Dragon dictation along with smaller phrase technology. Similar sounding words can be transcribed inadequately or may not be corrected upon review. Any transcriptional errors that result from this process are unintentional.      Disposition:  Follow up  Signed, Lindell Spar, MD  04/01/2021 4:27 PM     [place]

## 2021-04-01 NOTE — Assessment & Plan Note (Signed)
Epigastric pain could be due to gastritis/GERD Does not have pantoprazole currently, refilled for now Avoid hot and spicy food

## 2021-04-01 NOTE — Patient Instructions (Signed)
Please start taking Pantoprazole for upper abdominal pain/gastritis.

## 2021-04-10 ENCOUNTER — Other Ambulatory Visit: Payer: Self-pay

## 2021-04-10 ENCOUNTER — Emergency Department (HOSPITAL_COMMUNITY): Payer: 59

## 2021-04-10 ENCOUNTER — Emergency Department (HOSPITAL_COMMUNITY)
Admission: EM | Admit: 2021-04-10 | Discharge: 2021-04-10 | Disposition: A | Discharge status: Home / Self Care | Payer: 59 | Attending: Emergency Medicine | Admitting: Emergency Medicine

## 2021-04-10 DIAGNOSIS — R1032 Left lower quadrant pain: Secondary | ICD-10-CM | POA: Insufficient documentation

## 2021-04-10 LAB — COMPREHENSIVE METABOLIC PANEL
ALT: 14 U/L (ref 0–44)
AST: 16 U/L (ref 15–41)
Albumin: 4.2 g/dL (ref 3.5–5.0)
Alkaline Phosphatase: 94 U/L (ref 38–126)
Anion gap: 9 (ref 5–15)
BUN: 8 mg/dL (ref 8–23)
CO2: 27 mmol/L (ref 22–32)
Calcium: 9.6 mg/dL (ref 8.9–10.3)
Chloride: 107 mmol/L (ref 98–111)
Creatinine, Ser: 0.69 mg/dL (ref 0.44–1.00)
GFR, Estimated: >60 mL/min (ref >60)
Glucose, Bld: 109 mg/dL — ABNORMAL HIGH (ref 70–99)
Potassium: 4 mmol/L (ref 3.5–5.1)
Sodium: 143 mmol/L (ref 135–145)
Total Bilirubin: 0.4 mg/dL (ref 0.3–1.2)
Total Protein: 7 g/dL (ref 6.5–8.1)

## 2021-04-10 LAB — CBC WITH DIFFERENTIAL/PLATELET
Abs Immature Granulocytes: 0.02 10*3/uL (ref 0.00–0.07)
Basophils Absolute: 0 10*3/uL (ref 0.0–0.1)
Basophils Relative: 1 %
Eosinophils Absolute: 0.2 10*3/uL (ref 0.0–0.5)
Eosinophils Relative: 4 %
HCT: 45.3 % (ref 36.0–46.0)
Hemoglobin: 15.1 g/dL — ABNORMAL HIGH (ref 12.0–15.0)
Immature Granulocytes: 0 %
Lymphocytes Relative: 36 %
Lymphs Abs: 1.9 10*3/uL (ref 0.7–4.0)
MCH: 35.3 pg — ABNORMAL HIGH (ref 26.0–34.0)
MCHC: 33.3 g/dL (ref 30.0–36.0)
MCV: 105.8 fL — ABNORMAL HIGH (ref 80.0–100.0)
Monocytes Absolute: 0.6 10*3/uL (ref 0.1–1.0)
Monocytes Relative: 10 %
Neutro Abs: 2.6 10*3/uL (ref 1.7–7.7)
Neutrophils Relative %: 49 %
Platelets: 252 10*3/uL (ref 150–400)
RBC: 4.28 MIL/uL (ref 3.87–5.11)
RDW: 12.4 % (ref 11.5–15.5)
WBC: 5.4 10*3/uL (ref 4.0–10.5)
nRBC: 0 % (ref 0.0–0.2)

## 2021-04-10 LAB — URINALYSIS, ROUTINE W REFLEX MICROSCOPIC
Bilirubin Urine: NEGATIVE
Glucose, UA: NEGATIVE mg/dL
Ketones, ur: NEGATIVE mg/dL
Nitrite: NEGATIVE
Specific Gravity, Urine: 1.02 (ref 1.005–1.030)
pH: 6 (ref 5.0–8.0)

## 2021-04-10 LAB — URINALYSIS, MICROSCOPIC (REFLEX)

## 2021-04-10 LAB — LIPASE, BLOOD: Lipase: 34 U/L (ref 11–51)

## 2021-04-10 MED ORDER — MORPHINE SULFATE (PF) 4 MG/ML IV SOLN
4.0000 mg | Freq: Once | INTRAVENOUS | Status: AC
  Administered 2021-04-10: 4 mg via INTRAVENOUS
  Filled 2021-04-10: qty 1

## 2021-04-10 MED ORDER — IOHEXOL 300 MG/ML  SOLN
100.0000 mL | Freq: Once | INTRAMUSCULAR | Status: AC | PRN
  Administered 2021-04-10: 100 mL via INTRAVENOUS

## 2021-04-10 MED ORDER — ONDANSETRON HCL 4 MG/2ML IJ SOLN
4.0000 mg | Freq: Once | INTRAMUSCULAR | Status: AC
  Administered 2021-04-10: 4 mg via INTRAVENOUS
  Filled 2021-04-10: qty 2

## 2021-04-10 MED ORDER — SODIUM CHLORIDE 0.9 % IV BOLUS
500.0000 mL | Freq: Once | INTRAVENOUS | Status: AC
  Administered 2021-04-10: 500 mL via INTRAVENOUS

## 2021-04-10 MED ORDER — OXYCODONE-ACETAMINOPHEN 5-325 MG PO TABS
1.0000 | ORAL_TABLET | Freq: Once | ORAL | Status: AC
  Administered 2021-04-10: 1 via ORAL
  Filled 2021-04-10: qty 1

## 2021-04-10 MED ORDER — DICYCLOMINE HCL 20 MG PO TABS: 20.0000 mg | ORAL_TABLET | Freq: Three times a day (TID) | ORAL | 0 refills | Status: DC | PRN

## 2021-04-10 NOTE — ED Triage Notes (Signed)
Hx diverticulosis, abdomen pain, pt states medication (given 2 weeks ago) is not working, nausea. Stomach feels like it is about to "burst"

## 2021-04-10 NOTE — ED Provider Notes (Signed)
Emergency Department Provider Note   I have reviewed the triage vital signs and the nursing notes.   HISTORY  Chief Complaint Abdominal Pain   HPI [name] is a 66 y.o. female past medical history reviewed below including prior history of diverticulitis presents to the emergency department left lower quadrant abdominal pain not improving on Augmentin.  She was started empirically on Augmentin for presumed diverticulitis during an urgent care visit on 1/25.  She has since completed the course of antibiotics and states the pain never really fully resolved.  She saw her PCP in follow-up but today felt like her pain was worsening and is experiencing some abdominal distention.  She notes some radiation of pain to her left flank.  Pain is intermittent without clear provoking factor.  No chest discomfort.  No fevers or chills.  No bloody diarrhea.  No nausea or vomiting.   Past Medical History:  Diagnosis Date   Anxiety    Anxiety state 01/01/2018   Bipolar 1 disorder ([place])    Bipolar disorder ([place]) 12/12/2013   Depression    Diverticulitis    Diverticulitis of colon 03/15/2016   Hyperlipidemia 06/12/2020   Insomnia 01/01/2018   Tobacco abuse 06/25/2016   Tubular adenoma 10/02/2018    Review of Systems  Constitutional: No fever/chills Eyes: No visual changes. ENT: No sore throat. Cardiovascular: Denies chest pain. Respiratory: Denies shortness of breath. Gastrointestinal: Positive LLQ abdominal pain.  No nausea, no vomiting.  No diarrhea.  No constipation. Genitourinary: Negative for dysuria. Musculoskeletal: Negative for back pain. Skin: Negative for rash. Neurological: Negative for headaches, focal weakness or numbness.  ____________________________________________   PHYSICAL EXAM:  VITAL SIGNS: ED Triage Vitals  Enc Vitals Group     BP 04/10/21 1749 134/90     Pulse Rate 04/10/21 1749 88     Resp 04/10/21 1749 15     Temp 04/10/21 1749 98.5 F (36.9 C)      Temp Source 04/10/21 1749 Oral     SpO2 04/10/21 1749 96 %     Weight 04/10/21 1750 167 lb (75.8 kg)     Height 04/10/21 1750 5\' 7"  (1.702 m)   Constitutional: Alert and oriented. Well appearing and in no acute distress. Eyes: Conjunctivae are normal.  Head: Atraumatic. Nose: No congestion/rhinnorhea. Mouth/Throat: Mucous membranes are moist.  Neck: No stridor.  Cardiovascular: Normal rate, regular rhythm. Good peripheral circulation. Grossly normal heart sounds.   Respiratory: Normal respiratory effort.  No retractions. Lungs CTAB. Gastrointestinal: Soft with focal LLQ tenderness. No rebound or guarding. No distention.  Musculoskeletal: No lower extremity tenderness nor edema. No gross deformities of extremities. Neurologic:  Normal speech and language.  Skin:  Skin is warm, dry and intact. No rash noted.   ____________________________________________   LABS (all labs ordered are listed, but only abnormal results are displayed)  Labs Reviewed  COMPREHENSIVE METABOLIC PANEL - Abnormal; Notable for the following components:      Result Value   Glucose, Bld 109 (*)    All other components within normal limits  CBC WITH DIFFERENTIAL/PLATELET - Abnormal; Notable for the following components:   Hemoglobin 15.1 (*)    MCV 105.8 (*)    MCH 35.3 (*)    All other components within normal limits  URINALYSIS, ROUTINE W REFLEX MICROSCOPIC - Abnormal; Notable for the following components:   Hgb urine dipstick MODERATE (*)    Protein, ur TRACE (*)    Leukocytes,Ua SMALL (*)    All other components  within normal limits  URINALYSIS, MICROSCOPIC (REFLEX) - Abnormal; Notable for the following components:   Bacteria, UA RARE (*)    All other components within normal limits  URINE CULTURE  LIPASE, BLOOD   ____________________________________________  RADIOLOGY  CT abdomen/pelvis  ____________________________________________   PROCEDURES  Procedure(s) performed:    Procedures  None ____________________________________________   INITIAL IMPRESSION / ASSESSMENT AND PLAN / ED COURSE  Pertinent labs & imaging results that were available during my care of the patient were reviewed by me and considered in my medical decision making (see chart for details).   This patient is Presenting for Evaluation of abdominal pain, which does require a range of treatment options, and is a complaint that involves a high risk of morbidity and mortality.  The Differential Diagnoses includes but is not exclusive to acute appendicitis, renal colic, testicular torsion, urinary tract infection, prostatitis,  diverticulitis, small bowel obstruction, colitis, abdominal aortic aneurysm, gastroenteritis, constipation etc.   Critical Interventions- IVF and IV pain medication   Medications  sodium chloride 0.9 % bolus 500 mL (0 mLs Intravenous Stopped 04/10/21 2035)  morphine (PF) 4 MG/ML injection 4 mg (4 mg Intravenous Given 04/10/21 1855)  ondansetron (ZOFRAN) injection 4 mg (4 mg Intravenous Given 04/10/21 1856)  iohexol (OMNIPAQUE) 300 MG/ML solution 100 mL (100 mLs Intravenous Contrast Given 04/10/21 2018)  oxyCODONE-acetaminophen (PERCOCET/ROXICET) 5-325 MG per tablet 1 tablet (1 tablet Oral Given 04/10/21 2102)    Reassessment after intervention:  pain improved.    I did obtain Additional Historical Information from friend at bedside.  I decided to review pertinent External Data, and in summary last Ed visit was Nov 2022 for unrelated event.   Clinical Laboratory Tests Ordered, included CMP without AKI or electrolyte abnormality. LFTs and bilirubin are negative. CBC WNL.   Radiologic Tests Ordered, included CT abdomen/pelvis. I independently interpreted the images and agree with radiology interpretation.   Cardiac Monitor Tracing which shows NSR  Social Determinants of Health Risk patient is a former smoker.   Medical Decision Making: Summary:  Patient treated  empirically for diverticulitis recently. No complications seen on CT. No diverticulitis. Normal adnexa. Patient with upcoming GI follow up in the coming week. Plan for pain mgmt at home and PCP/GI follow up.   Reevaluation with update and discussion with patient. Discussed results and follow up plan. Patient pleased at discharge.   Disposition: discharge  ____________________________________________  FINAL CLINICAL IMPRESSION(S) / ED DIAGNOSES  Final diagnoses:  Left lower quadrant abdominal pain     NEW OUTPATIENT MEDICATIONS STARTED DURING THIS VISIT:  Discharge Medication List as of 04/10/2021 10:22 PM     START taking these medications   Details  dicyclomine (BENTYL) 20 MG tablet Take 1 tablet (20 mg total) by mouth 3 (three) times daily as needed for spasms., Starting Fri 04/10/2021, Normal        Note:  This document was prepared using Dragon voice recognition software and may include unintentional dictation errors.  Nanda Quinton, MD, [facility]    [name], [facility], MD 04/12/21 [phone]

## 2021-04-10 NOTE — ED Notes (Signed)
Patient transported to CT 

## 2021-04-10 NOTE — Discharge Instructions (Signed)

## 2021-04-10 NOTE — ED Notes (Signed)
Pt returned from radiology and ambulated to restroom to provide urine specimen.

## 2021-04-12 LAB — URINE CULTURE: Culture: NO GROWTH

## 2021-04-14 ENCOUNTER — Other Ambulatory Visit: Payer: Self-pay

## 2021-04-14 ENCOUNTER — Encounter (INDEPENDENT_AMBULATORY_CARE_PROVIDER_SITE_OTHER): Payer: Self-pay | Admitting: Gastroenterology

## 2021-04-14 ENCOUNTER — Ambulatory Visit (INDEPENDENT_AMBULATORY_CARE_PROVIDER_SITE_OTHER): Payer: 59 | Admitting: Gastroenterology

## 2021-04-14 VITALS — Ht 67.0 in | Wt 169.0 lb

## 2021-04-14 DIAGNOSIS — R1032 Left lower quadrant pain: Secondary | ICD-10-CM | POA: Insufficient documentation

## 2021-04-14 DIAGNOSIS — R6881 Early satiety: Secondary | ICD-10-CM | POA: Diagnosis not present

## 2021-04-14 DIAGNOSIS — R194 Change in bowel habit: Secondary | ICD-10-CM

## 2021-04-14 DIAGNOSIS — R140 Abdominal distension (gaseous): Secondary | ICD-10-CM

## 2021-04-14 NOTE — Progress Notes (Signed)
Primary Care Physician:  Noreene Larsson, NP  Primary GI: [facility]  Patient Location: Home   Provider Location: [place] [facility] office   Reason for Visit: ED follow up of LLQ pain, bloating and early satiety   Persons present on the virtual encounter, with roles: [name] L. Alver Sorrow, MSN, APRN, AGNP-C, [employer], patient   Total time (minutes) spent on medical discussion: 20 minutes  Virtual Visit via telephone (pt unable to access mychart) visit is conducted virtually and was requested by patient.   I connected with [name] on 04/14/21 at  3:30 PM EST by telephone and verified that I am speaking with the correct person using two identifiers.   I discussed the limitations, risks, security and privacy concerns of performing an evaluation and management service by telephone and the availability of in person appointments. I also discussed with the patient that there may be a patient responsible charge related to this service. The patient expressed understanding and agreed to proceed.  Chief Complaint  Patient presents with   Abdominal Pain    Patient here today to follow up on recent Ed visit on 04/10/2021 due to LLQ pain and bloating. She state she is afraid to eat.    History of Present Illness: [name] is a 66 year old female with past medical history of anxiety, bipolar 1 disorder, depression, diverticulitis, hyperlipemia.   Had ED visit on 04/10/21 for LLQ pain. CT A/P with contrast at that time with only findings of diverticulosis without diverticulitis, no evidence of constipation or obstruction. Patient had previous UC visit for presumed diverticulitis and was started on augmentin but had no improvement in symptoms.   Today, patient states that she went to UC 3 weeks ago from this coming Sunday with LLQ pain, bloating and early satiety, she was started on augmentin for suspected diverticulitis. She states that pain continued despite medication.  She went to  the ER on Friday night for continued pain. States that pain has improved some since then but she continues to have twinges of pain in her LLQ  as well as the early satiety and bloating. She is passing gas but this does not improve her symptoms. She was given bentyl 20mg  TID PRN but she has not taken these as she wanted to wait until she had this appt today. She does report that she has been doing protein shakes and smaller meals and feels very full and bloated no matter what she eats. She denies worsening pain post prandially. Occasionally pain feels like menstrual cramps. Initially, before she went to UC, she had severe constipation and had been 2 days without a BM. She used some suppositories and was able to have a small BM but did not feel it was sufficient. She reports that she had some diarrhea Saturday after being in the ER on Friday night. She has had a BM daily since that but also felt like she was having to strain some, she used senokot and finally had a large BM today. She denies rectal bleeding or melena. She endorses that abdominal bloating and LLQ pain did not improve after BM. Reports PCP started her on protonix 40mg  once daily at the end of January without any improvement in her symptoms. She does not have issues with acid regurgitation or heartburn. She has had no weight loss.   Last colonoscopy: 6/2/21The examined portion of the ileum was normal. - Diverticulosis in the sigmoid colon, in the descending colon, at the splenic flexure  and in the transverse colon. - Two 6 to 8 mm polyps at the splenic flexure, removed with a cold snare. Resected and retrieved. (Both tubular adenomas)  Recommendations to repeat colonoscopy in 5 years  Past Medical History:  Diagnosis Date   Anxiety    Anxiety state 01/01/2018   Bipolar 1 disorder ([name])    Bipolar disorder ([place]) 12/12/2013   Depression    Diverticulitis    Diverticulitis of colon 03/15/2016   Hyperlipidemia 06/12/2020   Insomnia 01/01/2018    Tobacco abuse 06/25/2016   Tubular adenoma 10/02/2018     Past Surgical History:  Procedure Laterality Date   COLONOSCOPY     COLONOSCOPY N/A 08/08/2019   Procedure: COLONOSCOPY;  Surgeon: Rogene Houston, MD;  [facility];  Service: Endoscopy;  Laterality: N/A;  220   oophrorectomy     POLYPECTOMY  08/08/2019   Procedure: POLYPECTOMY;  Surgeon: Rogene Houston, MD;  [facility];  Service: Endoscopy;;   TUBAL LIGATION       Current Meds  Medication Sig   acetaminophen (TYLENOL) 500 MG tablet Take 1,000 mg by mouth every 6 (six) hours as needed.   albuterol (VENTOLIN HFA) 108 (90 Base) MCG/ACT inhaler INHALE 2 PUFFS INTO THE LUNGS EVERY 6 HOURS AS NEEDED FOR WHEEZING OR SHORTNESS OF BREATH   ALPRAZolam (XANAX) 0.5 MG tablet Take 1 tablet (0.5 mg total) by mouth 3 (three) times daily as needed. for anxiety   busPIRone (BUSPAR) 15 MG tablet Take 1 tablet (15 mg total) by mouth 2 (two) times daily.   cetirizine (ZYRTEC ALLERGY) 10 MG tablet Take 1 tablet (10 mg total) by mouth daily.   Cholecalciferol (VITAMIN D3) 50 MCG (2000 UT) TABS Take 2,000 Units by mouth daily.   lamoTRIgine (LAMICTAL) 100 MG tablet TAKE 2 TABLETS(200 MG) BY MOUTH TWICE DAILY   ondansetron (ZOFRAN) 4 MG tablet Take 1 tablet (4 mg total) by mouth every 8 (eight) hours as needed for nausea or vomiting.   OVER THE COUNTER MEDICATION elderberry   pantoprazole (PROTONIX) 40 MG tablet Take 1 tablet (40 mg total) by mouth daily.   traZODone (DESYREL) 100 MG tablet Take 1-2 tablets (100-200 mg total) by mouth at bedtime as needed for sleep.     Family History  Problem Relation Age of Onset   Cancer Mother        lichen planus - oral cancer   Cancer Father        lung   Cancer Maternal Uncle        uterine   Diabetes Maternal Uncle    Stroke Maternal Grandmother    COPD Paternal Grandfather     Social History   Socioeconomic History   Marital status: Divorced    Spouse name: Not on  file   Number of children: 2   Years of education: 13   Highest education level: Not on file  Occupational History   Occupation: Psychologist, 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[employer], [place]  Tobacco Use   Smoking status: Every Day    Packs/day: 0.25    Types: E-cigarettes, Cigarettes    Last attempt to quit: 12/19/2017    Years since quitting: 3.3   Smokeless tobacco: Never   Tobacco comments:    none in 9 months. after smoking 1 pack a day x 20 yrs.  Vaping Use   Vaping Use: Never used  Substance and Sexual Activity   Alcohol use: Yes    Alcohol/week:  7.0 standard drinks    Types: 4 Cans of beer, 3 Glasses of wine per week    Comment: week   Drug use: No   Sexual activity: Not Currently    Birth control/protection: Post-menopausal  Other Topics Concern   Not on file  Social History Narrative   Lives alone   She has 2 sons that are close by.      She enjoys going out to dinner and spend time with family.      Diet reports eating all foods.   Caffeine none   Water 6 to 8 cups regularly daily.      Social Determinants of Health   Financial Resource Strain: Not on file  Food Insecurity: Not on file  Transportation Needs: Not on file  Physical Activity: Not on file  Stress: Not on file  Social Connections: Not on file   Review of Systems: Gen: Denies fever, chills, anorexia. Denies fatigue, weakness, weight loss.  CV: Denies chest pain, palpitations, syncope, peripheral edema, and claudication. Resp: Denies dyspnea at rest, cough, wheezing, coughing up blood, and pleurisy. GI: see HPI Derm: Denies rash, itching, dry skin Psych: Denies depression, anxiety, memory loss, confusion. No homicidal or suicidal ideation.  Heme: Denies bruising, bleeding, and enlarged lymph nodes.  Observations/Objective: No distress. Unable to perform physical exam due to telephone encounter. No video available.   Assessment and Plan: Taylor Spilde is a 66 year old female who  presents virtually today with 3-4 week course of LLQ pain, upper abdominal bloating, early satiety and changes in bowel habits. Recent CT A/P w contrast on 2/3 without acute findings. S/p treatment of presumed diverticulitis with augmentin without any improvement in symptoms. Recently started on PPI that also did not improve symptoms. It is likely patient is experiencing symptoms of underlying IBS, however, given her bloating, early satiety, ongoing LLQ pain and changes in bowel habits, further endoscopic evaluation should be completed. We will proceed with EGD and colonoscopy. Indications, risks and benefits of procedure discussed in detail with patient. Patient verbalized understanding and is in agreement to proceed with EGD and colonoscopy at this time.   I also advised patient that she may benefit from starting bentyl 20mg  TID PRN that was given to her in the ER, as this may help with her abdominal pain.  All questions were answered, patient verbalized understanding and is in agreement with plan as outline above. \  Follow Up Instructions: TBD after procedures   I discussed the assessment and treatment plan with the patient. The patient was provided an opportunity to ask questions and all were answered. The patient agreed with the plan and demonstrated an understanding of the instructions.   The patient was advised to call back or seek an in-person evaluation if the symptoms worsen or if the condition fails to improve as anticipated.  I provided 20 minutes of discussion via telephone.  Bobie Kistler L. Alver Sorrow, MSN, APRN, AGNP-C Adult-Gerontology Nurse Practitioner [facility]

## 2021-04-14 NOTE — Patient Instructions (Signed)
We will get you set up for EGD and colonoscopy for further evaluation of your symptoms. You can take the dicyclomine that you were given in the ER up to three times per day for the abdominal pain. Please let me know if you develop worsening abdominal pain, vomiting, rectal bleeding or black stools.

## 2021-04-16 ENCOUNTER — Telehealth (INDEPENDENT_AMBULATORY_CARE_PROVIDER_SITE_OTHER): Payer: Self-pay

## 2021-04-16 ENCOUNTER — Other Ambulatory Visit (INDEPENDENT_AMBULATORY_CARE_PROVIDER_SITE_OTHER): Payer: Self-pay

## 2021-04-16 ENCOUNTER — Encounter (INDEPENDENT_AMBULATORY_CARE_PROVIDER_SITE_OTHER): Payer: Self-pay

## 2021-04-16 DIAGNOSIS — R140 Abdominal distension (gaseous): Secondary | ICD-10-CM

## 2021-04-16 DIAGNOSIS — R6881 Early satiety: Secondary | ICD-10-CM

## 2021-04-16 DIAGNOSIS — R1032 Left lower quadrant pain: Secondary | ICD-10-CM

## 2021-04-16 DIAGNOSIS — R194 Change in bowel habit: Secondary | ICD-10-CM

## 2021-04-16 MED ORDER — CLENPIQ 10-3.5-12 MG-GM -GM/160ML PO SOLN: 1.0000 | Freq: Once | ORAL | 0 refills | Status: AC

## 2021-04-16 NOTE — Telephone Encounter (Signed)
Sadrac Zeoli Ann Yuli Lanigan, CMA  ?

## 2021-05-08 ENCOUNTER — Telehealth (INDEPENDENT_AMBULATORY_CARE_PROVIDER_SITE_OTHER): Payer: Self-pay

## 2021-05-08 ENCOUNTER — Other Ambulatory Visit (INDEPENDENT_AMBULATORY_CARE_PROVIDER_SITE_OTHER): Payer: Self-pay | Admitting: Gastroenterology

## 2021-05-08 DIAGNOSIS — R110 Nausea: Secondary | ICD-10-CM

## 2021-05-08 DIAGNOSIS — K5792 Diverticulitis of intestine, part unspecified, without perforation or abscess without bleeding: Secondary | ICD-10-CM

## 2021-05-08 DIAGNOSIS — K589 Irritable bowel syndrome without diarrhea: Secondary | ICD-10-CM

## 2021-05-08 MED ORDER — ONDANSETRON HCL 4 MG PO TABS: 4.0000 mg | ORAL_TABLET | Freq: Three times a day (TID) | ORAL | 0 refills | Status: DC | PRN

## 2021-05-08 MED ORDER — DICYCLOMINE HCL 20 MG PO TABS: 20.0000 mg | ORAL_TABLET | Freq: Three times a day (TID) | ORAL | 1 refills | Status: DC | PRN

## 2021-05-08 NOTE — Telephone Encounter (Signed)
Medications sent to pharmacy

## 2021-05-08 NOTE — Telephone Encounter (Signed)
Patient called today asking for refills on her Ondansetron and Bentyl. She states she has ran out and needs refills as she started having some slight nausea and stomach cramping yesterday. Denies any other issues. Would like medication to be sent to [facility] Dr. Linna Hoff. Last seen by [facility] 04/14/2021 for early Satiety. Thanks ?

## 2021-05-11 NOTE — Telephone Encounter (Signed)
Patient aware of all.

## 2021-05-13 ENCOUNTER — Telehealth (INDEPENDENT_AMBULATORY_CARE_PROVIDER_SITE_OTHER): Payer: Self-pay

## 2021-05-13 MED ORDER — NA SULFATE-K SULFATE-MG SULF 17.5-3.13-1.6 GM/177ML PO SOLN: 1.0000 | Freq: Once | ORAL | 0 refills | Status: AC

## 2021-05-13 NOTE — Telephone Encounter (Signed)
Garlen Reinig Ann Jerzy Crotteau, CMA  ?

## 2021-05-15 ENCOUNTER — Ambulatory Visit (HOSPITAL_COMMUNITY)
Admission: RE | Admit: 2021-05-15 | Discharge: 2021-05-15 | Disposition: A | Discharge status: Home / Self Care | Payer: 59 | Attending: Gastroenterology | Admitting: Gastroenterology

## 2021-05-15 ENCOUNTER — Encounter (HOSPITAL_COMMUNITY)
Admission: RE | Disposition: A | Discharge status: Home / Self Care | Payer: Self-pay | Source: Home / Self Care | Attending: Gastroenterology

## 2021-05-15 ENCOUNTER — Encounter (HOSPITAL_COMMUNITY): Payer: Self-pay | Admitting: Gastroenterology

## 2021-05-15 ENCOUNTER — Ambulatory Visit (HOSPITAL_COMMUNITY): Payer: 59 | Admitting: Registered Nurse

## 2021-05-15 ENCOUNTER — Ambulatory Visit (HOSPITAL_BASED_OUTPATIENT_CLINIC_OR_DEPARTMENT_OTHER): Payer: 59 | Admitting: Registered Nurse

## 2021-05-15 ENCOUNTER — Other Ambulatory Visit: Payer: Self-pay

## 2021-05-15 DIAGNOSIS — K635 Polyp of colon: Secondary | ICD-10-CM

## 2021-05-15 DIAGNOSIS — K5730 Diverticulosis of large intestine without perforation or abscess without bleeding: Secondary | ICD-10-CM | POA: Insufficient documentation

## 2021-05-15 DIAGNOSIS — K2970 Gastritis, unspecified, without bleeding: Secondary | ICD-10-CM | POA: Diagnosis not present

## 2021-05-15 DIAGNOSIS — R1032 Left lower quadrant pain: Secondary | ICD-10-CM | POA: Diagnosis not present

## 2021-05-15 DIAGNOSIS — D132 Benign neoplasm of duodenum: Secondary | ICD-10-CM | POA: Diagnosis not present

## 2021-05-15 DIAGNOSIS — F17200 Nicotine dependence, unspecified, uncomplicated: Secondary | ICD-10-CM | POA: Insufficient documentation

## 2021-05-15 DIAGNOSIS — K3189 Other diseases of stomach and duodenum: Secondary | ICD-10-CM | POA: Insufficient documentation

## 2021-05-15 DIAGNOSIS — K219 Gastro-esophageal reflux disease without esophagitis: Secondary | ICD-10-CM | POA: Insufficient documentation

## 2021-05-15 DIAGNOSIS — K633 Ulcer of intestine: Secondary | ICD-10-CM | POA: Insufficient documentation

## 2021-05-15 DIAGNOSIS — R109 Unspecified abdominal pain: Secondary | ICD-10-CM

## 2021-05-15 DIAGNOSIS — R194 Change in bowel habit: Secondary | ICD-10-CM | POA: Diagnosis not present

## 2021-05-15 DIAGNOSIS — K2980 Duodenitis without bleeding: Secondary | ICD-10-CM | POA: Diagnosis not present

## 2021-05-15 DIAGNOSIS — F319 Bipolar disorder, unspecified: Secondary | ICD-10-CM | POA: Insufficient documentation

## 2021-05-15 DIAGNOSIS — R6881 Early satiety: Secondary | ICD-10-CM | POA: Insufficient documentation

## 2021-05-15 DIAGNOSIS — R140 Abdominal distension (gaseous): Secondary | ICD-10-CM | POA: Insufficient documentation

## 2021-05-15 DIAGNOSIS — D120 Benign neoplasm of cecum: Secondary | ICD-10-CM | POA: Insufficient documentation

## 2021-05-15 DIAGNOSIS — G40909 Epilepsy, unspecified, not intractable, without status epilepticus: Secondary | ICD-10-CM | POA: Insufficient documentation

## 2021-05-15 HISTORY — PX: POLYPECTOMY: SHX5525

## 2021-05-15 HISTORY — PX: ESOPHAGOGASTRODUODENOSCOPY (EGD) WITH PROPOFOL: SHX5813

## 2021-05-15 HISTORY — PX: COLONOSCOPY WITH PROPOFOL: SHX5780

## 2021-05-15 HISTORY — PX: BIOPSY: SHX5522

## 2021-05-15 LAB — HM COLONOSCOPY

## 2021-05-15 SURGERY — COLONOSCOPY WITH PROPOFOL
Anesthesia: General

## 2021-05-15 MED ORDER — EPHEDRINE 5 MG/ML INJ
INTRAVENOUS | Status: AC
  Filled 2021-05-15: qty 5

## 2021-05-15 MED ORDER — LACTATED RINGERS IV SOLN: INTRAVENOUS | Status: DC | PRN

## 2021-05-15 MED ORDER — LACTATED RINGERS IV SOLN: INTRAVENOUS | Status: DC

## 2021-05-15 MED ORDER — GLYCOPYRROLATE PF 0.2 MG/ML IJ SOSY
PREFILLED_SYRINGE | INTRAMUSCULAR | Status: AC
Start: 2021-05-15 — End: ?
  Filled 2021-05-15: qty 1

## 2021-05-15 MED ORDER — EPHEDRINE SULFATE-NACL 50-0.9 MG/10ML-% IV SOSY
PREFILLED_SYRINGE | INTRAVENOUS | Status: DC | PRN
  Administered 2021-05-15: 10 mg via INTRAVENOUS
  Administered 2021-05-15: 5 mg via INTRAVENOUS
  Administered 2021-05-15: 10 mg via INTRAVENOUS

## 2021-05-15 MED ORDER — PROPOFOL 500 MG/50ML IV EMUL
INTRAVENOUS | Status: DC | PRN
  Administered 2021-05-15: 200 ug/kg/min via INTRAVENOUS

## 2021-05-15 MED ORDER — PROPOFOL 500 MG/50ML IV EMUL
INTRAVENOUS | Status: AC
  Filled 2021-05-15: qty 50

## 2021-05-15 MED ORDER — PHENYLEPHRINE 40 MCG/ML (10ML) SYRINGE FOR IV PUSH (FOR BLOOD PRESSURE SUPPORT)
PREFILLED_SYRINGE | INTRAVENOUS | Status: AC
  Filled 2021-05-15: qty 10

## 2021-05-15 MED ORDER — PROPOFOL 10 MG/ML IV BOLUS
INTRAVENOUS | Status: AC
  Filled 2021-05-15: qty 20

## 2021-05-15 MED ORDER — PROPOFOL 10 MG/ML IV BOLUS
INTRAVENOUS | Status: DC | PRN
  Administered 2021-05-15: 100 mg via INTRAVENOUS
  Administered 2021-05-15: 40 mg via INTRAVENOUS

## 2021-05-15 NOTE — Discharge Instructions (Addendum)
You are being discharged to home.  ?Resume your previous diet.  ?Continue your present medications.  ?Will discuss possibility of endoscopic resection of duodenal polypoid lesion with Dr. Rush Landmark [facility]). ?Your physician has recommended a repeat colonoscopy for surveillance based on pathology results.  ?No high dose aspirin (goody/BC powders), ibuprofen, naproxen, or other non-steroidal anti-inflammatory drugs.  ?Continue pantoprazole 40 mg  ?

## 2021-05-15 NOTE — H&P (Signed)
[name] is an 66 y.o. female.   ?Chief Complaint: Bloating, early satiety and left lower quadrant abdominal pain ?HPI: 66 year old female with past medical history of anxiety, bipolar disorder, depression, diverticulitis, hyperlipidemia, who came to the hospital for evaluation of bloating, early satiety and pain in her left lower quadrant. ? ?Patient has been taking Bentyl as needed for abdominal pain but has not presented complete resolution of her abdominal pain, still presenting significant bloating.  Had negative cross-sectional abdominal imaging on 04/10/2021. ? ?Past Medical History:  ?Diagnosis Date  ? Anxiety   ? Anxiety state 01/01/2018  ? Bipolar 1 disorder ([place])   ? Bipolar disorder ([place]) 12/12/2013  ? Depression   ? Diverticulitis   ? Diverticulitis of colon 03/15/2016  ? Hyperlipidemia 06/12/2020  ? Insomnia 01/01/2018  ? Tobacco abuse 06/25/2016  ? Tubular adenoma 10/02/2018  ? ? ?Past Surgical History:  ?Procedure Laterality Date  ? COLONOSCOPY    ? COLONOSCOPY N/A 08/08/2019  ? Procedure: COLONOSCOPY;  Surgeon: Rogene Houston, MD;  Location: [facility];  Service: Endoscopy;  Laterality: N/A;  220  ? oophrorectomy    ? POLYPECTOMY  08/08/2019  ? Procedure: POLYPECTOMY;  Surgeon: Rogene Houston, MD;  Location: [facility];  Service: Endoscopy;;  ? TUBAL LIGATION    ? ? ?Family History  ?Problem Relation Age of Onset  ? Cancer Mother   ?     lichen planus - oral cancer  ? Cancer Father   ?     lung  ? Cancer Maternal Uncle   ?     uterine  ? Diabetes Maternal Uncle   ? Stroke Maternal Grandmother   ? COPD Paternal Grandfather   ? ?Social History:  reports that she has been smoking cigarettes. She has been smoking an average of .25 packs per day. She has never used smokeless tobacco. She reports current alcohol use of about 6.0 standard drinks per week. She reports that she does not use drugs. ? ?Allergies:  ?Allergies  ?Allergen Reactions  ? Meperidine Hives  ? 5-Alpha Reductase Inhibitors Other  (See Comments)  ?  unknown  ? ? ?Medications Prior to Admission  ?Medication Sig Dispense Refill  ? acetaminophen (TYLENOL) 500 MG tablet Take 1,000 mg by mouth every 6 (six) hours as needed for mild pain or moderate pain.    ? ALPRAZolam (XANAX) 0.5 MG tablet Take 1 tablet (0.5 mg total) by mouth 3 (three) times daily as needed. for anxiety (Patient taking differently: Take 0.5 mg by mouth 2 (two) times daily. for anxiety) 90 tablet 5  ? busPIRone (BUSPAR) 15 MG tablet Take 1 tablet (15 mg total) by mouth 2 (two) times daily. 180 tablet 3  ? Cyanocobalamin (VITAMIN B-12) 2500 MCG SUBL Place 2,500 mcg under the tongue in the morning.    ? dicyclomine (BENTYL) 20 MG tablet Take 1 tablet (20 mg total) by mouth 3 (three) times daily as needed (abdominal pain). 30 tablet 1  ? Folic Acid (FOLATE PO) Take 1,998 mcg by mouth daily. 666 mcg    ? lamoTRIgine (LAMICTAL) 100 MG tablet TAKE 2 TABLETS(200 MG) BY MOUTH TWICE DAILY (Patient taking differently: Take 200 mg by mouth 2 (two) times daily.) 120 tablet 0  ? pantoprazole (PROTONIX) 40 MG tablet Take 1 tablet (40 mg total) by mouth daily. 90 tablet 1  ? Probiotic Product (PROBIOTIC DAILY PO) Take 1 capsule by mouth daily. 1 million organism    ? traZODone (DESYREL) 100  MG tablet Take 1-2 tablets (100-200 mg total) by mouth at bedtime as needed for sleep. (Patient taking differently: Take 100 mg by mouth at bedtime.) 180 tablet 3  ? TURMERIC PO Take 500 mg by mouth in the morning.    ? albuterol (VENTOLIN HFA) 108 (90 Base) MCG/ACT inhaler INHALE 2 PUFFS INTO THE LUNGS EVERY 6 HOURS AS NEEDED FOR WHEEZING OR SHORTNESS OF BREATH 6.7 g 0  ? cetirizine (ZYRTEC ALLERGY) 10 MG tablet Take 1 tablet (10 mg total) by mouth daily. (Patient not taking: Reported on 05/14/2021) 30 tablet 0  ? ondansetron (ZOFRAN) 4 MG tablet Take 1 tablet (4 mg total) by mouth every 8 (eight) hours as needed for nausea or vomiting. (Patient not taking: Reported on 05/14/2021) 20 tablet 0  ? ? ?No results  found for this or any previous visit (from the past 48 hour(s)). ?No results found. ? ?Review of Systems ? ?Blood pressure (!) 103/58, temperature 98.3 ?F (36.8 ?C), temperature source Oral, resp. rate 13, height '5\' 7"'$  (1.702 m), weight 74.4 kg, SpO2 96 %. ?Physical Exam  ?GENERAL: The patient is AO x3, in no acute distress. ?HEENT: Head is normocephalic and atraumatic. EOMI are intact. Mouth is well hydrated and without lesions. ?NECK: Supple. No masses ?LUNGS: Clear to auscultation. No presence of rhonchi/wheezing/rales. Adequate chest expansion ?HEART: RRR, normal s1 and s2. ?ABDOMEN: Soft, nontender, no guarding, no peritoneal signs, and nondistended. BS +. No masses. ?EXTREMITIES: Without any cyanosis, clubbing, rash, lesions or edema. ?NEUROLOGIC: AOx3, no focal motor deficit. ?SKIN: no jaundice, no rashes ? ?Assessment/Plan ?66 year old female with past medical history of anxiety, bipolar disorder, depression, diverticulitis, hyperlipidemia, who came to the hospital for evaluation of bloating, early satiety and pain in her left lower quadrant.  We will proceed with EGD and colonoscopy. ? ?Harvel Quale, MD ?05/15/2021, 11:20 AM ? ? ? ?

## 2021-05-15 NOTE — Op Note (Signed)
[facility] ?Patient Name: [name] ?Procedure Date: 05/15/2021 11:22 AM ?MRN: [id number] ?Date of Birth: 01-22-1956 ?Attending MD: Maylon Peppers ,  ?CSN: [id number] ?Age: 66 ?Admit Type: Outpatient ?Procedure:                Colonoscopy ?Indications:              Abdominal pain in the left lower quadrant, Change  ?                          in bowel habits ?Providers:                Maylon Peppers, Hughie Closs RN, RN, Tammy  ?                          Vaught, RN, Randa Spike, Technician ?Referring MD:              ?Medicines:                Monitored Anesthesia Care ?Complications:            No immediate complications. ?Estimated Blood Loss:     Estimated blood loss: none. ?Procedure:                Pre-Anesthesia Assessment: ?                          - Prior to the procedure, a History and Physical  ?                          was performed, and patient medications, allergies  ?                          and sensitivities were reviewed. The patient's  ?                          tolerance of previous anesthesia was reviewed. ?                          - The risks and benefits of the procedure and the  ?                          sedation options and risks were discussed with the  ?                          patient. All questions were answered and informed  ?                          consent was obtained. ?                          - ASA Grade Assessment: II - A patient with mild  ?                          systemic disease. ?                          After obtaining informed consent, the colonoscope  ?  was passed under direct vision. Throughout the  ?                          procedure, the patient's blood pressure, pulse, and  ?                          oxygen saturations were monitored continuously. The  ?                          PCF-HQ190L ([id number]) was introduced through the  ?                          anus and advanced to the the terminal ileum. The  ?                           colonoscopy was performed without difficulty. The  ?                          patient tolerated the procedure well. The quality  ?                          of the bowel preparation was good. ?Scope In: 11:59:02 AM ?Scope Out: 12:26:05 PM ?Scope Withdrawal Time: 0 hours 22 minutes 59 seconds  ?Total Procedure Duration: 0 hours 27 minutes 3 seconds  ?Findings: ?     The perianal and digital rectal examinations were normal. ?     The terminal ileum appeared normal. ?     A single (solitary) ten mm ulcer was found at the ileocecal valve. There  ?     was presence of extensive inflammatory changes in the ilceocecal valve  ?     characterized by edema and erythema, but no clear cut dysplastic  ?     changes. No bleeding was present. No stigmata of recent bleeding were  ?     seen. Biopsies were taken with a cold forceps for histology. ?     Two sessile polyps were found in the descending colon and cecum. The  ?     polyps were 2 to 4 mm in size. These polyps were removed with a cold  ?     snare. Resection and retrieval were complete. ?     Multiple small and large-mouthed diverticula were found in the sigmoid  ?     colon, descending colon, transverse colon and ascending colon. ?     The retroflexed view of the distal rectum and anal verge was normal and  ?     showed no anal or rectal abnormalities. ?Impression:               - The examined portion of the ileum was normal. ?                          - A single (solitary) ulcer at the ileocecal valve.  ?                          Biopsied. ?                          -  Two 2 to 4 mm polyps in the descending colon and  ?                          in the cecum, removed with a cold snare. Resected  ?                          and retrieved. ?                          - Diverticulosis in the sigmoid colon, in the  ?                          descending colon, in the transverse colon and in  ?                          the ascending colon. ?                          -  The distal rectum and anal verge are normal on  ?                          retroflexion view. ?Moderate Sedation: ?     Per Anesthesia Care ?Recommendation:           - Discharge patient to home (ambulatory). ?                          - Resume previous diet. ?                          - Await pathology results. ?                          - Repeat colonoscopy for surveillance based on  ?                          pathology results. ?                          - No ibuprofen, naproxen, or other non-steroidal  ?                          anti-inflammatory drugs. ?Procedure Code(s):        --- Professional --- ?                          [phone], Colonoscopy, flexible; with removal of  ?                          tumor(s), polyp(s), or other lesion(s) by snare  ?                          technique ?                          [id number], 53, Colonoscopy, flexible; with biopsy,  ?  single or multiple ?Diagnosis Code(s):        --- Professional --- ?                          K63.3, Ulcer of intestine ?                          K63.5, Polyp of colon ?                          R10.32, Left lower quadrant pain ?                          R19.4, Change in bowel habit ?                          K57.30, Diverticulosis of large intestine without  ?                          perforation or abscess without bleeding ?CPT copyright 2019 American [employer] Association. All rights reserved. ?The codes documented in this report are preliminary and upon coder review may  ?be revised to meet current compliance requirements. ?Maylon Peppers, MD ?Maylon Peppers,  ?05/15/2021 12:39:27 PM ?This report has been signed electronically. ?Number of Addenda: 0 ?

## 2021-05-15 NOTE — Anesthesia Procedure Notes (Signed)
Date/Time: 05/15/2021 11:50 AM ?Performed by: Sharee Pimple, CRNA ?Pre-anesthesia Checklist: Emergency Drugs available, Suction available, Patient identified and Patient being monitored ?Patient Re-evaluated:Patient Re-evaluated prior to induction ?Oxygen Delivery Method: Nasal cannula ?Induction Type: IV induction ?Placement Confirmation: positive ETCO2 ? ? ? ? ?

## 2021-05-15 NOTE — Anesthesia Preprocedure Evaluation (Signed)
Anesthesia Evaluation  ?Patient identified by MRN, date of birth, ID band ?Patient awake ? ? ? ?Reviewed: ?Allergy & Precautions, H&P , NPO status , Patient's Chart, lab work & pertinent test results, reviewed documented beta blocker date and time  ? ?Airway ?Mallampati: II ? ?TM Distance: >3 FB ?Neck ROM: full ? ? ? Dental ?no notable dental hx. ? ?  ?Pulmonary ?neg pulmonary ROS, Current Smoker,  ?  ?Pulmonary exam normal ?breath sounds clear to auscultation ? ? ? ? ? ? Cardiovascular ?Exercise Tolerance: Good ?negative cardio ROS ? ? ?Rhythm:regular Rate:Normal ? ? ?  ?Neuro/Psych ?Seizures -,  PSYCHIATRIC DISORDERS Anxiety Depression Bipolar Disorder   ? GI/Hepatic ?Neg liver ROS, GERD  Medicated,  ?Endo/Other  ?negative endocrine ROS ? Renal/GU ?negative Renal ROS  ?negative genitourinary ?  ?Musculoskeletal ? ? Abdominal ?  ?Peds ? Hematology ?negative hematology ROS ?(+)   ?Anesthesia Other Findings ? ? Reproductive/Obstetrics ?negative OB ROS ? ?  ? ? ? ? ? ? ? ? ? ? ? ? ? ?  ?  ? ? ? ? ? ? ? ? ?Anesthesia Physical ?Anesthesia Plan ? ?ASA: 2 ? ?Anesthesia Plan: General  ? ?Post-op Pain Management:   ? ?Induction:  ? ?PONV Risk Score and Plan: Propofol infusion ? ?Airway Management Planned:  ? ?Additional Equipment:  ? ?Intra-op Plan:  ? ?Post-operative Plan:  ? ?Informed Consent: I have reviewed the patients History and Physical, chart, labs and discussed the procedure including the risks, benefits and alternatives for the proposed anesthesia with the patient or authorized representative who has indicated his/her understanding and acceptance.  ? ? ? ?Dental Advisory Given ? ?Plan Discussed with: CRNA ? ?Anesthesia Plan Comments:   ? ? ? ? ? ? ?Anesthesia Quick Evaluation ? ?

## 2021-05-15 NOTE — Transfer of Care (Signed)
Immediate Anesthesia Transfer of Care Note ? ?Patient: [name] ? ?Procedure(s) Performed: COLONOSCOPY WITH PROPOFOL ?ESOPHAGOGASTRODUODENOSCOPY (EGD) WITH PROPOFOL ?BIOPSY ?POLYPECTOMY ? ?Patient Location: PACU ? ?Anesthesia Type:MAC ? ?Level of Consciousness: awake and alert  ? ?Airway & Oxygen Therapy: Patient Spontanous Breathing ? ?Post-op Assessment: Report given to RN and Post -op Vital signs reviewed and stable ? ?Post vital signs: Reviewed and stable ? ?Last Vitals:  ?Vitals Value Taken Time  ?BP 93/58 05/15/21 1232  ?Temp 37 ?C 05/15/21 1232  ?Pulse 78 05/15/21 1232  ?Resp 16 05/15/21 1232  ?SpO2 98 % 05/15/21 1232  ? ? ?Last Pain:  ?Vitals:  ? 05/15/21 1232  ?TempSrc: Tympanic  ?PainSc:   ?   ? ?Patients Stated Pain Goal: 2 (05/15/21 1108) ? ?Complications: No notable events documented. ?

## 2021-05-15 NOTE — Op Note (Addendum)
[facility] ?Patient Name: [name] ?Procedure Date: 05/15/2021 11:23 AM ?MRN: [id number] ?Date of Birth: 09-18-1955 ?Attending MD: Maylon Peppers ,  ?CSN: [id number] ?Age: 66 ?Admit Type: Outpatient ?Procedure:                Upper GI endoscopy ?Indications:              Abdominal pain, Abdominal bloating, Early satiety ?Providers:                Maylon Peppers, Hughie Closs RN, RN, Tammy  ?                          Vaught, RN, Randa Spike, Technician ?Referring MD:              ?Medicines:                Monitored Anesthesia Care ?Complications:            No immediate complications. ?Estimated Blood Loss:     Estimated blood loss: none. ?Procedure:                Pre-Anesthesia Assessment: ?                          - Prior to the procedure, a History and Physical  ?                          was performed, and patient medications, allergies  ?                          and sensitivities were reviewed. The patient's  ?                          tolerance of previous anesthesia was reviewed. ?                          - The risks and benefits of the procedure and the  ?                          sedation options and risks were discussed with the  ?                          patient. All questions were answered and informed  ?                          consent was obtained. ?                          - ASA Grade Assessment: II - A patient with mild  ?                          systemic disease. ?                          After obtaining informed consent, the endoscope was  ?                          passed under direct  vision. Throughout the  ?                          procedure, the patient's blood pressure, pulse, and  ?                          oxygen saturations were monitored continuously. The  ?                          GIF-H190 ([id number]) scope was introduced through the  ?                          mouth, and advanced to the third part of duodenum.  ?                          The upper GI  endoscopy was accomplished without  ?                          difficulty. The patient tolerated the procedure  ?                          well. ?Scope In: 11:38:05 AM ?Scope Out: 11:52:46 AM ?Total Procedure Duration: 0 hours 14 minutes 41 seconds  ?Findings: ?     The Z-line was regular and was found 40 cm from the incisors. ?     The examined esophagus was normal. ?     Patchy mild inflammation characterized by congestion (edema) and  ?     erythema was found in the gastric body and in the gastric antrum.  ?     Biopsies were taken with a cold forceps for Helicobacter pylori testing. ?     Segmental moderate inflammation characterized by congestion (edema),  ?     erythema and granularity was found in the duodenal bulb. Biopsies were  ?     taken from the normal duodenum with a cold forceps for histology. ?     A medium-sized (2 cm, possibly involving 1/3 of circumference)  ?     frond-like/villous mass, consistent with adenoma with a few area of mild  ?     depression, with no bleeding was found in the second portion of the  ?     duodenum. Biopsies were taken with a cold forceps for histology. This  ?     polyp was located just distal to the ampulla. ?Impression:               - Z-line regular, 40 cm from the incisors. ?                          - Normal esophagus. ?                          - Gastritis. Biopsied. ?                          - Duodenitis. Biopsied. ?                          - Mass (suspected adenoma) in the second portion  of  ?                          the duodenum. Biopsied. ?Moderate Sedation: ?     Per Anesthesia Care ?Recommendation:           - Discharge patient to home (ambulatory). ?                          - Resume previous diet. ?                          - Continue present medications including  ?                          pantoprazole 40 mg qday. ?                          - Will discuss possibility of endoscopic resection  ?                          of duodenal polypoid lesion with  Dr. Rush Landmark  ?                          [facility]). ?Procedure Code(s):        --- Professional --- ?                          [id number], Esophagogastroduodenoscopy, flexible,  ?                          transoral; with biopsy, single or multiple ?Diagnosis Code(s):        --- Professional --- ?                          K29.70, Gastritis, unspecified, without bleeding ?                          K29.80, Duodenitis without bleeding ?                          K31.89, Other diseases of stomach and duodenum ?                          R10.9, Unspecified abdominal pain ?                          R14.0, Abdominal distension (gaseous) ?                          R68.81, Early satiety ?CPT copyright 2019 American Medical Association. All rights reserved. ?The codes documented in this report are preliminary and upon coder review may  ?be revised to meet current compliance requirements. ?Maylon Peppers, MD ?Maylon Peppers,  ?05/15/2021 12:33:17 PM ?This report has been signed electronically. ?Number of Addenda: 0 ?

## 2021-05-18 ENCOUNTER — Encounter (INDEPENDENT_AMBULATORY_CARE_PROVIDER_SITE_OTHER): Payer: Self-pay | Admitting: *Deleted

## 2021-05-18 LAB — H. PYLORI ANTIBODY, IGG: H Pylori IgG: 0.09 Index Value (ref 0.00–0.79)

## 2021-05-18 LAB — SURGICAL PATHOLOGY

## 2021-05-18 NOTE — Anesthesia Postprocedure Evaluation (Signed)
Anesthesia Post Note ? ?Patient: [name] ? ?Procedure(s) Performed: COLONOSCOPY WITH PROPOFOL ?ESOPHAGOGASTRODUODENOSCOPY (EGD) WITH PROPOFOL ?BIOPSY ?POLYPECTOMY ? ?Patient location during evaluation: Phase II ?Anesthesia Type: General ?Level of consciousness: awake ?Pain management: pain level controlled ?Vital Signs Assessment: post-procedure vital signs reviewed and stable ?Respiratory status: spontaneous breathing and respiratory function stable ?Cardiovascular status: blood pressure returned to baseline and stable ?Postop Assessment: no headache and no apparent nausea or vomiting ?Anesthetic complications: no ?Comments: Late entry ? ? ?No notable events documented. ? ? ?Last Vitals:  ?Vitals:  ? 05/15/21 1230 05/15/21 1232  ?BP: 91/60 (!) 93/58  ?Pulse: 86 78  ?Resp: 20 16  ?Temp: 36.6 ?C 37 ?C  ?SpO2: 95% 98%  ?  ?Last Pain:  ?Vitals:  ? 05/15/21 1232  ?TempSrc: Tympanic  ?PainSc:   ? ? ?  ?  ?  ?  ?  ?  ? ?Louann Sjogren ? ? ? ? ?

## 2021-05-20 ENCOUNTER — Encounter (HOSPITAL_COMMUNITY): Payer: Self-pay | Admitting: Gastroenterology

## 2021-06-23 ENCOUNTER — Encounter: Payer: Self-pay | Admitting: Gastroenterology

## 2021-06-23 ENCOUNTER — Ambulatory Visit (INDEPENDENT_AMBULATORY_CARE_PROVIDER_SITE_OTHER): Payer: 59 | Admitting: Gastroenterology

## 2021-06-23 ENCOUNTER — Other Ambulatory Visit (INDEPENDENT_AMBULATORY_CARE_PROVIDER_SITE_OTHER): Payer: 59

## 2021-06-23 VITALS — BP 110/80 | HR 81 | Ht 67.0 in | Wt 167.0 lb

## 2021-06-23 DIAGNOSIS — K317 Polyp of stomach and duodenum: Secondary | ICD-10-CM

## 2021-06-23 DIAGNOSIS — R933 Abnormal findings on diagnostic imaging of other parts of digestive tract: Secondary | ICD-10-CM

## 2021-06-23 NOTE — Progress Notes (Addendum)
? ?GASTROENTEROLOGY OUTPATIENT CLINIC VISIT  ? ?Primary Care Provider ?Noreene Larsson, NP (Inactive) ?[address] ?[place] [address] ?[phone] ? ?Referring Provider ?Dr. Jenetta Downer ? ? ?Patient Profile: ?[name] is a 66 y.o. female with a pmh significant for bipolar, anxiety, MDD, asthma, allergies, hyperlipidemia diverticulosis (with prior diverticulitis), colon polyps (TAs), duodenal polyp (large D2/D3 TA left in situ).  The patient presents to the [facility] for an evaluation and management of problem(s) noted below: ? ?Problem List ?1. Polyp of duodenum   ?2. Abnormal endoscopy of upper gastrointestinal tract   ? ? ?History of Present Illness ?This is the patient's first visit to the outpatient [facility] clinic.  The patient was previously evaluated by Dr. Synetta Fail GI for further evaluation of abdominal discomfort and bloating.  She is also due for colon cancer screening.  She underwent an upper and lower endoscopy at that time.  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  For this particular reason, the patient is referred for discussion at attempted advanced endoscopic resection.  The patient's previous symptoms have improved since her endoscopy and colonoscopy.  She does not take significant nonsteroidals or BC/Goody powders.  There is no family history of GI malignancy that she is aware of. ? ?GI Review of Systems ?Positive as above ?Negative for dysphagia, odynophagia, nausea, vomiting, change in bowel habits, melena, hematochezia ? ?Review of Systems ?General: Denies fevers/chills/weight loss unintentionally ?Cardiovascular: Denies chest pain ?Pulmonary: Denies shortness of breath ?Gastroenterological: See HPI ?Genitourinary: Denies darkened urine ?Hematological: Denies easy bruising/bleeding ?Dermatological: Denies jaundice ?Psychological: Mood is stable ? ? ?Medications ?Current  Outpatient Medications  ?Medication Sig Dispense Refill  ? acetaminophen (TYLENOL) 500 MG tablet Take 1,000 mg by mouth every 6 (six) hours as needed for mild pain or moderate pain.    ? albuterol (VENTOLIN HFA) 108 (90 Base) MCG/ACT inhaler INHALE 2 PUFFS INTO THE LUNGS EVERY 6 HOURS AS NEEDED FOR WHEEZING OR SHORTNESS OF BREATH 6.7 g 0  ? ALPRAZolam (XANAX) 0.5 MG tablet Take 1 tablet (0.5 mg total) by mouth 3 (three) times daily as needed. for anxiety (Patient taking differently: Take 0.5 mg by mouth 2 (two) times daily. for anxiety) 90 tablet 5  ? busPIRone (BUSPAR) 15 MG tablet Take 1 tablet (15 mg total) by mouth 2 (two) times daily. 180 tablet 3  ? cetirizine (ZYRTEC ALLERGY) 10 MG tablet Take 1 tablet (10 mg total) by mouth daily. 30 tablet 0  ? Cyanocobalamin (VITAMIN B-12) 2500 MCG SUBL Place 2,500 mcg under the tongue in the morning.    ? dicyclomine (BENTYL) 20 MG tablet Take 1 tablet (20 mg total) by mouth 3 (three) times daily as needed (abdominal pain). 30 tablet 1  ? Folic Acid (FOLATE PO) Take 1,998 mcg by mouth daily. 666 mcg    ? lamoTRIgine (LAMICTAL) 100 MG tablet TAKE 2 TABLETS(200 MG) BY MOUTH TWICE DAILY (Patient taking differently: Take 200 mg by mouth 2 (two) times daily.) 120 tablet 0  ? pantoprazole (PROTONIX) 40 MG tablet Take 1 tablet (40 mg total) by mouth daily. 90 tablet 1  ? Probiotic Product (PROBIOTIC DAILY PO) Take 1 capsule by mouth daily. 1 million organism    ? traZODone (DESYREL) 100 MG tablet Take 1-2 tablets (100-200 mg total) by mouth at bedtime as needed for sleep. (Patient taking differently: Take 100 mg by mouth at bedtime.) 180 tablet  3  ? TURMERIC PO Take 500 mg by mouth in the morning.    ? ?No current facility-administered medications for this visit.  ? ? ?Allergies ?Allergies  ?Allergen Reactions  ? Meperidine Hives  ? 5-Alpha Reductase Inhibitors Other (See Comments)  ?  unknown  ? ? ?Histories ?Past Medical History:  ?Diagnosis Date  ? Anxiety   ? Anxiety state  01/01/2018  ? Bipolar 1 disorder ([place])   ? Bipolar disorder ([place]) 12/12/2013  ? Depression   ? Diverticulitis   ? Diverticulitis of colon 03/15/2016  ? Hyperlipidemia 06/12/2020  ? Insomnia 01/01/2018  ? Tobacco abuse 06/25/2016  ? Tubular adenoma 10/02/2018  ? ?Past Surgical History:  ?Procedure Laterality Date  ? BIOPSY  05/15/2021  ? Procedure: BIOPSY;  Surgeon: Harvel Quale, MD;  [facility];  Service: Gastroenterology;;  ? COLONOSCOPY    ? COLONOSCOPY N/A 08/08/2019  ? Procedure: COLONOSCOPY;  Surgeon: Rogene Houston, MD;  [facility];  Service: Endoscopy;  Laterality: N/A;  220  ? COLONOSCOPY WITH PROPOFOL N/A 05/15/2021  ? Procedure: COLONOSCOPY WITH PROPOFOL;  Surgeon: Harvel Quale, MD;  [facility];  Service: Gastroenterology;  Laterality: N/A;  1200  ? ESOPHAGOGASTRODUODENOSCOPY (EGD) WITH PROPOFOL N/A 05/15/2021  ? Procedure: ESOPHAGOGASTRODUODENOSCOPY (EGD) WITH PROPOFOL;  Surgeon: Harvel Quale, MD;  [facility];  Service: Gastroenterology;  Laterality: N/A;  ? oophrorectomy    ? POLYPECTOMY  08/08/2019  ? Procedure: POLYPECTOMY;  Surgeon: Rogene Houston, MD;  [facility];  Service: Endoscopy;;  ? POLYPECTOMY  05/15/2021  ? Procedure: POLYPECTOMY;  Surgeon: Harvel Quale, MD;  [facility];  Service: Gastroenterology;;  ? TUBAL LIGATION    ? ?Social History  ? ?Socioeconomic History  ? Marital status: Divorced  ?  Spouse name: Not on file  ? Number of children: 2  ? Years of education: 68  ? Highest education level: Not on file  ?Occupational History  ? Occupation: Psychologist, sport and exercise  ?  Comment: [employer], [place]  ?Tobacco Use  ? Smoking status: Every Day  ?  Packs/day: 0.25  ?  Types: Cigarettes  ?  Last attempt to quit: 12/19/2017  ?  Years since quitting: 3.5  ? Smokeless tobacco: Never  ? Tobacco comments:  ?  none in 9 months. after smoking 1 pack a day x 20 yrs.  ?Vaping Use  ?  Vaping Use: Never used  ?Substance and Sexual Activity  ? Alcohol use: Yes  ?  Alcohol/week: 6.0 standard drinks  ?  Types: 2 Glasses of wine, 4 Cans of beer per week  ?  Comment: week  ? Drug use: No  ? Sexual activity: Not Currently  ?  Birth control/protection: Post-menopausal  ?Other Topics Concern  ? Not on file  ?Social History Narrative  ? Lives alone  ? She has 2 sons that are close by.  ?   ? She enjoys going out to dinner and spend time with family.  ?   ? Diet reports eating all foods.  ? Caffeine none  ? Water 6 to 8 cups regularly daily.  ?   ? ?Social Determinants of Health  ? ?Financial Resource Strain: Not on file  ?Food Insecurity: Not on file  ?Transportation Needs: Not on file  ?Physical Activity: Not on file  ?Stress: Not on file  ?Social Connections: Not on file  ?Intimate Partner Violence: Not on file  ? ?Family History  ?  Problem Relation Age of Onset  ? Cancer Mother   ?     lichen planus - oral cancer  ? Cancer Father   ?     lung  ? Cancer Maternal Uncle   ?     uterine  ? Diabetes Maternal Uncle   ? Stroke Maternal Grandmother   ? COPD Paternal Grandfather   ? Colon cancer Neg Hx   ? Esophageal cancer Neg Hx   ? Stomach cancer Neg Hx   ? Liver cancer Neg Hx   ? Pancreatic cancer Neg Hx   ? ?I have reviewed her medical, social, and family history in detail and updated the electronic medical record as necessary.  ? ? ?PHYSICAL EXAMINATION  ?BP 110/80   Pulse 81   Ht '5\' 7"'$  (1.702 m)   Wt 167 lb (75.8 kg)   SpO2 95%   BMI 26.16 kg/m?  ?Wt Readings from Last 3 Encounters:  ?06/23/21 167 lb (75.8 kg)  ?05/15/21 164 lb (74.4 kg)  ?04/14/21 169 lb (76.7 kg)  ?GEN: NAD, appears stated age, doesn't appear chronically ill, accompanied by friend ?PSYCH: Cooperative, without pressured speech ?EYE: Conjunctivae pink, sclerae anicteric ?ENT: MMM ?CV: Nontachycardic ?RESP: No audible wheezing ?GI: NABS, soft, NT/ND, without rebound ?MSK/EXT: No lower extremity edema ?SKIN: No jaundice ?NEURO:  Alert &  Oriented x 3, no focal deficits ? ? ?REVIEW OF DATA  ?I reviewed the following data at the time of this encounter: ? ?GI Procedures and Studies  ?March 2023 colonoscopy ?- Z-line regular, 40 cm from the inc

## 2021-06-23 NOTE — Patient Instructions (Signed)
Your provider has requested that you go to the basement level for lab work before leaving today. Press "B" on the elevator. The lab is located at the first door on the left as you exit the elevator.  You have been scheduled for an endoscopy. Please follow written instructions given to you at your visit today. If you use inhalers (even only as needed), please bring them with you on the day of your procedure.  Thank you for choosing me and [facility].  Dr. Mansouraty  

## 2021-06-24 LAB — PROTIME-INR
INR: 0.8 ratio (ref 0.8–1.0)
Prothrombin Time: 9.2 s — ABNORMAL LOW (ref 9.6–13.1)

## 2021-06-24 LAB — BASIC METABOLIC PANEL
BUN: 9 mg/dL (ref 6–23)
CO2: 26 mEq/L (ref 19–32)
Calcium: 9.5 mg/dL (ref 8.4–10.5)
Chloride: 104 mEq/L (ref 96–112)
Creatinine, Ser: 0.87 mg/dL (ref 0.40–1.20)
GFR: 70.36 mL/min (ref >60.00)
Glucose, Bld: 94 mg/dL (ref 70–99)
Potassium: 4.3 mEq/L (ref 3.5–5.1)
Sodium: 141 mEq/L (ref 135–145)

## 2021-06-24 LAB — CBC
HCT: 42.9 % (ref 36.0–46.0)
Hemoglobin: 14.9 g/dL (ref 12.0–15.0)
MCHC: 34.8 g/dL (ref 30.0–36.0)
MCV: 102.7 fl — ABNORMAL HIGH (ref 78.0–100.0)
Platelets: 275 10*3/uL (ref 150.0–400.0)
RBC: 4.18 Mil/uL (ref 3.87–5.11)
RDW: 13.7 % (ref 11.5–15.5)
WBC: 6 10*3/uL (ref 4.0–10.5)

## 2021-06-26 DIAGNOSIS — K317 Polyp of stomach and duodenum: Secondary | ICD-10-CM | POA: Insufficient documentation

## 2021-06-26 DIAGNOSIS — R933 Abnormal findings on diagnostic imaging of other parts of digestive tract: Secondary | ICD-10-CM | POA: Insufficient documentation

## 2021-06-29 ENCOUNTER — Telehealth: Payer: Self-pay | Admitting: Gastroenterology

## 2021-06-29 NOTE — Telephone Encounter (Signed)
Patient called to discuss results please advise. ?

## 2021-06-30 ENCOUNTER — Other Ambulatory Visit: Payer: Self-pay

## 2021-06-30 DIAGNOSIS — D7589 Other specified diseases of blood and blood-forming organs: Secondary | ICD-10-CM

## 2021-06-30 NOTE — Telephone Encounter (Signed)
See patient result note.  ?

## 2021-06-30 NOTE — Telephone Encounter (Signed)
Patient called waiting to speak with you, please advise.  ?

## 2021-08-06 ENCOUNTER — Telehealth: Payer: Self-pay | Admitting: Gastroenterology

## 2021-08-06 NOTE — Telephone Encounter (Signed)
The pt has been advsied that she can go to any [facility] facility she wishes to have the labs completed.  The pt has been advised of the information and verbalized understanding.

## 2021-08-06 NOTE — Telephone Encounter (Signed)
Inbound call from patient stating that Dr. Rush Landmark ordered for her to have R83 folic deficiency blood work done and that she could have it done at our lab or at her PCP office. Patient stated that her PCP said she could not have It done with them since they did not place the order. Patient is seeking advice if there is anyway the order can be sent to a lab closer to [place]. Please advise.

## 2021-08-10 ENCOUNTER — Telehealth: Payer: Self-pay | Admitting: Gastroenterology

## 2021-08-10 NOTE — Telephone Encounter (Signed)
Inbound call from [facility] stating patient came in but they do not have orders for the patient. Keene Breath can be reached at [phone] to call back to advise.  Thank You

## 2021-08-10 NOTE — Telephone Encounter (Signed)
I have faxed the order to [facility] for the labs with the fax number for results.

## 2021-08-12 ENCOUNTER — Encounter (HOSPITAL_COMMUNITY): Payer: Self-pay | Admitting: Gastroenterology

## 2021-08-12 NOTE — Progress Notes (Signed)
Attempted to obtain medical history via telephone, unable to reach at this time. HIPAA compliant voicemail message left requesting return call to pre surgical testing department. 

## 2021-08-17 ENCOUNTER — Other Ambulatory Visit: Payer: Self-pay | Admitting: Physician Assistant

## 2021-08-18 ENCOUNTER — Other Ambulatory Visit: Payer: Self-pay | Admitting: Gastroenterology

## 2021-08-18 NOTE — Telephone Encounter (Signed)
Please schedule appt

## 2021-08-19 LAB — B12 AND FOLATE PANEL
Folate: >20 ng/mL (ref >3.0)
Vitamin B-12: 1576 pg/mL — ABNORMAL HIGH (ref 232–1245)

## 2021-08-20 NOTE — Telephone Encounter (Signed)
Pt called in about script that had not been received back by pharmacy.  Pls send Lamotrigine to [facility] in [place].  She is out of meds.  Next appt 7/17

## 2021-08-21 ENCOUNTER — Telehealth: Payer: Self-pay | Admitting: Gastroenterology

## 2021-08-21 ENCOUNTER — Other Ambulatory Visit: Payer: Self-pay

## 2021-08-21 NOTE — Telephone Encounter (Signed)
The pt has been advised of the new arrival time of 6 am on Monday for her procedure.

## 2021-08-21 NOTE — Telephone Encounter (Signed)
Inbound call from patient stating she was returning your call. Please advise.

## 2021-08-23 NOTE — Anesthesia Preprocedure Evaluation (Addendum)
Anesthesia Evaluation  Patient identified by MRN, date of birth, ID band Patient awake    Reviewed: Allergy & Precautions, NPO status , Patient's Chart, lab work & pertinent test results  Airway Mallampati: II  TM Distance: >3 FB Neck ROM: Full    Dental no notable dental hx.    Pulmonary neg pulmonary ROS, Current Smoker and Patient abstained from smoking.,    Pulmonary exam normal        Cardiovascular negative cardio ROS   Rhythm:Regular Rate:Normal     Neuro/Psych Seizures -,  Anxiety Depression Bipolar Disorder    GI/Hepatic Neg liver ROS, GERD  Medicated,Duodenal polyp   Endo/Other  negative endocrine ROS  Renal/GU negative Renal ROS  negative genitourinary   Musculoskeletal negative musculoskeletal ROS (+)   Abdominal Normal abdominal exam  (+)   Peds  Hematology negative hematology ROS (+)   Anesthesia Other Findings   Reproductive/Obstetrics                           Anesthesia Physical Anesthesia Plan  ASA: 3  Anesthesia Plan: MAC   Post-op Pain Management:    Induction: Intravenous  PONV Risk Score and Plan: 1 and Propofol infusion and Treatment may vary due to age or medical condition  Airway Management Planned: Simple Face Mask, Natural Airway and Nasal Cannula  Additional Equipment: None  Intra-op Plan:   Post-operative Plan:   Informed Consent: I have reviewed the patients History and Physical, chart, labs and discussed the procedure including the risks, benefits and alternatives for the proposed anesthesia with the patient or authorized representative who has indicated his/her understanding and acceptance.     Dental advisory given  Plan Discussed with: CRNA  Anesthesia Plan Comments: (Lab Results      Component                Value               Date                      WBC                      6.0                 06/23/2021                HGB                       14.9                06/23/2021                HCT                      42.9                06/23/2021                MCV                      102.7 (H)           06/23/2021                PLT  275.0               06/23/2021           Lab Results      Component                Value               Date                      NA                       141                 06/23/2021                K                        4.3                 06/23/2021                CO2                      26                  06/23/2021                GLUCOSE                  94                  06/23/2021                BUN                      9                   06/23/2021                CREATININE               0.87                06/23/2021                CALCIUM                  9.5                 06/23/2021                EGFR                     89                  06/10/2020                GFRNONAA                 >60                 04/10/2021          )       Anesthesia Quick Evaluation

## 2021-08-24 ENCOUNTER — Other Ambulatory Visit: Payer: Self-pay

## 2021-08-24 ENCOUNTER — Ambulatory Visit (HOSPITAL_COMMUNITY): Payer: 59 | Admitting: Anesthesiology

## 2021-08-24 ENCOUNTER — Encounter (HOSPITAL_COMMUNITY): Payer: Self-pay | Admitting: Gastroenterology

## 2021-08-24 ENCOUNTER — Ambulatory Visit (HOSPITAL_COMMUNITY)
Admission: RE | Admit: 2021-08-24 | Discharge: 2021-08-24 | Disposition: A | Discharge status: Home / Self Care | Payer: 59 | Attending: Gastroenterology | Admitting: Gastroenterology

## 2021-08-24 ENCOUNTER — Ambulatory Visit (HOSPITAL_BASED_OUTPATIENT_CLINIC_OR_DEPARTMENT_OTHER): Payer: 59 | Admitting: Anesthesiology

## 2021-08-24 ENCOUNTER — Encounter (HOSPITAL_COMMUNITY)
Admission: RE | Disposition: A | Discharge status: Home / Self Care | Payer: Self-pay | Source: Home / Self Care | Attending: Gastroenterology

## 2021-08-24 DIAGNOSIS — D132 Benign neoplasm of duodenum: Secondary | ICD-10-CM | POA: Diagnosis present

## 2021-08-24 DIAGNOSIS — K2289 Other specified disease of esophagus: Secondary | ICD-10-CM | POA: Insufficient documentation

## 2021-08-24 DIAGNOSIS — K317 Polyp of stomach and duodenum: Secondary | ICD-10-CM | POA: Insufficient documentation

## 2021-08-24 DIAGNOSIS — K3189 Other diseases of stomach and duodenum: Secondary | ICD-10-CM | POA: Insufficient documentation

## 2021-08-24 DIAGNOSIS — K222 Esophageal obstruction: Secondary | ICD-10-CM | POA: Diagnosis not present

## 2021-08-24 DIAGNOSIS — K449 Diaphragmatic hernia without obstruction or gangrene: Secondary | ICD-10-CM | POA: Insufficient documentation

## 2021-08-24 DIAGNOSIS — F17210 Nicotine dependence, cigarettes, uncomplicated: Secondary | ICD-10-CM | POA: Diagnosis not present

## 2021-08-24 DIAGNOSIS — K219 Gastro-esophageal reflux disease without esophagitis: Secondary | ICD-10-CM

## 2021-08-24 HISTORY — PX: SUBMUCOSAL LIFTING INJECTION: SHX6855

## 2021-08-24 HISTORY — PX: ENDOSCOPIC MUCOSAL RESECTION: SHX6839

## 2021-08-24 HISTORY — PX: HEMOSTASIS CLIP PLACEMENT: SHX6857

## 2021-08-24 HISTORY — PX: BIOPSY: SHX5522

## 2021-08-24 HISTORY — PX: SUBMUCOSAL TATTOO INJECTION: SHX6856

## 2021-08-24 HISTORY — PX: HOT HEMOSTASIS: SHX5433

## 2021-08-24 HISTORY — PX: ESOPHAGOGASTRODUODENOSCOPY (EGD) WITH PROPOFOL: SHX5813

## 2021-08-24 SURGERY — ESOPHAGOGASTRODUODENOSCOPY (EGD) WITH PROPOFOL
Anesthesia: Monitor Anesthesia Care

## 2021-08-24 MED ORDER — SPOT INK MARKER SYRINGE KIT
PACK | SUBMUCOSAL | Status: DC | PRN
  Administered 2021-08-24: 1 mL via SUBMUCOSAL

## 2021-08-24 MED ORDER — PROPOFOL 1000 MG/100ML IV EMUL
INTRAVENOUS | Status: AC
  Filled 2021-08-24: qty 100

## 2021-08-24 MED ORDER — PANTOPRAZOLE SODIUM 40 MG PO TBEC: 40.0000 mg | DELAYED_RELEASE_TABLET | Freq: Two times a day (BID) | ORAL | 12 refills | Status: DC

## 2021-08-24 MED ORDER — ONDANSETRON HCL 4 MG/2ML IJ SOLN
INTRAMUSCULAR | Status: DC | PRN
  Administered 2021-08-24: 4 mg via INTRAVENOUS

## 2021-08-24 MED ORDER — DEXMEDETOMIDINE (PRECEDEX) IN NS 20 MCG/5ML (4 MCG/ML) IV SYRINGE
PREFILLED_SYRINGE | INTRAVENOUS | Status: AC
  Filled 2021-08-24: qty 5

## 2021-08-24 MED ORDER — PROPOFOL 10 MG/ML IV BOLUS
INTRAVENOUS | Status: DC | PRN
  Administered 2021-08-24 (×4): 20 mg via INTRAVENOUS
  Administered 2021-08-24: 50 mg via INTRAVENOUS
  Administered 2021-08-24 (×4): 20 mg via INTRAVENOUS
  Administered 2021-08-24: 50 mg via INTRAVENOUS

## 2021-08-24 MED ORDER — PROPOFOL 500 MG/50ML IV EMUL
INTRAVENOUS | Status: DC | PRN
  Administered 2021-08-24: 100 ug/kg/min via INTRAVENOUS

## 2021-08-24 MED ORDER — SODIUM CHLORIDE 0.9 % IV SOLN: INTRAVENOUS | Status: DC

## 2021-08-24 MED ORDER — LACTATED RINGERS IV SOLN: INTRAVENOUS | Status: DC

## 2021-08-24 MED ORDER — EPINEPHRINE 1 MG/10ML IJ SOSY
PREFILLED_SYRINGE | INTRAMUSCULAR | Status: AC
  Filled 2021-08-24: qty 10

## 2021-08-24 MED ORDER — SPOT INK MARKER SYRINGE KIT
PACK | SUBMUCOSAL | Status: AC
  Filled 2021-08-24: qty 5

## 2021-08-24 MED ORDER — SUCRALFATE 1 G PO TABS: 1.0000 g | ORAL_TABLET | Freq: Two times a day (BID) | ORAL | 0 refills | Status: DC

## 2021-08-24 SURGICAL SUPPLY — 15 items

## 2021-08-24 NOTE — Discharge Instructions (Addendum)
YOU HAD AN ENDOSCOPIC PROCEDURE TODAY: Refer to the procedure report and other information in the discharge instructions given to you for any specific questions about what was found during the examination. If this information does not answer your questions, please call [place] office at [phone] to clarify.   YOU SHOULD EXPECT: Some feelings of bloating in the abdomen. Passage of more gas than usual. Walking can help get rid of the air that was put into your GI tract during the procedure and reduce the bloating. If you had a lower endoscopy (such as a colonoscopy or flexible sigmoidoscopy) you may notice spotting of blood in your stool or on the toilet paper. Some abdominal soreness may be present for a day or two, also.  DIET: Your first meal following the procedure should be a light meal and then it is ok to progress to your normal diet. A half-sandwich or bowl of soup is an example of a good first meal. Heavy or fried foods are harder to digest and may make you feel nauseous or bloated. Drink plenty of fluids but you should avoid alcoholic beverages for 24 hours. If you had a esophageal dilation, please see attached instructions for diet.    ACTIVITY: Your care partner should take you home directly after the procedure. You should plan to take it easy, moving slowly for the rest of the day. You can resume normal activity the day after the procedure however YOU SHOULD NOT DRIVE, use power tools, machinery or perform tasks that involve climbing or major physical exertion for 24 hours (because of the sedation medicines used during the test).   SYMPTOMS TO REPORT IMMEDIATELY: A gastroenterologist can be reached at any hour. Please call [phone]  for any of the following symptoms:  Following upper endoscopy (EGD, EUS, ERCP, esophageal dilation) Vomiting of blood or coffee ground material  New, significant abdominal pain  New, significant chest pain or pain under the shoulder blades  Painful or  persistently difficult swallowing  New shortness of breath  Black, tarry-looking or red, bloody stools  FOLLOW UP:  If any biopsies were taken you will be contacted by phone or by letter within the next 1-3 weeks. Call [phone]  if you have not heard about the biopsies in 3 weeks.  Please also call with any specific questions about appointments or follow up tests.      [facility] ENDOSCOPY [place], [place]  [address] Phone:  [phone]   August 24, 2021  Patient: [name]  Date of Birth: Jan 19, 1956  Date of Visit: August 24, 2021    To Whom It May Concern:  [name] was seen and treated on August 24, 2021  at [facility] . Patient needs to be out of work on August 24, 2021 and August 25, 2021. Patient may return to work  and normal activity on Wednesday August 26, 2021   .           If you have any questions or concerns, please don't hesitate to call [phone].   Sincerely,    [facility]   Treatment Team:  Attending Provider: Mansouraty, Telford Nab., MD

## 2021-08-24 NOTE — H&P (Addendum)
GASTROENTEROLOGY PROCEDURE H&P NOTE   Primary Care Physician: Shelia Larsson, NP  HPI: [name] is a 66 y.o. female who presents for EGD with possible EUS for attempt at duodenal adenoma resection, hopefully not involving the ampulla.  Past Medical History:  Diagnosis Date   Anxiety    Anxiety state 01/01/2018   Bipolar 1 disorder ([place])    Bipolar disorder ([place]) 12/12/2013   Depression    Diverticulitis    Diverticulitis of colon 03/15/2016   Hyperlipidemia 06/12/2020   Insomnia 01/01/2018   Tobacco abuse 06/25/2016   Tubular adenoma 10/02/2018   Past Surgical History:  Procedure Laterality Date   BIOPSY  05/15/2021   Procedure: BIOPSY;  Surgeon: Harvel Quale, MD;  [facility];  Service: Gastroenterology;;   COLONOSCOPY     COLONOSCOPY N/A 08/08/2019   Procedure: COLONOSCOPY;  Surgeon: Rogene Houston, MD;  [facility];  Service: Endoscopy;  Laterality: N/A;  220   COLONOSCOPY WITH PROPOFOL N/A 05/15/2021   Procedure: COLONOSCOPY WITH PROPOFOL;  Surgeon: Harvel Quale, MD;  [facility];  Service: Gastroenterology;  Laterality: N/A;  1200   ESOPHAGOGASTRODUODENOSCOPY (EGD) WITH PROPOFOL N/A 05/15/2021   Procedure: ESOPHAGOGASTRODUODENOSCOPY (EGD) WITH PROPOFOL;  Surgeon: Harvel Quale, MD;  [facility];  Service: Gastroenterology;  Laterality: N/A;   oophrorectomy     POLYPECTOMY  08/08/2019   Procedure: POLYPECTOMY;  Surgeon: Rogene Houston, MD;  [facility];  Service: Endoscopy;;   POLYPECTOMY  05/15/2021   Procedure: POLYPECTOMY;  Surgeon: Harvel Quale, MD;  [facility];  Service: Gastroenterology;;   TUBAL LIGATION     Current Facility-Administered Medications  Medication Dose Route Frequency Provider Last Rate Last Admin   0.9 %  sodium chloride infusion   Intravenous Continuous Mansouraty, Telford Nab., MD       lactated ringers infusion    Intravenous Continuous Mansouraty, Telford Nab., MD 10 mL/hr at 08/24/21 0715 New Bag at 08/24/21 0715    Current Facility-Administered Medications:    0.9 %  sodium chloride infusion, , Intravenous, Continuous, Mansouraty, Telford Nab., MD   lactated ringers infusion, , Intravenous, Continuous, Mansouraty, Telford Nab., MD, Last Rate: 10 mL/hr at 08/24/21 0715, New Bag at 08/24/21 0715 Allergies  Allergen Reactions   Meperidine Hives   5-Alpha Reductase Inhibitors Other (See Comments)    unknown   Family History  Problem Relation Age of Onset   Cancer Mother        lichen planus - oral cancer   Cancer Father        lung   Cancer Maternal Uncle        uterine   Diabetes Maternal Uncle    Stroke Maternal Grandmother    COPD Paternal Grandfather    Colon cancer Neg Hx    Esophageal cancer Neg Hx    Stomach cancer Neg Hx    Liver cancer Neg Hx    Pancreatic cancer Neg Hx    Social History   Socioeconomic History   Marital status: Divorced    Spouse name: Not on file   Number of children: 2   Years of education: 13   Highest education level: Not on file  Occupational History   Occupation: Psychologist, 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[employer], [place]  Tobacco Use   Smoking status: Every Day    Packs/day: 0.25    Types: Cigarettes    Last attempt to quit:  12/19/2017    Years since quitting: 3.6   Smokeless tobacco: Never   Tobacco comments:    none in 9 months. after smoking 1 pack a day x 20 yrs.  Vaping Use   Vaping Use: Never used  Substance and Sexual Activity   Alcohol use: Yes    Alcohol/week: 6.0 standard drinks of alcohol    Types: 2 Glasses of wine, 4 Cans of beer per week    Comment: week   Drug use: No   Sexual activity: Not Currently    Birth control/protection: Post-menopausal  Other Topics Concern   Not on file  Social History Narrative   Lives alone   She has 2 sons that are close by.      She enjoys going out to dinner and spend time with family.       Diet reports eating all foods.   Caffeine none   Water 6 to 8 cups regularly daily.      Social Determinants of Health   Financial Resource Strain: Not on file  Food Insecurity: Not on file  Transportation Needs: Not on file  Physical Activity: Not on file  Stress: Not on file  Social Connections: Not on file  Intimate Partner Violence: Not on file    Physical Exam: Today's Vitals   08/21/21 1442 08/24/21 0700  BP:  119/69  Pulse:  62  Resp:  18  Temp:  98.1 F (36.7 C)  TempSrc:  Temporal  SpO2:  93%  Weight: 71.2 kg 71.7 kg  Height: '5\' 7"'$  (1.702 m) '5\' 7"'$  (1.702 m)  PainSc:  0-No pain   Body mass index is 24.75 kg/m. GEN: NAD EYE: Sclerae anicteric ENT: MMM CV: Non-tachycardic GI: Soft, NT/ND NEURO:  Alert & Oriented x 3  Lab Results: No results for input(s): "WBC", "HGB", "HCT", "PLT" in the last 72 hours. BMET No results for input(s): "NA", "K", "CL", "CO2", "GLUCOSE", "BUN", "CREATININE", "CALCIUM" in the last 72 hours. LFT No results for input(s): "PROT", "ALBUMIN", "AST", "ALT", "ALKPHOS", "BILITOT", "BILIDIR", "IBILI" in the last 72 hours. PT/INR No results for input(s): "LABPROT", "INR" in the last 72 hours.   Impression / Plan: This is a 66 y.o.female  who presents for EGD with possible EUS for attempt at duodenal adenoma resection, hopefully not involving the ampulla.   The risks of an EUS including intestinal perforation, bleeding, infection, aspiration, and medication effects were discussed as was the possibility it may not give a definitive diagnosis if a biopsy is performed.  When a biopsy of the pancreas is done as part of the EUS, there is an additional risk of pancreatitis at the rate of about 1-2%.  It was explained that procedure related pancreatitis is typically mild, although it can be severe and even life threatening, which is why we do not perform random pancreatic biopsies and only biopsy a lesion/area we feel is concerning enough to  warrant the risk.   The risks and benefits of endoscopic evaluation/treatment were discussed with the patient and/or family; these include but are not limited to the risk of perforation, infection, bleeding, missed lesions, lack of diagnosis, severe illness requiring hospitalization, as well as anesthesia and sedation related illnesses.  The patient's history has been reviewed, patient examined, no change in status, and deemed stable for procedure.  The patient and/or family is agreeable to proceed.    Justice Britain, MD [facility] Advanced Endoscopy Office # [phone]

## 2021-08-24 NOTE — Transfer of Care (Signed)
Immediate Anesthesia Transfer of Care Note  Patient: [name]  Procedure(s) Performed: ESOPHAGOGASTRODUODENOSCOPY (EGD) WITH PROPOFOL BIOPSY ENDOSCOPIC MUCOSAL RESECTION SUBMUCOSAL LIFTING INJECTION SUBMUCOSAL TATTOO INJECTION HOT HEMOSTASIS (ARGON PLASMA COAGULATION/BICAP) HEMOSTASIS CLIP PLACEMENT  Patient Location: PACU  Anesthesia Type:MAC  Level of Consciousness: awake, alert , oriented and patient cooperative  Airway & Oxygen Therapy: Patient Spontanous Breathing and Patient connected to face mask oxygen  Post-op Assessment: Report given to RN, Post -op Vital signs reviewed and stable and Patient moving all extremities X 4  Post vital signs: Reviewed and stable  Last Vitals:  Vitals Value Taken Time  BP    Temp    Pulse    Resp    SpO2      Last Pain:  Vitals:   08/24/21 0700  TempSrc: Temporal  PainSc: 0-No pain         Complications: No notable events documented.

## 2021-08-24 NOTE — Anesthesia Postprocedure Evaluation (Signed)
Anesthesia Post Note  Patient: [name]  Procedure(s) Performed: ESOPHAGOGASTRODUODENOSCOPY (EGD) WITH PROPOFOL BIOPSY ENDOSCOPIC MUCOSAL RESECTION SUBMUCOSAL LIFTING INJECTION SUBMUCOSAL TATTOO INJECTION HOT HEMOSTASIS (ARGON PLASMA COAGULATION/BICAP) HEMOSTASIS CLIP PLACEMENT     Patient location during evaluation: Endoscopy Anesthesia Type: MAC Level of consciousness: awake and alert Pain management: pain level controlled Vital Signs Assessment: post-procedure vital signs reviewed and stable Respiratory status: spontaneous breathing, nonlabored ventilation, respiratory function stable and patient connected to nasal cannula oxygen Cardiovascular status: stable and blood pressure returned to baseline Postop Assessment: no apparent nausea or vomiting Anesthetic complications: no   No notable events documented.  Last Vitals:  Vitals:   08/24/21 0950 08/24/21 1000  BP: (!) 141/87 120/67  Pulse: (!) 54 (!) 54  Resp: 18 11  Temp:    SpO2: 97% 100%    Last Pain:  Vitals:   08/24/21 1000  TempSrc:   PainSc: 0-No pain                 Belenda Cruise P Cephas Revard

## 2021-08-24 NOTE — Op Note (Signed)
[facility] Patient Name: [name] Procedure Date: 08/24/2021 MRN: [id number] Attending MD: Justice Britain , MD Date of Birth: 1955/03/27 CSN: [id number] Age: 66 Admit Type: Outpatient Procedure:                Small bowel enteroscopy Indications:              Adenomatous polyps in the duodenum, For therapy of                            adenomatous polyps in the duodenum Providers:                Justice Britain, MD, Carlyn Reichert, RN, William Dalton, Technician Referring MD:             Maylon Peppers, Noreene Larsson Medicines:                Monitored Anesthesia Care Complications:            No immediate complications. Estimated Blood Loss:     Estimated blood loss was minimal. Procedure:                Pre-Anesthesia Assessment:                           - Prior to the procedure, a History and Physical                            was performed, and patient medications and                            allergies were reviewed. The patient's tolerance of                            previous anesthesia was also reviewed. The risks                            and benefits of the procedure and the sedation                            options and risks were discussed with the patient.                            All questions were answered, and informed consent                            was obtained. Prior Anticoagulants: The patient has                            taken no previous anticoagulant or antiplatelet                            agents. ASA Grade Assessment: II - A patient with  mild systemic disease. After reviewing the risks                            and benefits, the patient was deemed in                            satisfactory condition to undergo the procedure.                           After obtaining informed consent, the endoscope was                            passed under direct vision.  Throughout the                            procedure, the patient's blood pressure, pulse, and                            oxygen saturations were monitored continuously. The                            [id number]-H190 ([id number]) Olympus endoscope was introduced                            through the mouth, and advanced to the second part                            of duodenum. The [id number]-[id number] ([id number]) Olympus                            duodenoscope was introduced through the mouth and                            advanced to the area of papilla. The PCF-HQ190L                            ([id number]) Olympus colonoscope was introduced                            through the mouth and advanced to the proximal                            jejunum. After obtaining informed consent, the                            endoscope was passed under direct vision.                            Throughout the procedure, the patient's blood                            pressure, pulse, and oxygen saturations were  monitored continuously.The small bowel enteroscopy                            was technically difficult and complex. Successful                            completion of the procedure was aided by performing                            the maneuvers documented (below) in this report. I                            needed to have the pediatric colonoscope and thus                            convert to an enteroscopy due to stability of the                            scope, location of the polyp and having to get some                            tissue after resection. The patient tolerated the                            procedure. Scope In: Scope Out: Findings:      No gross lesions were noted in the entire esophagus.      The Z-line was irregular and was found 38 cm from the incisors.      A non-obstructing Schatzki ring was found at the gastroesophageal       junction.      A 4 cm hiatal hernia  was present.      Patchy mildly erythematous mucosa without bleeding was found in the       entire examined stomach. Biopsies not performed as they had been done       previously.      Localized nodular mucosa was found in the duodenal bulb. Biopsies were       taken with a cold forceps for histology - query Bruenner's gland       hyperplasia more than adenomatous tissue but pathology important.      There was no evidence of significant pathology in the major papilla.      A single 45 mm semi-sessile polyp with no bleeding was found in the       second/third portion of the duodenum. It crossed over 4 folds and was       just distal to the ampulla. Preparations were made for attempt at       mucosal resection. NBI imaging and White-light endoscopy was done to       demarcate the borders of the lesion. Everlift was injected to raise the       lesion. Piecemeal mucosal resection using a snare was performed.       Resection and retrieval were felt to be complete. Fulguration to ablate       the lesion by argon plasma was successful to the margin to decrease risk       of recurrence. To prevent bleeding after  mucosal resection, five       hemostatic clips were successfully placed (MR conditional).       Unfortunately, the resection margin is too large to close completely.       There was no bleeding at the end of the procedure. Area was tattooed       with an injection of Spot (carbon black) just proximal to this area.      Normal mucosa was found in the entire duodenum otherwise.      Normal mucosa was found in the visualized proximal jejunum. Impression:               - No gross lesions in esophagus. Z-line irregular,                            38 cm from the incisors. Non-obstructing Schatzki                            ring.                           - 4 cm hiatal hernia.                           - Erythematous mucosa in the stomach - previously                            biopsied.                            - Nodular mucosa in the duodenal bulb. Biopsied to                            rule out adenomatous tissue.                           - Normal major papilla.                           - A single at least 45 mm duodenal polyp (just                            distal to ampulla) over 4 folds. Resected and                            retrieved with piecemeal mucosal resection. Treated                            with argon plasma coagulation (APC) to the margin.                            Clips (MR conditional) were placed - but complete                            closure was not possible due to size of resection.  Tattooed proximal.                           - Normal mucosa was found in the entire examined                            duodenum otherwise.                           - Normal mucosa was found in the examined proximal                            jejunum. Recommendation:           - The patient will be observed post-procedure,                            until all discharge criteria are met.                           - Discharge patient to home.                           - Patient has a contact number available for                            emergencies. The signs and symptoms of potential                            delayed complications were discussed with the                            patient. Return to normal activities tomorrow.                            Written discharge instructions were provided to the                            patient.                           - CLD for next 6 hours and then may go to full                            liquid diet for rest of today.                           - If patient is doing well then can transition to                            soft diet tomorrow.                           - Increase to Protonix 40 mg twice daily for next  76-month. Then may go back to once daily.                            - Start Carafate 1 g twice daily (liquid Rx more                            expensive so given oral tablet - which can still be                            dissolved) for 1 month.                           - No aspirin, ibuprofen, naproxen, or other                            non-steroidal anti-inflammatory drugs for 2 weeks                            after polyp removal.                           - Continue present medications.                           - Please use Cepacol or Halls Lozenges +/-                            Chloraseptic spray for next 72-96 hours to aid in                            sore thoat should you experience this.                           - Repeat the small bowel enteroscopy in 9 months                            for surveillance.                           - The findings and recommendations were discussed                            with the patient.                           - The findings and recommendations were discussed                            with the designated responsible adult. Procedure Code(s):        --- Professional ---                           [id number] Small intestinal endoscopy, enteroscopy  beyond second portion of duodenum, not including                            ileum; with removal of tumor(s), polyp(s), or other                            lesion(s) by snare technique Diagnosis Code(s):        --- Professional ---                           K22.8, Other specified diseases of esophagus                           K22.2, Esophageal obstruction                           K44.9, Diaphragmatic hernia without obstruction or                            gangrene                           K31.89, Other diseases of stomach and duodenum                           K31.7, Polyp of stomach and duodenum                           D13.2, Benign neoplasm of duodenum CPT copyright 2019 [employer]. All  rights reserved. The codes documented in this report are preliminary and upon coder review may  be revised to meet current compliance requirements. Justice Britain, MD 08/24/2021 9:52:21 AM Number of Addenda: 0

## 2021-08-25 ENCOUNTER — Encounter (HOSPITAL_COMMUNITY): Payer: Self-pay | Admitting: Gastroenterology

## 2021-08-25 ENCOUNTER — Encounter: Payer: Self-pay | Admitting: Gastroenterology

## 2021-08-25 LAB — SURGICAL PATHOLOGY

## 2021-08-26 ENCOUNTER — Encounter (INDEPENDENT_AMBULATORY_CARE_PROVIDER_SITE_OTHER): Payer: Self-pay | Admitting: *Deleted

## 2021-08-31 ENCOUNTER — Ambulatory Visit (INDEPENDENT_AMBULATORY_CARE_PROVIDER_SITE_OTHER): Payer: 59 | Admitting: Gastroenterology

## 2021-08-31 ENCOUNTER — Encounter (INDEPENDENT_AMBULATORY_CARE_PROVIDER_SITE_OTHER): Payer: Self-pay | Admitting: Gastroenterology

## 2021-08-31 VITALS — BP 122/82 | HR 68 | Temp 97.8°F | Ht 67.0 in | Wt 160.9 lb

## 2021-08-31 DIAGNOSIS — K317 Polyp of stomach and duodenum: Secondary | ICD-10-CM | POA: Diagnosis not present

## 2021-08-31 DIAGNOSIS — K589 Irritable bowel syndrome without diarrhea: Secondary | ICD-10-CM

## 2021-09-03 ENCOUNTER — Ambulatory Visit (INDEPENDENT_AMBULATORY_CARE_PROVIDER_SITE_OTHER): Payer: 59 | Admitting: Gastroenterology

## 2021-09-18 ENCOUNTER — Other Ambulatory Visit: Payer: Self-pay | Admitting: Physician Assistant

## 2021-09-18 NOTE — Telephone Encounter (Signed)
Patient called in for refill for Alprazolam 0.'5mg'$ . States she is down to her last pill and she needs refill today. " It would not be good to go without it for the weekend. Please refill ASAP. Ph:[phone] Appt 7/17 Pharmacy [facility] [place], [place]

## 2021-09-21 ENCOUNTER — Encounter: Payer: Self-pay | Admitting: Physician Assistant

## 2021-09-21 ENCOUNTER — Ambulatory Visit (INDEPENDENT_AMBULATORY_CARE_PROVIDER_SITE_OTHER): Payer: 59 | Admitting: Physician Assistant

## 2021-09-21 DIAGNOSIS — F3175 Bipolar disorder, in partial remission, most recent episode depressed: Secondary | ICD-10-CM

## 2021-09-21 DIAGNOSIS — F4310 Post-traumatic stress disorder, unspecified: Secondary | ICD-10-CM

## 2021-09-21 DIAGNOSIS — F411 Generalized anxiety disorder: Secondary | ICD-10-CM

## 2021-09-21 DIAGNOSIS — Z87891 Personal history of nicotine dependence: Secondary | ICD-10-CM

## 2021-09-21 DIAGNOSIS — G4700 Insomnia, unspecified: Secondary | ICD-10-CM

## 2021-09-21 MED ORDER — LAMOTRIGINE 200 MG PO TABS
200.0000 mg | ORAL_TABLET | Freq: Two times a day (BID) | ORAL | 5 refills | Status: DC
Start: 2021-09-21 — End: 2021-10-22

## 2021-09-21 MED ORDER — ALPRAZOLAM 0.5 MG PO TABS
ORAL_TABLET | ORAL | 5 refills | Status: DC
Start: 2021-09-21 — End: 2022-03-23

## 2021-09-21 MED ORDER — TRAZODONE HCL 100 MG PO TABS: 100.0000 mg | ORAL_TABLET | Freq: Every evening | ORAL | 3 refills | Status: DC | PRN

## 2021-09-21 NOTE — Progress Notes (Signed)
[facility] Med Check  Patient ID: [name],  MRN: [id number]  PCP: Noreene Larsson, NP  Date of Evaluation: 09/21/2021 Time spent:20 minutes  Chief Complaint:  Chief Complaint   Anxiety; Depression; Insomnia; Follow-up    HISTORY/CURRENT STATUS: HPI  For routine med check.  She is doing really well.  Has a new boyfriend. Patient denies loss of interest in usual activities and is able to enjoy things.  Denies decreased energy.  Denies decreased motivation.  ADLs and personal hygiene are normal.  Appetite has not changed.  Weight is stable.  No extreme sadness, tearfulness, or feelings of hopelessness.  Sleeps well most of the time.  Denies any changes in concentration, making decisions or remembering things.  Denies suicidal or homicidal thoughts.  She does get anxious, needs the Xanax almost daily.  If she does not take it she gets really panicky.  The BuSpar has helped decrease the severity of the anxiety.  Patient denies increased energy with decreased need for sleep, no increased talkativeness, no racing thoughts, no impulsivity or risky behaviors, no increased spending, no increased libido, no grandiosity, no increased irritability or anger, no paranoia, and no hallucinations.  Denies dizziness, syncope, seizures, numbness, tremor, tingling, tics, unsteady gait, slurred speech, confusion. Denies muscle or joint pain, stiffness, or dystonia.  Individual Medical History/ Review of Systems: Changes? :Yes   had a polyp removed from duodenum a few weeks ago.  Past medications for mental health diagnoses include: Effexor, Ativan, Paxil, Lithobid, Xanax, Lamictal, trazodone, Saphris, Zoloft, Seroquel, BuSpar Wellbutrin caused tremor  Allergies: Meperidine and 5-alpha reductase inhibitors  Current Medications:  Current Outpatient Medications:    acetaminophen (TYLENOL) 500 MG tablet, Take 500 mg by mouth every 6 (six) hours as needed for mild pain or moderate pain., Disp: ,  Rfl:    busPIRone (BUSPAR) 15 MG tablet, Take 1 tablet (15 mg total) by mouth 2 (two) times daily., Disp: 180 tablet, Rfl: 3   dicyclomine (BENTYL) 20 MG tablet, Take 1 tablet (20 mg total) by mouth 3 (three) times daily as needed (abdominal pain)., Disp: 30 tablet, Rfl: 1   pantoprazole (PROTONIX) 40 MG tablet, Take 1 tablet (40 mg total) by mouth 2 (two) times daily before a meal., Disp: 60 tablet, Rfl: 12   Probiotic Product (PROBIOTIC DAILY PO), Take 1 capsule by mouth daily. 1 million organism, Disp: , Rfl:    sucralfate (CARAFATE) 1 g tablet, Take 1 tablet (1 g total) by mouth 2 (two) times daily. Please give instructions on how to dissolve the Carafate (otherwise she can reach out to my office [phone] and my RN will help), Disp: 60 tablet, Rfl: 0   albuterol (VENTOLIN HFA) 108 (90 Base) MCG/ACT inhaler, INHALE 2 PUFFS INTO THE LUNGS EVERY 6 HOURS AS NEEDED FOR WHEEZING OR SHORTNESS OF BREATH (Patient not taking: Reported on 09/21/2021), Disp: 6.7 g, Rfl: 0   ALPRAZolam (XANAX) 0.5 MG tablet, TAKE 1 TABLET(0.5 MG) BY MOUTH THREE TIMES DAILY AS NEEDED FOR ANXIETY, Disp: 90 tablet, Rfl: 5   cetirizine (ZYRTEC ALLERGY) 10 MG tablet, Take 1 tablet (10 mg total) by mouth daily. (Patient not taking: Reported on 08/17/2021), Disp: 30 tablet, Rfl: 0   lamoTRIgine (LAMICTAL) 200 MG tablet, Take 1 tablet (200 mg total) by mouth 2 (two) times daily., Disp: 60 tablet, Rfl: 5   traZODone (DESYREL) 100 MG tablet, Take 1-2 tablets (100-200 mg total) by mouth at bedtime as needed for sleep., Disp: 180 tablet, Rfl: 3  TURMERIC PO, Take 500 mg by mouth in the morning. (Patient not taking: Reported on 08/31/2021), Disp: , Rfl:  Medication Side Effects: none  Family Medical/ Social History: Changes? No  MENTAL HEALTH EXAM:  There were no vitals taken for this visit.There is no height or weight on file to calculate BMI.  General Appearance: Casual, Neat and Well Groomed  Eye Contact:  Good  Speech:  Clear and  Coherent and Normal Rate  Volume:  Normal  Mood:  Euthymic  Affect:  Appropriate  Thought Process:  Goal Directed and Descriptions of Associations: Circumstantial  Orientation:  Full (Time, Place, and Person)  Thought Content: Logical   Suicidal Thoughts:  No  Homicidal Thoughts:  No  Memory:  WNL  Judgement:  Good  Insight:  Good  Psychomotor Activity:  Normal  Concentration:  Concentration: Good and Attention Span: Fair  Recall:  Good  Fund of Knowledge: Good  Language: Good  Assets:  Desire for Improvement  ADL's:  Intact  Cognition: WNL  Prognosis:  Good   DIAGNOSES:    ICD-10-CM   1. Depressed bipolar I disorder in partial remission (HCC)  F31.75     2. Former smoker  Z87.891     3. PTSD (post-traumatic stress disorder)  F43.10     4. Generalized anxiety disorder  F41.1     5. Insomnia, unspecified type  G47.00       Receiving Psychotherapy: No    RECOMMENDATIONS:  PDMP was reviewed.  Last Xanax filled 09/18/2021. I provided 20 minutes of face to face time during this encounter, including time spent before and after the visit in records review, medical decision making, counseling pertinent to today's visit, and charting.   She is doing really well so no changes will be made. Congratulations on smoking cessation.  Continue Xanax 0.5 mg, 1 p.o. 3 times daily as needed anxiety. Continue BuSpar 15 mg, 1 p.o. twice daily. Continue Lamictal 100 mg, 2 p.o. twice daily. Continue trazodone 100 mg, 1-2 nightly as needed sleep. Return in 6 months.  Donnal Moat, PA-C

## 2021-10-13 ENCOUNTER — Ambulatory Visit: Payer: 59 | Admitting: Family Medicine

## 2021-10-21 ENCOUNTER — Other Ambulatory Visit: Payer: Self-pay | Admitting: Physician Assistant

## 2021-12-08 ENCOUNTER — Ambulatory Visit: Payer: 59 | Admitting: Gastroenterology

## 2021-12-08 ENCOUNTER — Encounter: Payer: Self-pay | Admitting: Gastroenterology

## 2021-12-08 ENCOUNTER — Ambulatory Visit (INDEPENDENT_AMBULATORY_CARE_PROVIDER_SITE_OTHER): Payer: Self-pay | Admitting: Gastroenterology

## 2021-12-08 VITALS — BP 107/71 | HR 88 | Temp 97.2°F | Ht 67.0 in | Wt 155.8 lb

## 2021-12-08 DIAGNOSIS — R140 Abdominal distension (gaseous): Secondary | ICD-10-CM

## 2021-12-08 DIAGNOSIS — R1032 Left lower quadrant pain: Secondary | ICD-10-CM

## 2021-12-08 MED ORDER — RIFAXIMIN 550 MG PO TABS: 550.0000 mg | ORAL_TABLET | Freq: Three times a day (TID) | ORAL | 0 refills | Status: AC

## 2021-12-08 NOTE — Patient Instructions (Signed)
Please complete labs at [facility] when you are able.  I have sent in Xifaxan to take 3 times a day for 2 weeks. Please message me with how you do with this.  We will see you in 3 months!  It was a pleasure to see you today. I want to create trusting relationships with patients to provide genuine, compassionate, and quality care. I value your feedback. If you receive a survey regarding your visit,  I greatly appreciate you taking time to fill this out.   Annitta Needs, PhD, ANP-BC [facility]

## 2021-12-08 NOTE — Progress Notes (Unsigned)
Gastroenterology Office Note     Primary Care Physician:  Noreene Larsson, NP  Primary Gastroenterologist: Dr. Abbey Chatters    Chief Complaint   Chief Complaint  Patient presents with   Follow-up    Pt here for bloating and pain lower left side. Pt states she feels better today     History of Present Illness   [name] is a 66 y.o. female presenting today in follow-up with a history of bipolar disorder, anxiety, depression, insomnia, hyperlipidemia, IBS and duodenal tubulovillous adenoma, who presents for follow up of IBS.  Some days she feels well. Some days has LLQ pain, bloating. Will take dicyclomine as needed. Notes early satiety. Occasional diarrhea but not excessive.  Normal BMs. Will be resuming a probiotic soon. No nausea. Sunday had double cheeseburger from [employer]. Woke up Sunday with pain. No pain currently. No overt GI bleeding.   EMR of duodenal polyp June 2023 via piecemeal fashion. Path positive for tubulovillous adenoma. Repeat EGD for surveillance in 9-12 months.   Mach 2023: large ulcer at IC valve with negative biopsies for dysplasia. two tubular adenomas. Recommending surveillance in 6 months to assess ulcer healing. Patient prefers to undergo this at time of EGD for history of duodenal adenoma.     Past Medical History:  Diagnosis Date   Anxiety    Anxiety state 01/01/2018   Bipolar 1 disorder ([place])    Bipolar disorder ([place]) 12/12/2013   Depression    Diverticulitis    Diverticulitis of colon 03/15/2016   Hyperlipidemia 06/12/2020   Insomnia 01/01/2018   Tobacco abuse 06/25/2016   Tubular adenoma 10/02/2018    Past Surgical History:  Procedure Laterality Date   BIOPSY  05/15/2021   Procedure: BIOPSY;  Surgeon: Harvel Quale, MD;  [facility];  Service: Gastroenterology;;   BIOPSY  08/24/2021   Procedure: BIOPSY;  Surgeon: Irving Copas., MD;  Location: [facility];  Service: Gastroenterology;;    COLONOSCOPY     COLONOSCOPY N/A 08/08/2019   Procedure: COLONOSCOPY;  Surgeon: Rogene Houston, MD;  [facility];  Service: Endoscopy;  Laterality: N/A;  220   COLONOSCOPY WITH PROPOFOL N/A 05/15/2021   large ulcer at IC valve with negative biopsies for dysplasia. two tubular adenomas. Recommending surveillance in 6 months to assess ulcer healing. Patient prefers to undergo this at time of EGD for history of duodenal adenoma.   ENDOSCOPIC MUCOSAL RESECTION  08/24/2021   Dr. Rush Landmark: duodenal polyp s/p piecemeal removal, path tubulovillous adenoma. Repeat in 9-12 months.   ESOPHAGOGASTRODUODENOSCOPY (EGD) WITH PROPOFOL N/A 05/15/2021   negative H.pylori, duodenal adenoma. Referred for EMR.   ESOPHAGOGASTRODUODENOSCOPY (EGD) WITH PROPOFOL N/A 08/24/2021   Procedure: ESOPHAGOGASTRODUODENOSCOPY (EGD) WITH PROPOFOL;  Surgeon: Rush Landmark Telford Nab., MD;  Location: [facility];  Service: Gastroenterology;  Laterality: N/A;   HEMOSTASIS CLIP PLACEMENT  08/24/2021   Procedure: HEMOSTASIS CLIP PLACEMENT;  Surgeon: Irving Copas., MD;  Location: [facility];  Service: Gastroenterology;;   HOT HEMOSTASIS N/A 08/24/2021   Procedure: HOT HEMOSTASIS (ARGON PLASMA COAGULATION/BICAP);  Surgeon: Irving Copas., MD;  Location: [facility];  Service: Gastroenterology;  Laterality: N/A;   oophrorectomy     POLYPECTOMY  08/08/2019   Procedure: POLYPECTOMY;  Surgeon: Rogene Houston, MD;  [facility];  Service: Endoscopy;;   POLYPECTOMY  05/15/2021   Procedure: POLYPECTOMY;  Surgeon: Harvel Quale, MD;  [facility];  Service: Gastroenterology;;  SUBMUCOSAL LIFTING INJECTION  08/24/2021   Procedure: SUBMUCOSAL LIFTING INJECTION;  Surgeon: Rush Landmark Telford Nab., MD;  Location: [facility];  Service: Gastroenterology;;   SUBMUCOSAL TATTOO INJECTION  08/24/2021   Procedure: SUBMUCOSAL TATTOO INJECTION;  Surgeon: Irving Copas., MD;   [facility];  Service: Gastroenterology;;   TUBAL LIGATION      Current Outpatient Medications  Medication Sig Dispense Refill   acetaminophen (TYLENOL) 500 MG tablet Take 500 mg by mouth every 6 (six) hours as needed for mild pain or moderate pain.     ALPRAZolam (XANAX) 0.5 MG tablet TAKE 1 TABLET(0.5 MG) BY MOUTH THREE TIMES DAILY AS NEEDED FOR ANXIETY 90 tablet 5   busPIRone (BUSPAR) 15 MG tablet Take 1 tablet (15 mg total) by mouth 2 (two) times daily. 180 tablet 3   dicyclomine (BENTYL) 20 MG tablet Take 1 tablet (20 mg total) by mouth 3 (three) times daily as needed (abdominal pain). 30 tablet 1   lamoTRIgine (LAMICTAL) 200 MG tablet TAKE 1 TABLET(200 MG) BY MOUTH TWICE DAILY 60 tablet 5   pantoprazole (PROTONIX) 40 MG tablet Take 1 tablet (40 mg total) by mouth 2 (two) times daily before a meal. 60 tablet 12   rifaximin (XIFAXAN) 550 MG TABS tablet Take 1 tablet (550 mg total) by mouth 3 (three) times daily for 14 days. 42 tablet 0   traZODone (DESYREL) 100 MG tablet Take 1-2 tablets (100-200 mg total) by mouth at bedtime as needed for sleep. 180 tablet 3   albuterol (VENTOLIN HFA) 108 (90 Base) MCG/ACT inhaler INHALE 2 PUFFS INTO THE LUNGS EVERY 6 HOURS AS NEEDED FOR WHEEZING OR SHORTNESS OF BREATH (Patient not taking: Reported on 09/21/2021) 6.7 g 0   cetirizine (ZYRTEC ALLERGY) 10 MG tablet Take 1 tablet (10 mg total) by mouth daily. (Patient not taking: Reported on 08/17/2021) 30 tablet 0   No current facility-administered medications for this visit.    Allergies as of 12/08/2021 - Review Complete 12/08/2021  Allergen Reaction Noted   Meperidine Hives 10/13/2017   5-alpha reductase inhibitors Other (See Comments) 08/08/2019    Family History  Problem Relation Age of Onset   Cancer Mother        lichen planus - oral cancer   Cancer Father        lung   Cancer Maternal Uncle        uterine   Diabetes Maternal Uncle    Stroke Maternal Grandmother    COPD Paternal  Grandfather    Colon cancer Neg Hx    Esophageal cancer Neg Hx    Stomach cancer Neg Hx    Liver cancer Neg Hx    Pancreatic cancer Neg Hx     Social History   Socioeconomic History   Marital status: Divorced    Spouse name: Not on file   Number of children: 2   Years of education: 13   Highest education level: Not on file  Occupational History   Occupation: Psychologist, 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[employer], [place]  Tobacco Use   Smoking status: Former    Types: Cigarettes    Quit date: 08/19/2021    Years since quitting: 0.3   Smokeless tobacco: Never  Vaping Use   Vaping Use: Never used  Substance and Sexual Activity   Alcohol use: Yes    Alcohol/week: 2.0 standard drinks of alcohol    Types: 2 Glasses of wine per week   Drug use: No   Sexual activity: Not  Currently    Birth control/protection: Post-menopausal  Other Topics Concern   Not on file  Social History Narrative   Lives alone   She has 2 sons that are close by.      She enjoys going out to dinner and spend time with family.      Diet reports eating all foods.   Caffeine none   Water 6 to 8 cups regularly daily.      Social Determinants of Health   Financial Resource Strain: Not on file  Food Insecurity: Not on file  Transportation Needs: Not on file  Physical Activity: Not on file  Stress: Not on file  Social Connections: Not on file  Intimate Partner Violence: Not on file     Review of Systems   Gen: Denies any fever, chills, fatigue, weight loss, lack of appetite.  CV: Denies chest pain, heart palpitations, peripheral edema, syncope.  Resp: Denies shortness of breath at rest or with exertion. Denies wheezing or cough.  GI: Denies dysphagia or odynophagia. Denies jaundice, hematemesis, fecal incontinence. GU : Denies urinary burning, urinary frequency, urinary hesitancy MS: Denies joint pain, muscle weakness, cramps, or limitation of movement.  Derm: Denies rash, itching, dry skin Psych:  Denies depression, anxiety, memory loss, and confusion Heme: Denies bruising, bleeding, and enlarged lymph nodes.   Physical Exam   BP 107/71   [name] 88   Temp (!) 97.2 F (36.2 C)   Ht '5\' 7"'$  (1.702 m)   Wt 155 lb 12.8 oz (70.7 kg)   BMI 24.40 kg/m  General:   Alert and oriented. Pleasant and cooperative. Well-nourished and well-developed.  Head:  Normocephalic and atraumatic. Eyes:  Without icterus Abdomen:  +BS, soft, non-tender and non-distended. No HSM noted. No guarding or rebound. No masses appreciated.  Rectal:  Deferred  Msk:  Symmetrical without gross deformities. Normal posture. Extremities:  Without edema. Neurologic:  Alert and  oriented x4;  grossly normal neurologically. Skin:  Intact without significant lesions or rashes. Psych:  Alert and cooperative. Normal mood and affect.   Assessment   [name] is a 66 y.o. female presenting today in follow-up with a history of bipolar disorder, anxiety, depression, insomnia, hyperlipidemia, IBS and duodenal tubulovillous adenoma, who presents for follow up of IBS.  IBS: with intermittent diarrhea. Associated LLQ abdominal pain chronic. CT on file Feb 2023 due to symptoms without diverticulitis. Colonoscopy on file from March 2023 with large ulcer at ICV and tubular adenomas. We will trial round of Xifaxan.   History of duodenal tubulovillous adenoma: Needs repeat EGD in 9-12 months (latest June 2024). Desires early interval colonoscopy at that time.     PLAN    Xifaxan 550 mg TID X 14 days Check celiac serologies  EGD for surveillance of duodenal adenoma at latest June 2024. Colonoscopy at that time as well.  3 month follow-up    Annitta Needs, PhD, ANP-BC [facility] Gastroenterology

## 2021-12-15 ENCOUNTER — Ambulatory Visit: Payer: Self-pay | Admitting: Gastroenterology

## 2021-12-21 DIAGNOSIS — M9903 Segmental and somatic dysfunction of lumbar region: Secondary | ICD-10-CM | POA: Diagnosis not present

## 2021-12-21 DIAGNOSIS — M9905 Segmental and somatic dysfunction of pelvic region: Secondary | ICD-10-CM | POA: Diagnosis not present

## 2021-12-21 DIAGNOSIS — M9902 Segmental and somatic dysfunction of thoracic region: Secondary | ICD-10-CM | POA: Diagnosis not present

## 2021-12-21 DIAGNOSIS — M5441 Lumbago with sciatica, right side: Secondary | ICD-10-CM | POA: Diagnosis not present

## 2022-01-01 DIAGNOSIS — H2513 Age-related nuclear cataract, bilateral: Secondary | ICD-10-CM | POA: Diagnosis not present

## 2022-02-08 DIAGNOSIS — M9905 Segmental and somatic dysfunction of pelvic region: Secondary | ICD-10-CM | POA: Diagnosis not present

## 2022-02-08 DIAGNOSIS — M546 Pain in thoracic spine: Secondary | ICD-10-CM | POA: Diagnosis not present

## 2022-02-08 DIAGNOSIS — M9902 Segmental and somatic dysfunction of thoracic region: Secondary | ICD-10-CM | POA: Diagnosis not present

## 2022-02-08 DIAGNOSIS — M9903 Segmental and somatic dysfunction of lumbar region: Secondary | ICD-10-CM | POA: Diagnosis not present

## 2022-02-08 DIAGNOSIS — M5441 Lumbago with sciatica, right side: Secondary | ICD-10-CM | POA: Diagnosis not present

## 2022-02-09 ENCOUNTER — Ambulatory Visit: Payer: Medicare Other | Admitting: Internal Medicine

## 2022-02-09 DIAGNOSIS — Z780 Asymptomatic menopausal state: Secondary | ICD-10-CM | POA: Diagnosis not present

## 2022-02-09 DIAGNOSIS — N6489 Other specified disorders of breast: Secondary | ICD-10-CM | POA: Diagnosis not present

## 2022-02-09 DIAGNOSIS — Z13820 Encounter for screening for osteoporosis: Secondary | ICD-10-CM | POA: Diagnosis not present

## 2022-02-09 DIAGNOSIS — N319 Neuromuscular dysfunction of bladder, unspecified: Secondary | ICD-10-CM | POA: Diagnosis not present

## 2022-02-10 ENCOUNTER — Encounter: Payer: Self-pay | Admitting: Internal Medicine

## 2022-02-10 ENCOUNTER — Ambulatory Visit (INDEPENDENT_AMBULATORY_CARE_PROVIDER_SITE_OTHER): Payer: Medicare Other | Admitting: Internal Medicine

## 2022-02-10 VITALS — BP 103/70 | HR 77 | Resp 16 | Ht 67.0 in | Wt 159.0 lb

## 2022-02-10 DIAGNOSIS — M7702 Medial epicondylitis, left elbow: Secondary | ICD-10-CM

## 2022-02-10 DIAGNOSIS — M7701 Medial epicondylitis, right elbow: Secondary | ICD-10-CM | POA: Diagnosis not present

## 2022-02-10 MED ORDER — DICLOFENAC SODIUM 1 % EX GEL: 4.0000 g | Freq: Four times a day (QID) | CUTANEOUS | 0 refills | Status: DC

## 2022-02-10 NOTE — Assessment & Plan Note (Signed)
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 In the last month she has noticed pain in left elbow. Some pain in left shoulder and pain in her hands. No history of these pains before. Pain in hands improve with movement. No fever, weight loss, night sweats, or trauma.

## 2022-02-10 NOTE — Progress Notes (Unsigned)
     CC: Joint Pain (When she wakes up in the am her joints are sore and stiff. Has pain in her left shoulder when she moves it a certain way, also pain in her feet x 1 month )    HPI:[name] is a 66 y.o. female who presents for evaluation of joint pain. For the details of today's visit, please refer to the assessment and plan.   Past Medical History:  Diagnosis Date   Anxiety    Anxiety state 01/01/2018   Bipolar 1 disorder ([place])    Bipolar disorder ([place]) 12/12/2013   Depression    Diverticulitis    Diverticulitis of colon 03/15/2016   Hyperlipidemia 06/12/2020   Insomnia 01/01/2018   Tobacco abuse 06/25/2016   Tubular adenoma 10/02/2018     Physical Exam: Vitals:   02/10/22 1448  BP: 103/70  Pulse: 77  Resp: 16  SpO2: 98%  Weight: 159 lb (72.1 kg)  Height: '5\' 7"'$  (1.702 m)     Physical Exam Constitutional:      Appearance: She is well-developed and well-groomed.  Cardiovascular:     Rate and Rhythm: Normal rate and regular rhythm.  Pulmonary:     Effort: Pulmonary effort is normal. No respiratory distress.     Breath sounds: No wheezing.  Musculoskeletal:     Right elbow: Normal.     Left elbow: No swelling, deformity or effusion. Normal range of motion. Tenderness present in medial epicondyle.      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 In the last month she has noticed pain in left elbow. Some pain in left shoulder and pain in her hands. No history of these pains before. Pain in hands improve with movement. No fever, weight loss, night sweats, or trauma. History of GI inflammation, needs to avoid po NSAIDs.   Assessment/Plan: Medial epicondylitis of elbow, left - diclofenac Sodium (VOLTAREN) 1 % GEL; Apply 4 g topically 4 (four) times daily.  Dispense: 150 g; Refill: 0 - lifting restriction of 10 lbs until improved - Follow up if needed     Lorene Dy,  MD

## 2022-02-10 NOTE — Patient Instructions (Addendum)
Thank you for trusting me with your care. To recap, today we discussed the following:  Medial epicondylitis of elbow, left - diclofenac Sodium (VOLTAREN) 1 % GEL; Apply 4 g topically 4 (four) times daily.  Dispense: 150 g; Refill: 0 - Tylenol as needed for pain, max 1000 mg x 4 times a day  - You should avoid heaving lifting which may aggravate this injury

## 2022-02-15 DIAGNOSIS — M9902 Segmental and somatic dysfunction of thoracic region: Secondary | ICD-10-CM | POA: Diagnosis not present

## 2022-02-15 DIAGNOSIS — M546 Pain in thoracic spine: Secondary | ICD-10-CM | POA: Diagnosis not present

## 2022-02-15 DIAGNOSIS — M5441 Lumbago with sciatica, right side: Secondary | ICD-10-CM | POA: Diagnosis not present

## 2022-02-15 DIAGNOSIS — M9905 Segmental and somatic dysfunction of pelvic region: Secondary | ICD-10-CM | POA: Diagnosis not present

## 2022-02-15 DIAGNOSIS — M9903 Segmental and somatic dysfunction of lumbar region: Secondary | ICD-10-CM | POA: Diagnosis not present

## 2022-02-17 ENCOUNTER — Other Ambulatory Visit: Payer: Self-pay

## 2022-02-18 ENCOUNTER — Other Ambulatory Visit: Payer: Self-pay

## 2022-02-18 ENCOUNTER — Encounter: Payer: Self-pay | Admitting: Internal Medicine

## 2022-02-18 ENCOUNTER — Encounter (HOSPITAL_COMMUNITY): Payer: Self-pay | Admitting: Emergency Medicine

## 2022-02-18 ENCOUNTER — Emergency Department (HOSPITAL_COMMUNITY)
Admission: EM | Admit: 2022-02-18 | Discharge: 2022-02-18 | Discharge status: Against Medical Advice | Payer: Medicare Other | Attending: Emergency Medicine | Admitting: Emergency Medicine

## 2022-02-18 DIAGNOSIS — F410 Panic disorder [episodic paroxysmal anxiety] without agoraphobia: Secondary | ICD-10-CM | POA: Diagnosis present

## 2022-02-18 DIAGNOSIS — Z5321 Procedure and treatment not carried out due to patient leaving prior to being seen by health care provider: Secondary | ICD-10-CM | POA: Insufficient documentation

## 2022-02-18 NOTE — ED Notes (Signed)
Pt asking when she is going to get a room. Pt upset and states she is at more risk getting the flu or covid. Rechecked vs. Pt stated she just wanted to leave and see her dr tomorrow even after risks given.

## 2022-02-18 NOTE — ED Triage Notes (Addendum)
Pt states she feels like her heart is beating fast since 2 hours, states she feels like it comes and goes, reports she was getting ready for work when it started, denies over-exertion, pt reports she has been under increased stress over the past 2 weeks, reports she has a hx of panic attacks and this feels the same but has been years since she had one

## 2022-02-19 ENCOUNTER — Other Ambulatory Visit: Payer: Self-pay | Admitting: Physician Assistant

## 2022-02-19 NOTE — Telephone Encounter (Signed)
Pt LVM @ 2:49p.  She called to follow up the request from the pharmacy for the Buspirone.  Next appt 1/16

## 2022-02-23 ENCOUNTER — Encounter: Payer: Medicare Other | Admitting: Internal Medicine

## 2022-03-09 DIAGNOSIS — H43813 Vitreous degeneration, bilateral: Secondary | ICD-10-CM | POA: Diagnosis not present

## 2022-03-09 DIAGNOSIS — H2511 Age-related nuclear cataract, right eye: Secondary | ICD-10-CM | POA: Diagnosis not present

## 2022-03-09 DIAGNOSIS — H18413 Arcus senilis, bilateral: Secondary | ICD-10-CM | POA: Diagnosis not present

## 2022-03-09 DIAGNOSIS — H25013 Cortical age-related cataract, bilateral: Secondary | ICD-10-CM | POA: Diagnosis not present

## 2022-03-09 DIAGNOSIS — H2513 Age-related nuclear cataract, bilateral: Secondary | ICD-10-CM | POA: Diagnosis not present

## 2022-03-16 ENCOUNTER — Ambulatory Visit (INDEPENDENT_AMBULATORY_CARE_PROVIDER_SITE_OTHER): Payer: Medicare Other | Admitting: Internal Medicine

## 2022-03-16 ENCOUNTER — Encounter: Payer: Self-pay | Admitting: Internal Medicine

## 2022-03-16 VITALS — BP 98/65 | HR 78 | Resp 16 | Ht 67.0 in | Wt 156.1 lb

## 2022-03-16 DIAGNOSIS — J988 Other specified respiratory disorders: Secondary | ICD-10-CM

## 2022-03-16 DIAGNOSIS — Z0000 Encounter for general adult medical examination without abnormal findings: Secondary | ICD-10-CM | POA: Diagnosis not present

## 2022-03-16 MED ORDER — IPRATROPIUM BROMIDE 0.03 % NA SOLN: 2.0000 | Freq: Two times a day (BID) | NASAL | 0 refills | Status: DC | PRN

## 2022-03-16 NOTE — Patient Instructions (Addendum)
  [name] , Thank you for taking time to come for your Medicare Wellness Visit. I appreciate your ongoing commitment to your health goals. Please review the following plan we discussed and let me know if I can assist you in the future.   These are the goals we discussed: Patient is going to continue working on portion control. She has a target weight of 145.   I recommend Nasal Rinse, called Neti Pot. This can be found at pharmacy or [facility].   This is a list of the screening recommended for you and due dates:  Health Maintenance  Topic Date Due   Medicare Annual Wellness Visit  Never done   Pneumonia Vaccine (1 - PCV) Never done   DTaP/Tdap/Td vaccine (1 - Tdap) Never done   Pap Smear  Never done   Mammogram  Never done   Zoster (Shingles) Vaccine (1 of 2) Never done   COVID-19 Vaccine (2 - 2023-24 season) 11/06/2021   DEXA scan (bone density measurement)  12/19/2021   Flu Shot  06/06/2022*   Colon Cancer Screening  05/16/2026   Hepatitis C Screening: USPSTF Recommendation to screen - Ages 18-79 yo.  Completed   HIV Screening  Completed   HPV Vaccine  Aged Out  *Topic was postponed. The date shown is not the original due date.

## 2022-03-16 NOTE — Progress Notes (Signed)
Subjective:    [name] is a 67 y.o. female who presents for a Welcome to Medicare exam.   In addition to AWV, patient is having congestion and cough today. She started having this a week ago after using a sander. She is experiencing post nasal drip and the cough is worst in the morning. No fever, chill, shortness of breath.   Review of Systems Review of Systems  Constitutional:  Negative for chills and fever.  HENT:  Positive for congestion. Negative for sinus pain and sore throat.   Respiratory:  Positive for cough.      Objective:    Today's Vitals   03/16/22 1433 03/16/22 1451  BP: 98/65   Pulse: 78   Resp: 16   SpO2: 96%   Weight: 156 lb 1.9 oz (70.8 kg)   Height: '5\' 7"'$  (1.702 m)   PainSc: 0-No pain 0-No pain  Body mass index is 24.45 kg/m.  Medications Outpatient Encounter Medications as of 03/16/2022  Medication Sig   acetaminophen (TYLENOL) 500 MG tablet Take 500 mg by mouth every 6 (six) hours as needed for mild pain or moderate pain.   ALPRAZolam (XANAX) 0.5 MG tablet TAKE 1 TABLET(0.5 MG) BY MOUTH THREE TIMES DAILY AS NEEDED FOR ANXIETY   busPIRone (BUSPAR) 15 MG tablet TAKE 1 TABLET(15 MG) BY MOUTH TWICE DAILY   diclofenac Sodium (VOLTAREN) 1 % GEL Apply 4 g topically 4 (four) times daily.   dicyclomine (BENTYL) 20 MG tablet Take 1 tablet (20 mg total) by mouth 3 (three) times daily as needed (abdominal pain).   lamoTRIgine (LAMICTAL) 200 MG tablet TAKE 1 TABLET(200 MG) BY MOUTH TWICE DAILY   pantoprazole (PROTONIX) 40 MG tablet Take 1 tablet (40 mg total) by mouth 2 (two) times daily before a meal.   traZODone (DESYREL) 100 MG tablet Take 1-2 tablets (100-200 mg total) by mouth at bedtime as needed for sleep.   No facility-administered encounter medications on file as of 03/16/2022.     History: Past Medical History:  Diagnosis Date   Anxiety    Anxiety state 01/01/2018   Bipolar 1 disorder ([place])    Bipolar disorder ([place]) 12/12/2013   Depression     Diverticulitis    Diverticulitis of colon 03/15/2016   Hyperlipidemia 06/12/2020   Insomnia 01/01/2018   Tobacco abuse 06/25/2016   Tubular adenoma 10/02/2018   Past Surgical History:  Procedure Laterality Date   BIOPSY  05/15/2021   Procedure: BIOPSY;  Surgeon: Harvel Quale, MD;  [facility];  Service: Gastroenterology;;   BIOPSY  08/24/2021   Procedure: BIOPSY;  Surgeon: Irving Copas., MD;  Location: [facility];  Service: Gastroenterology;;   COLONOSCOPY     COLONOSCOPY N/A 08/08/2019   Procedure: COLONOSCOPY;  Surgeon: Rogene Houston, MD;  [facility];  Service: Endoscopy;  Laterality: N/A;  220   COLONOSCOPY WITH PROPOFOL N/A 05/15/2021   large ulcer at IC valve with negative biopsies for dysplasia. two tubular adenomas. Recommending surveillance in 6 months to assess ulcer healing. Patient prefers to undergo this at time of EGD for history of duodenal adenoma.   ENDOSCOPIC MUCOSAL RESECTION  08/24/2021   Dr. Rush Landmark: duodenal polyp s/p piecemeal removal, path tubulovillous adenoma. Repeat in 9-12 months.   ESOPHAGOGASTRODUODENOSCOPY (EGD) WITH PROPOFOL N/A 05/15/2021   negative H.pylori, duodenal adenoma. Referred for EMR.   ESOPHAGOGASTRODUODENOSCOPY (EGD) WITH PROPOFOL N/A 08/24/2021   Procedure: ESOPHAGOGASTRODUODENOSCOPY (EGD) WITH PROPOFOL;  Surgeon: Irving Copas., MD;  Location: [facility];  Service: Gastroenterology;  Laterality: N/A;   HEMOSTASIS CLIP PLACEMENT  08/24/2021   Procedure: HEMOSTASIS CLIP PLACEMENT;  Surgeon: Irving Copas., MD;  [facility];  Service: Gastroenterology;;   HOT HEMOSTASIS N/A 08/24/2021   Procedure: HOT HEMOSTASIS (ARGON PLASMA COAGULATION/BICAP);  Surgeon: Irving Copas., MD;  Location: [facility];  Service: Gastroenterology;  Laterality: N/A;   oophrorectomy     POLYPECTOMY  08/08/2019   Procedure: POLYPECTOMY;  Surgeon: Rogene Houston, MD;   [facility];  Service: Endoscopy;;   POLYPECTOMY  05/15/2021   Procedure: POLYPECTOMY;  Surgeon: Harvel Quale, MD;  [facility];  Service: Gastroenterology;;   SUBMUCOSAL LIFTING INJECTION  08/24/2021   Procedure: SUBMUCOSAL LIFTING INJECTION;  Surgeon: Irving Copas., MD;  Location: [facility];  Service: Gastroenterology;;   SUBMUCOSAL TATTOO INJECTION  08/24/2021   Procedure: SUBMUCOSAL TATTOO INJECTION;  Surgeon: Irving Copas., MD;  Location: [facility];  Service: Gastroenterology;;   TUBAL LIGATION      Family History  Problem Relation Age of Onset   Cancer Mother        lichen planus - oral cancer   Cancer Father        lung   Cancer Maternal Uncle        uterine   Diabetes Maternal Uncle    Stroke Maternal Grandmother    COPD Paternal Grandfather    Colon cancer Neg Hx    Esophageal cancer Neg Hx    Stomach cancer Neg Hx    Liver cancer Neg Hx    Pancreatic cancer Neg Hx    Social History   Occupational History   Occupation: Psychologist, 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[employer], [place]  Tobacco Use   Smoking status: Some Days    Types: Cigarettes    Last attempt to quit: 08/19/2021    Years since quitting: 0.5   Smokeless tobacco: Never  Vaping Use   Vaping Use: Never used  Substance and Sexual Activity   Alcohol use: Yes    Alcohol/week: 2.0 standard drinks of alcohol    Types: 2 Glasses of wine per week    Comment: socially   Drug use: No   Sexual activity: Not Currently    Birth control/protection: Post-menopausal    Tobacco Counseling Ready to quit: Not Answered Counseling given: Not Answered   Immunizations and Health Maintenance Immunization History  Administered Date(s) Administered   PFIZER(Purple Top)SARS-COV-2 Vaccination 11/17/2019   Health Maintenance Due  Topic Date Due   Medicare Annual Wellness (AWV)  Never done   Pneumonia Vaccine 33+ Years old (1 - PCV) Never done    DTaP/Tdap/Td (1 - Tdap) Never done   PAP SMEAR-Modifier  Never done   MAMMOGRAM  Never done   Zoster Vaccines- Shingrix (1 of 2) Never done   COVID-19 Vaccine (2 - 2023-24 season) 11/06/2021   DEXA SCAN  12/19/2021    Activities of Daily Living    03/16/2022    2:52 PM  In your present state of health, do you have any difficulty performing the following activities:  Hearing? 0  Vision? 0  Difficulty concentrating or making decisions? 0  Walking or climbing stairs? 0  Dressing or bathing? 0  Doing errands, shopping? 0    Physical Exam    General appearance:  Eyes: anicteric sclerae, moist conjunctivae; no lid-lag; PERRLA, tracking appropriately HENT: NCAT; oropharynx, MMM, no mucosal ulcerations; normal hard and soft palate Neck: Trachea midline;  FROM, supple, no lymphadenopathy, no JVD Lungs: CTAB, no crackles, no wheeze, with normal respiratory effort and no intercostal retractions CV: RRR, S1, S2, no MRGs  Abdomen: Soft, non-tender; non-distended, BS present  Extremities: No peripheral edema, radial and DP pulses present bilaterally  Skin: Normal temperature, turgor and texture; no rash Psych: Appropriate affect Neuro: Alert and oriented to person and place, no focal deficit    Advanced Directives:      Assessment:    This is a routine wellness examination for this patient . She is receivingi   Vision/Hearing screen Vision Screening   Right eye Left eye Both eyes  Without correction '20/30 20/40 20/25 '$  With correction       Dietary issues and exercise activities discussed:      Goals      Quit smoking / using tobacco       Depression Screen    03/16/2022    2:52 PM 03/16/2022    2:34 PM 02/10/2022    2:49 PM 04/01/2021    4:25 PM  PHQ 2/9 Scores  PHQ - 2 Score 0 0 0 0     Fall Risk    03/16/2022    2:52 PM  [place] in the past year? 0  Number falls in past yr: 0  Injury with Fall? 0    Cognitive Function:        03/16/2022    2:52 PM   6CIT Screen  What Year? 0 points  What month? 0 points  What time? 0 points  Count back from 20 0 points  Months in reverse 0 points  Repeat phrase 0 points  Total Score 0 points    Patient Care Team: Johnette Abraham, MD as PCP - General (Internal Medicine)     Plan:  Patient has some congestion today. I recommended nasal rinse and prescribed Atrovent. She had elevated LDL on prior lipid panel. We will repeat lipid panel today. Her screening mammogram , Dexa scan, and Pap Smear were completed at the Ob/Gyn office.  She will consider vaccination for pneumonia, zoster, COVID, and TDAP.    I have personally reviewed and noted the following in the patient's chart:   Medical and social history Use of alcohol, tobacco or illicit drugs  Current medications and supplements Functional ability and status Nutritional status Physical activity Advanced directives List of other physicians Hospitalizations, surgeries, and ER visits in previous 12 months Vitals Screenings to include cognitive, depression, and falls Referrals and appointments  In addition, I have reviewed and discussed with patient certain preventive protocols, quality metrics, and best practice recommendations. A written personalized care plan for preventive services as well as general preventive health recommendations were provided to patient.     Lorene Dy, MD 03/16/2022

## 2022-03-23 ENCOUNTER — Encounter: Payer: Self-pay | Admitting: Physician Assistant

## 2022-03-23 ENCOUNTER — Ambulatory Visit (INDEPENDENT_AMBULATORY_CARE_PROVIDER_SITE_OTHER): Payer: Medicare Other | Admitting: Physician Assistant

## 2022-03-23 ENCOUNTER — Ambulatory Visit: Payer: Medicare Other | Admitting: Gastroenterology

## 2022-03-23 DIAGNOSIS — F319 Bipolar disorder, unspecified: Secondary | ICD-10-CM

## 2022-03-23 DIAGNOSIS — G4700 Insomnia, unspecified: Secondary | ICD-10-CM | POA: Diagnosis not present

## 2022-03-23 DIAGNOSIS — F411 Generalized anxiety disorder: Secondary | ICD-10-CM | POA: Diagnosis not present

## 2022-03-23 DIAGNOSIS — Z87891 Personal history of nicotine dependence: Secondary | ICD-10-CM

## 2022-03-23 MED ORDER — ALPRAZOLAM 0.5 MG PO TABS: ORAL_TABLET | ORAL | 5 refills | Status: DC

## 2022-03-23 MED ORDER — LAMOTRIGINE 200 MG PO TABS
ORAL_TABLET | ORAL | 5 refills | Status: DC
Start: 2022-03-23 — End: 2022-09-14

## 2022-03-23 NOTE — Progress Notes (Signed)
[facility]  Patient ID: [name],  MRN: [id number]  PCP: Johnette Abraham, MD  Date of Evaluation: 03/23/2022 Time spent:20 minutes  Chief Complaint:  Chief Complaint   Anxiety; Depression; Insomnia; Follow-up    HISTORY/CURRENT STATUS: HPI  For routine med check.  Doing well.  Patient is able to enjoy things.  Has been with her boyfriend for around a year and it's going well.  Energy and motivation are good.  Work is going well, part time.   No extreme sadness, tearfulness, or feelings of hopelessness.  Sleeps well most of the time. ADLs and personal hygiene are normal.   Denies any changes in concentration, making decisions, or remembering things.  Appetite has not changed.  Weight is stable.  Anxiety is still a problem, gets overwhelmed at times, not having PA.  Xanax helps.   Denies suicidal or homicidal thoughts.  Patient denies increased energy with decreased need for sleep, increased talkativeness, racing thoughts, impulsivity or risky behaviors, increased spending, increased libido, grandiosity, increased irritability or anger, paranoia, or hallucinations.  Denies dizziness, syncope, seizures, numbness, tremor, tingling, tics, unsteady gait, slurred speech, confusion. Denies muscle or joint pain, stiffness, or dystonia.  Individual Medical History/ Review of Systems: Changes? :No       Past medications for mental health diagnoses include: Effexor, Ativan, Paxil, Lithobid, Xanax, Lamictal, trazodone, Saphris, Zoloft, Seroquel, BuSpar Wellbutrin caused tremor  Allergies: Meperidine and 5-alpha reductase inhibitors  Current Medications:  Current Outpatient Medications:    acetaminophen (TYLENOL) 500 MG tablet, Take 500 mg by mouth every 6 (six) hours as needed for mild pain or moderate pain., Disp: , Rfl:    busPIRone (BUSPAR) 15 MG tablet, TAKE 1 TABLET(15 MG) BY MOUTH TWICE DAILY, Disp: 180 tablet, Rfl: 0   diclofenac Sodium (VOLTAREN) 1 % GEL, Apply 4  g topically 4 (four) times daily., Disp: 150 g, Rfl: 0   dicyclomine (BENTYL) 20 MG tablet, Take 1 tablet (20 mg total) by mouth 3 (three) times daily as needed (abdominal pain)., Disp: 30 tablet, Rfl: 1   Multiple Vitamin (MULTIVITAMIN) capsule, Take 1 capsule by mouth daily., Disp: , Rfl:    Omega-3 Fatty Acids (FISH OIL) 300 MG CAPS, Take by mouth., Disp: , Rfl:    pantoprazole (PROTONIX) 40 MG tablet, Take 1 tablet (40 mg total) by mouth 2 (two) times daily before a meal., Disp: 60 tablet, Rfl: 12   traZODone (DESYREL) 100 MG tablet, Take 1-2 tablets (100-200 mg total) by mouth at bedtime as needed for sleep., Disp: 180 tablet, Rfl: 3   ALPRAZolam (XANAX) 0.5 MG tablet, TAKE 1 TABLET(0.5 MG) BY MOUTH THREE TIMES DAILY AS NEEDED FOR ANXIETY, Disp: 90 tablet, Rfl: 5   ipratropium (ATROVENT) 0.03 % nasal spray, Place 2 sprays into both nostrils every 12 (twelve) hours as needed for rhinitis. (Patient not taking: Reported on 03/23/2022), Disp: 30 mL, Rfl: 0   lamoTRIgine (LAMICTAL) 200 MG tablet, TAKE 1 TABLET(200 MG) BY MOUTH TWICE DAILY, Disp: 60 tablet, Rfl: 5 Medication Side Effects: none  Family Medical/ Social History: Changes? No  MENTAL HEALTH EXAM:  There were no vitals taken for this visit.There is no height or weight on file to calculate BMI.  General Appearance: Casual, Neat and Well Groomed  Eye Contact:  Good  Speech:  Clear and Coherent and Normal Rate  Volume:  Normal  Mood:  Euthymic  Affect:  Appropriate  Thought Process:  Goal Directed and Descriptions of Associations: Circumstantial  Orientation:  Full (Time, Place, and Person)  Thought Content: Logical   Suicidal Thoughts:  No  Homicidal Thoughts:  No  Memory:  WNL  Judgement:  Good  Insight:  Good  Psychomotor Activity:  Normal  Concentration:  Concentration: Good and Attention Span: Good  Recall:  Good  Fund of Knowledge: Good  Language: Good  Assets:  Desire for Improvement Financial  Resources/Insurance Housing Transportation Vocational/Educational  ADL's:  Intact  Cognition: WNL  Prognosis:  Good   DIAGNOSES:    ICD-10-CM   1. Bipolar I disorder ([place])  F31.9     2. Generalized anxiety disorder  F41.1     3. Insomnia, unspecified type  G47.00     4. Former smoker  Z87.891       Receiving Psychotherapy: No   RECOMMENDATIONS:  PDMP was reviewed.  Last Xanax filled 02/24/2022. I provided 20 minutes of face to face time during this encounter, including time spent before and after the visit in records review, medical decision making, counseling pertinent to today's visit, and charting.   Current treatment is effective so no changes needed.   Continue Xanax 0.5 mg, 1 p.o. 3 times daily as needed anxiety. Continue BuSpar 15 mg, 1 p.o. twice daily. Continue Lamictal 100 mg, 2 p.o. twice daily. Continue trazodone 100 mg, 1-2 nightly as needed sleep. Return in 6 months.  Donnal Moat, PA-C

## 2022-04-01 DIAGNOSIS — Z780 Asymptomatic menopausal state: Secondary | ICD-10-CM | POA: Diagnosis not present

## 2022-04-01 DIAGNOSIS — Z1231 Encounter for screening mammogram for malignant neoplasm of breast: Secondary | ICD-10-CM | POA: Diagnosis not present

## 2022-04-01 DIAGNOSIS — M8589 Other specified disorders of bone density and structure, multiple sites: Secondary | ICD-10-CM | POA: Diagnosis not present

## 2022-04-01 DIAGNOSIS — M810 Age-related osteoporosis without current pathological fracture: Secondary | ICD-10-CM | POA: Diagnosis not present

## 2022-04-01 LAB — HM DEXA SCAN

## 2022-04-01 LAB — HM MAMMOGRAPHY

## 2022-04-13 ENCOUNTER — Ambulatory Visit: Payer: Medicare Other | Admitting: Gastroenterology

## 2022-04-13 ENCOUNTER — Other Ambulatory Visit: Payer: Self-pay | Admitting: Gastroenterology

## 2022-04-13 ENCOUNTER — Encounter: Payer: Self-pay | Admitting: Gastroenterology

## 2022-04-13 VITALS — BP 103/66 | HR 77 | Temp 98.8°F | Ht 67.0 in | Wt 155.4 lb

## 2022-04-13 DIAGNOSIS — R1013 Epigastric pain: Secondary | ICD-10-CM

## 2022-04-13 DIAGNOSIS — R140 Abdominal distension (gaseous): Secondary | ICD-10-CM | POA: Diagnosis not present

## 2022-04-13 MED ORDER — SUCRALFATE 1 G PO TABS: 1.0000 g | ORAL_TABLET | Freq: Three times a day (TID) | ORAL | 0 refills | Status: DC

## 2022-04-13 NOTE — Patient Instructions (Signed)
Please have blood work done today.  Resume taking pantoprazole twice a day, 30 minutes before breakfast and dinner.  I have sent in carafate tablets to take before meals and at bedtime, up to 4 times a day.  Please let me know if no improvement with this!  We will see you in close follow-up in 6-8 weeks!  I enjoyed seeing you again today! At our first visit, I mentioned how I value our relationship and want to provide genuine, compassionate, and quality care. You may receive a survey regarding your visit with me, and I welcome your feedback! Thanks so much for taking the time to complete this. I look forward to seeing you again.   Annitta Needs, PhD, ANP-BC [facility]

## 2022-04-13 NOTE — Progress Notes (Signed)
Gastroenterology Office Note     Primary Care Physician:  Johnette Abraham, MD  Primary Gastroenterologist: Dr. Abbey Chatters    Chief Complaint   Chief Complaint  Patient presents with   Follow-up    Follow up on abd pain. Pt states past few months she has been bothered with it even after she eats     History of Present Illness   [name] is a 67 y.o. female presenting today in follow-up with a history of bipolar disorder, anxiety, depression, insomnia, hyperlipidemia, IBS and duodenal tubulovillous adenoma, who presents for follow up with abdominal pain.   Xifaxan prescribed at last visit. Unable to afford. Has had episodic epigastric pain over the years. Flaring up in abdomen. Bloated. No persistent diarrhea. Dicyclomine TID now without significant improvement. No fever/chills. No constipation. This year feels milder than last year. Yesterday had grilled cheese sandwich for lunch and peanut butter. Had chicken and rice with toast today, 1/2 chicken salad sandwich for lunch. Hurts in upper abdomen. And periumbilically.  If eating a lot, will feel like crap. No Nausea. Quit taking pantoprazole. Hasn't taken it in months.    [place] 2021 for similar symptoms negative. No gallstones. CT abd/pelvis in Feb 2023 no acute findings.     EMR of duodenal polyp June 2023 via piecemeal fashion. Path positive for tubulovillous adenoma. Repeat EGD for surveillance in 9-12 months.    March 2023: large ulcer at IC valve with negative biopsies for dysplasia. two tubular adenomas. Recommending surveillance in 6 months to assess ulcer healing. Patient prefers to undergo this at time of EGD for history of duodenal adenoma.    She is on recall with [facility] for EMR.    Past Medical History:  Diagnosis Date   Anxiety    Anxiety state 01/01/2018   Bipolar 1 disorder ([place])    Bipolar disorder ([place]) 12/12/2013   Depression    Diverticulitis    Diverticulitis of colon 03/15/2016    Hyperlipidemia 06/12/2020   Insomnia 01/01/2018   Tobacco abuse 06/25/2016   Tubular adenoma 10/02/2018    Past Surgical History:  Procedure Laterality Date   BIOPSY  05/15/2021   Procedure: BIOPSY;  Surgeon: Harvel Quale, MD;  Location: [facility];  Service: Gastroenterology;;   BIOPSY  08/24/2021   Procedure: BIOPSY;  Surgeon: Irving Copas., MD;  Location: [facility];  Service: Gastroenterology;;   COLONOSCOPY     COLONOSCOPY N/A 08/08/2019   Procedure: COLONOSCOPY;  Surgeon: Rogene Houston, MD;  Location: [facility];  Service: Endoscopy;  Laterality: N/A;  220   COLONOSCOPY WITH PROPOFOL N/A 05/15/2021   large ulcer at IC valve with negative biopsies for dysplasia. two tubular adenomas. Recommending surveillance in 6 months to assess ulcer healing. Patient prefers to undergo this at time of EGD for history of duodenal adenoma.   ENDOSCOPIC MUCOSAL RESECTION  08/24/2021   Dr. Rush Landmark: duodenal polyp s/p piecemeal removal, path tubulovillous adenoma. Repeat in 9-12 months.   ESOPHAGOGASTRODUODENOSCOPY (EGD) WITH PROPOFOL N/A 05/15/2021   negative H.pylori, duodenal adenoma. Referred for EMR.   ESOPHAGOGASTRODUODENOSCOPY (EGD) WITH PROPOFOL N/A 08/24/2021   Procedure: ESOPHAGOGASTRODUODENOSCOPY (EGD) WITH PROPOFOL;  Surgeon: Rush Landmark Telford Nab., MD;  Location: [facility];  Service: Gastroenterology;  Laterality: N/A;   HEMOSTASIS CLIP PLACEMENT  08/24/2021   Procedure: HEMOSTASIS CLIP PLACEMENT;  Surgeon: Irving Copas., MD;  Location: [facility];  Service: Gastroenterology;;   HOT HEMOSTASIS N/A 08/24/2021   Procedure: HOT  HEMOSTASIS (ARGON PLASMA COAGULATION/BICAP);  Surgeon: Irving Copas., MD;  Location: [facility];  Service: Gastroenterology;  Laterality: N/A;   oophrorectomy     POLYPECTOMY  08/08/2019   Procedure: POLYPECTOMY;  Surgeon: Rogene Houston, MD;  [facility]: [facility];  Service: Endoscopy;;   POLYPECTOMY   05/15/2021   Procedure: POLYPECTOMY;  Surgeon: Harvel Quale, MD;  [facility]: [facility];  Service: Gastroenterology;;   SUBMUCOSAL LIFTING INJECTION  08/24/2021   Procedure: SUBMUCOSAL LIFTING INJECTION;  Surgeon: Irving Copas., MD;  Location: [facility];  Service: Gastroenterology;;   SUBMUCOSAL TATTOO INJECTION  08/24/2021   Procedure: SUBMUCOSAL TATTOO INJECTION;  Surgeon: Irving Copas., MD;  Location: [facility];  Service: Gastroenterology;;   TUBAL LIGATION      Current Outpatient Medications  Medication Sig Dispense Refill   acetaminophen (TYLENOL) 500 MG tablet Take 500 mg by mouth every 6 (six) hours as needed for mild pain or moderate pain.     ALPRAZolam (XANAX) 0.5 MG tablet TAKE 1 TABLET(0.5 MG) BY MOUTH THREE TIMES DAILY AS NEEDED FOR ANXIETY 90 tablet 5   busPIRone (BUSPAR) 15 MG tablet TAKE 1 TABLET(15 MG) BY MOUTH TWICE DAILY 180 tablet 0   dicyclomine (BENTYL) 20 MG tablet Take 1 tablet (20 mg total) by mouth 3 (three) times daily as needed (abdominal pain). 30 tablet 1   lamoTRIgine (LAMICTAL) 200 MG tablet TAKE 1 TABLET(200 MG) BY MOUTH TWICE DAILY 60 tablet 5   Omega-3 Fatty Acids (FISH OIL) 300 MG CAPS Take by mouth.     pantoprazole (PROTONIX) 40 MG tablet Take 1 tablet (40 mg total) by mouth 2 (two) times daily before a meal. 60 tablet 12   traZODone (DESYREL) 100 MG tablet Take 1-2 tablets (100-200 mg total) by mouth at bedtime as needed for sleep. 180 tablet 3   diclofenac Sodium (VOLTAREN) 1 % GEL Apply 4 g topically 4 (four) times daily. (Patient not taking: Reported on 04/13/2022) 150 g 0   ipratropium (ATROVENT) 0.03 % nasal spray Place 2 sprays into both nostrils every 12 (twelve) hours as needed for rhinitis. (Patient not taking: Reported on 03/23/2022) 30 mL 0   Multiple Vitamin (MULTIVITAMIN) capsule Take 1 capsule by mouth daily. (Patient not taking: Reported on 04/13/2022)     No current facility-administered medications for  this visit.    Allergies as of 04/13/2022 - Review Complete 04/13/2022  Allergen Reaction Noted   Meperidine Hives 10/13/2017   5-alpha reductase inhibitors Other (See Comments) 08/08/2019    Family History  Problem Relation Age of Onset   Cancer Mother        lichen planus - oral cancer   Cancer Father        lung   Cancer Maternal Uncle        uterine   Diabetes Maternal Uncle    Stroke Maternal Grandmother    COPD Paternal Grandfather    Colon cancer Neg Hx    Esophageal cancer Neg Hx    Stomach cancer Neg Hx    Liver cancer Neg Hx    Pancreatic cancer Neg Hx     Social History   Socioeconomic History   Marital status: Divorced    Spouse name: Not on file   Number of children: 2   Years of education: 13   Highest education level: Not on file  Occupational History   Occupation: Psychologist, 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[employer], [place]  Tobacco Use   Smoking status:  Former    Types: Cigarettes    Quit date: 08/19/2021    Years since quitting: 0.6   Smokeless tobacco: Never  Vaping Use   Vaping Use: Never used  Substance and Sexual Activity   Alcohol use: Yes    Alcohol/week: 2.0 standard drinks of alcohol    Types: 2 Glasses of wine per week    Comment: socially   Drug use: No   Sexual activity: Not Currently    Birth control/protection: Post-menopausal  Other Topics Concern   Not on file  Social History Narrative   Lives alone   She has 2 sons that are close by.      She enjoys going out to dinner and spend time with family.      Diet reports eating all foods.   Caffeine none   Water 6 to 8 cups regularly daily.      Social Determinants of Health   Financial Resource Strain: Not on file  Food Insecurity: Not on file  Transportation Needs: Not on file  Physical Activity: Not on file  Stress: Not on file  Social Connections: Not on file  Intimate Partner Violence: Not on file     Review of Systems   Gen: Denies any fever, chills,  fatigue, weight loss, lack of appetite.  CV: Denies chest pain, heart palpitations, peripheral edema, syncope.  Resp: Denies shortness of breath at rest or with exertion. Denies wheezing or cough.  GI: Denies dysphagia or odynophagia. Denies jaundice, hematemesis, fecal incontinence. GU : Denies urinary burning, urinary frequency, urinary hesitancy MS: Denies joint pain, muscle weakness, cramps, or limitation of movement.  Derm: Denies rash, itching, dry skin Psych: Denies depression, anxiety, memory loss, and confusion Heme: Denies bruising, bleeding, and enlarged lymph nodes.   Physical Exam   BP 103/66   Pulse 77   Temp 98.8 F (37.1 C)   Ht '5\' 7"'$  (1.702 m)   Wt 155 lb 6.4 oz (70.5 kg)   BMI 24.34 kg/m  General:   Alert and oriented. Pleasant and cooperative. Well-nourished and well-developed.  Head:  Normocephalic and atraumatic. Eyes:  Without icterus Abdomen:  +BS, soft, mild TTP epigastric  and non-distended. No HSM noted. No guarding or rebound. No masses appreciated.  Rectal:  Deferred  Msk:  Symmetrical without gross deformities. Normal posture. Extremities:  Without edema. Neurologic:  Alert and  oriented x4;  grossly normal neurologically. Skin:  Intact without significant lesions or rashes. Psych:  Alert and cooperative. Normal mood and affect.   Assessment   [name] is a 67 y.o. female presenting today in follow-up with a history of bipolar disorder, anxiety, depression, insomnia, hyperlipidemia, IBS and duodenal tubulovillous adenoma, who presents for follow up with abdominal pain.   Abdominal pain: post-prandial, associated bloating. Flares episodically. Gallbladder present. Early satiety. Stopped PPI months ago. Will resume PPI BID for now, add Carafate. Check labs. May need further biliary evaluation. [place] in 2021 unrevealing. Could consider HIDA. No acute findings to warrant CT at this point.    Duodenal adenoma: on recall for EGD with EMR by [facility].  She would like to undergo colonoscopy at that time for surveillance of large ulcer at IC valve.     PLAN    CBC, CMP, lipase, celiac serologies PPI BID Carafate short term Call if no improvement Return in 6-8 weeks    Annitta Needs, PhD, [facility]enter[facility]

## 2022-04-16 DIAGNOSIS — N62 Hypertrophy of breast: Secondary | ICD-10-CM | POA: Diagnosis not present

## 2022-04-24 ENCOUNTER — Other Ambulatory Visit (INDEPENDENT_AMBULATORY_CARE_PROVIDER_SITE_OTHER): Payer: Self-pay | Admitting: Gastroenterology

## 2022-04-24 DIAGNOSIS — K589 Irritable bowel syndrome without diarrhea: Secondary | ICD-10-CM

## 2022-05-03 NOTE — Addendum Note (Signed)
Addended by: Lyndal Pulley on: 05/03/2022 05:00 PM   Modules accepted: Orders, Level of Service

## 2022-05-17 ENCOUNTER — Other Ambulatory Visit: Payer: Self-pay | Admitting: Physician Assistant

## 2022-05-21 DIAGNOSIS — H2511 Age-related nuclear cataract, right eye: Secondary | ICD-10-CM | POA: Diagnosis not present

## 2022-05-21 DIAGNOSIS — H2513 Age-related nuclear cataract, bilateral: Secondary | ICD-10-CM | POA: Diagnosis not present

## 2022-05-21 DIAGNOSIS — H2512 Age-related nuclear cataract, left eye: Secondary | ICD-10-CM | POA: Diagnosis not present

## 2022-05-25 ENCOUNTER — Ambulatory Visit: Payer: Medicare Other | Admitting: Gastroenterology

## 2022-05-25 DIAGNOSIS — R1013 Epigastric pain: Secondary | ICD-10-CM | POA: Diagnosis not present

## 2022-05-25 DIAGNOSIS — R140 Abdominal distension (gaseous): Secondary | ICD-10-CM | POA: Diagnosis not present

## 2022-05-27 ENCOUNTER — Ambulatory Visit: Payer: Medicare Other | Admitting: Gastroenterology

## 2022-05-27 ENCOUNTER — Encounter: Payer: Self-pay | Admitting: Gastroenterology

## 2022-05-27 VITALS — BP 110/75 | HR 62 | Temp 97.5°F | Ht 67.0 in | Wt 156.4 lb

## 2022-05-27 DIAGNOSIS — K589 Irritable bowel syndrome without diarrhea: Secondary | ICD-10-CM | POA: Diagnosis not present

## 2022-05-27 DIAGNOSIS — K317 Polyp of stomach and duodenum: Secondary | ICD-10-CM | POA: Diagnosis not present

## 2022-05-27 DIAGNOSIS — K219 Gastro-esophageal reflux disease without esophagitis: Secondary | ICD-10-CM

## 2022-05-27 LAB — COMPREHENSIVE METABOLIC PANEL
ALT: 10 IU/L (ref 0–32)
AST: 14 IU/L (ref 0–40)
Albumin/Globulin Ratio: 2.4 — ABNORMAL HIGH (ref 1.2–2.2)
Albumin: 4.6 g/dL (ref 3.9–4.9)
Alkaline Phosphatase: 110 IU/L (ref 44–121)
BUN/Creatinine Ratio: 13 (ref 12–28)
BUN: 11 mg/dL (ref 8–27)
Bilirubin Total: 0.4 mg/dL (ref 0.0–1.2)
CO2: 24 mmol/L (ref 20–29)
Calcium: 9.7 mg/dL (ref 8.7–10.3)
Chloride: 101 mmol/L (ref 96–106)
Creatinine, Ser: 0.86 mg/dL (ref 0.57–1.00)
Globulin, Total: 1.9 g/dL (ref 1.5–4.5)
Glucose: 84 mg/dL (ref 70–99)
Potassium: 4.1 mmol/L (ref 3.5–5.2)
Sodium: 140 mmol/L (ref 134–144)
Total Protein: 6.5 g/dL (ref 6.0–8.5)
eGFR: 75 mL/min/{1.73_m2} (ref >59)

## 2022-05-27 LAB — TISSUE TRANSGLUTAMINASE, IGA: Transglutaminase IgA: <2 U/mL (ref 0–3)

## 2022-05-27 LAB — IGA: IgA/Immunoglobulin A, Serum: 160 mg/dL (ref 87–352)

## 2022-05-27 LAB — LIPASE: Lipase: 27 U/L (ref 14–72)

## 2022-05-27 NOTE — Patient Instructions (Addendum)
I would limit dicyclomine, as this can cause constipation.  Continue pantoprazole twice a day. You can stop carafate once this is all completed. Let me know if symptoms return!  I am reaching out to the nurse who works with Dr. Rush Landmark regarding your procedures.  I recommend following the LOW FODMAP diet for a few weeks, then slowly reintroduce foods from the high FODMAP column. See if you can notice any triggers!  We will see you in 6 months!  I enjoyed seeing you again today! At our first visit, I mentioned how I value our relationship and want to provide genuine, compassionate, and quality care. You may receive a survey regarding your visit with me, and I welcome your feedback! Thanks so much for taking the time to complete this. I look forward to seeing you again.   Annitta Needs, PhD, ANP-BC [facility]astroenterology

## 2022-05-27 NOTE — Progress Notes (Signed)
Gastroenterology Office Note     Primary Care Physician:  Johnette Abraham, MD  Primary Gastroenterologist: Dr. Abbey Chatters    Chief Complaint   Chief Complaint  Patient presents with   Follow-up    GERD and IBS     History of Present Illness   [name] is a 67 y.o. female presenting today in follow-up with a history of  bipolar disorder, anxiety, depression, insomnia, hyperlipidemia, IBS and duodenal tubulovillous adenoma, who presents for follow up with abdominal pain.    [place] 2021 for similar symptoms negative. No gallstones. CT abd/pelvis in Feb 2023 no acute findings.    Abdominal pain: Flares episodically. Chronic. Gallbladder present. She feels better since starting PPI BID from last visit.  Notes early satiety. Sometimes burning in LUQ. Intermittent pain not postprandial. Had constipation this week. Took 2 senokot for 2 days and felt better. Dicyclomine TID this week.    Sometimes stomach bloating. Celiac serologies negative.      EMR of duodenal polyp June 2023 via piecemeal fashion. Path positive for tubulovillous adenoma. Repeat EGD for surveillance in 9-12 months.     March 2023: large ulcer at IC valve with negative biopsies for dysplasia. two tubular adenomas. Recommending surveillance in 6 months to assess ulcer healing. Patient prefers to undergo this at time of EGD for history of duodenal adenoma.          Past Medical History:  Diagnosis Date   Anxiety    Anxiety state 01/01/2018   Bipolar 1 disorder ([place])    Bipolar disorder ([place]) 12/12/2013   Depression    Diverticulitis    Diverticulitis of colon 03/15/2016   Hyperlipidemia 06/12/2020   Insomnia 01/01/2018   Tobacco abuse 06/25/2016   Tubular adenoma 10/02/2018    Past Surgical History:  Procedure Laterality Date   BIOPSY  05/15/2021   Procedure: BIOPSY;  Surgeon: Harvel Quale, MD;  [facility]: [facility];  Service: Gastroenterology;;   BIOPSY  08/24/2021   Procedure:  BIOPSY;  Surgeon: Irving Copas., MD;  Location: [facility];  Service: Gastroenterology;;   COLONOSCOPY     COLONOSCOPY N/A 08/08/2019   Procedure: COLONOSCOPY;  Surgeon: Rogene Houston, MD;  Location: [facility];  Service: Endoscopy;  Laterality: N/A;  220   COLONOSCOPY WITH PROPOFOL N/A 05/15/2021   large ulcer at IC valve with negative biopsies for dysplasia. two tubular adenomas. Recommending surveillance in 6 months to assess ulcer healing. Patient prefers to undergo this at time of EGD for history of duodenal adenoma.   ENDOSCOPIC MUCOSAL RESECTION  08/24/2021   Dr. Rush Landmark: duodenal polyp s/p piecemeal removal, path tubulovillous adenoma. Repeat in 9-12 months.   ESOPHAGOGASTRODUODENOSCOPY (EGD) WITH PROPOFOL N/A 05/15/2021   negative H.pylori, duodenal adenoma. Referred for EMR.   ESOPHAGOGASTRODUODENOSCOPY (EGD) WITH PROPOFOL N/A 08/24/2021   Procedure: ESOPHAGOGASTRODUODENOSCOPY (EGD) WITH PROPOFOL;  Surgeon: Rush Landmark Telford Nab., MD;  Location: [facility];  Service: Gastroenterology;  Laterality: N/A;   HEMOSTASIS CLIP PLACEMENT  08/24/2021   Procedure: HEMOSTASIS CLIP PLACEMENT;  Surgeon: Irving Copas., MD;  Location: [facility];  Service: Gastroenterology;;   HOT HEMOSTASIS N/A 08/24/2021   Procedure: HOT HEMOSTASIS (ARGON PLASMA COAGULATION/BICAP);  Surgeon: Irving Copas., MD;  Location: [facility];  Service: Gastroenterology;  Laterality: N/A;   oophrorectomy     POLYPECTOMY  08/08/2019   Procedure: POLYPECTOMY;  Surgeon: Rogene Houston, MD;  [facility];  Service: Endoscopy;;   POLYPECTOMY  05/15/2021  Procedure: POLYPECTOMY;  Surgeon: Harvel Quale, MD;  [facility];  Service: Gastroenterology;;   SUBMUCOSAL LIFTING INJECTION  08/24/2021   Procedure: SUBMUCOSAL LIFTING INJECTION;  Surgeon: Irving Copas., MD;  Location: [facility];  Service: Gastroenterology;;   SUBMUCOSAL TATTOO  INJECTION  08/24/2021   Procedure: SUBMUCOSAL TATTOO INJECTION;  Surgeon: Irving Copas., MD;  Location: [facility];  Service: Gastroenterology;;   TUBAL LIGATION      Current Outpatient Medications  Medication Sig Dispense Refill   acetaminophen (TYLENOL) 500 MG tablet Take 500 mg by mouth every 6 (six) hours as needed for mild pain or moderate pain.     ALPRAZolam (XANAX) 0.5 MG tablet TAKE 1 TABLET(0.5 MG) BY MOUTH THREE TIMES DAILY AS NEEDED FOR ANXIETY 90 tablet 5   busPIRone (BUSPAR) 15 MG tablet TAKE 1 TABLET(15 MG) BY MOUTH TWICE DAILY 180 tablet 0   dicyclomine (BENTYL) 20 MG tablet TAKE 1 TABLET(20 MG) BY MOUTH THREE TIMES DAILY AS NEEDED FOR ABDOMINAL PAIN 30 tablet 1   lamoTRIgine (LAMICTAL) 200 MG tablet TAKE 1 TABLET(200 MG) BY MOUTH TWICE DAILY 60 tablet 5   pantoprazole (PROTONIX) 40 MG tablet Take 1 tablet (40 mg total) by mouth 2 (two) times daily before a meal. 60 tablet 12   sucralfate (CARAFATE) 1 g tablet TAKE 1 TABLET(1 GRAM) BY MOUTH FOUR TIMES DAILY AT BEDTIME WITH MEALS 360 tablet 0   traZODone (DESYREL) 100 MG tablet Take 1-2 tablets (100-200 mg total) by mouth at bedtime as needed for sleep. 180 tablet 3   Omega-3 Fatty Acids (FISH OIL) 300 MG CAPS Take by mouth. (Patient not taking: Reported on 05/27/2022)     No current facility-administered medications for this visit.    Allergies as of 05/27/2022 - Review Complete 05/27/2022  Allergen Reaction Noted   Meperidine Hives 10/13/2017   5-alpha reductase inhibitors Other (See Comments) 08/08/2019    Family History  Problem Relation Age of Onset   Cancer Mother        lichen planus - oral cancer   Cancer Father        lung   Cancer Maternal Uncle        uterine   Diabetes Maternal Uncle    Stroke Maternal Grandmother    COPD Paternal Grandfather    Colon cancer Neg Hx    Esophageal cancer Neg Hx    Stomach cancer Neg Hx    Liver cancer Neg Hx    Pancreatic cancer Neg Hx     Social History    Socioeconomic History   Marital status: Divorced    Spouse name: Not on file   Number of children: 2   Years of education: 13   Highest education level: Not on file  Occupational History   Occupation: Psychologist, 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[employer], [place]  Tobacco Use   Smoking status: Former    Types: Cigarettes    Quit date: 08/19/2021    Years since quitting: 0.7   Smokeless tobacco: Never  Vaping Use   Vaping Use: Never used  Substance and Sexual Activity   Alcohol use: Yes    Alcohol/week: 2.0 standard drinks of alcohol    Types: 2 Glasses of wine per week    Comment: socially   Drug use: No   Sexual activity: Not Currently    Birth control/protection: Post-menopausal  Other Topics Concern   Not on file  Social History Narrative   Lives alone   She  has 2 sons that are close by.      She enjoys going out to dinner and spend time with family.      Diet reports eating all foods.   Caffeine none   Water 6 to 8 cups regularly daily.      Social Determinants of Health   Financial Resource Strain: Not on file  Food Insecurity: Not on file  Transportation Needs: Not on file  Physical Activity: Not on file  Stress: Not on file  Social Connections: Not on file  Intimate Partner Violence: Not on file     Review of Systems   Gen: Denies any fever, chills, fatigue, weight loss, lack of appetite.  CV: Denies chest pain, heart palpitations, peripheral edema, syncope.  Resp: Denies shortness of breath at rest or with exertion. Denies wheezing or cough.  GI: Denies dysphagia or odynophagia. Denies jaundice, hematemesis, fecal incontinence. GU : Denies urinary burning, urinary frequency, urinary hesitancy MS: Denies joint pain, muscle weakness, cramps, or limitation of movement.  Derm: Denies rash, itching, dry skin Psych: Denies depression, anxiety, memory loss, and confusion Heme: Denies bruising, bleeding, and enlarged lymph nodes.   Physical Exam   BP  110/75   Pulse 62   Temp (!) 97.5 F (36.4 C)   Ht 5\' 7"  (1.702 m)   Wt 156 lb 6.4 oz (70.9 kg)   BMI 24.50 kg/m  General:   Alert and oriented. Pleasant and cooperative. Well-nourished and well-developed.  Head:  Normocephalic and atraumatic. Eyes:  Without icterus Abdomen:  +BS, soft, non-tender and non-distended. No HSM noted. No guarding or rebound. No masses appreciated.  Rectal:  Deferred  Msk:  Symmetrical without gross deformities. Normal posture. Extremities:  Without edema. Neurologic:  Alert and  oriented x4;  grossly normal neurologically. Skin:  Intact without significant lesions or rashes. Psych:  Alert and cooperative. Normal mood and affect.  Lab Results  Component Value Date   WBC 6.0 06/23/2021   HGB 14.9 06/23/2021   HCT 42.9 06/23/2021   MCV 102.7 (H) 06/23/2021   PLT 275.0 06/23/2021   Lab Results  Component Value Date   ALT 10 05/25/2022   AST 14 05/25/2022   ALKPHOS 110 05/25/2022   BILITOT 0.4 05/25/2022   Lab Results  Component Value Date   CREATININE 0.86 05/25/2022   BUN 11 05/25/2022   NA 140 05/25/2022   K 4.1 05/25/2022   CL 101 05/25/2022   CO2 24 05/25/2022   Lab Results  Component Value Date   LIPASE 27 05/25/2022      Assessment   [name] is a 67 y.o. female presenting today in follow-up with a history of   bipolar disorder, anxiety, depression, insomnia, hyperlipidemia, IBS and duodenal tubulovillous adenoma, chronic abdominal pain.   Chronic abdominal pain: some improvement with PPI BID and Carafate. Continue BID PPI and finish course of carafate. She will call if she feels symptoms worsen off Carafate and we could extend a bit. I have asked her to limit dicyclomine, as this can cause constipation. We will trial low FODMAP diet. Does not appear to be biliary-related. No alarm signs/symptoms. Prior US 2021 without gallstones, and CT abd/pelvis in Feb 2023 no acute findings. CTA 2020 with patent mesenteric vasculature.  Celiac serologies negative.       EMR of duodenal polyp June 2023 via piecemeal fashion. Path positive for tubulovillous adenoma. Repeat EGD for surveillance in 9-12 months.     March 2023: large ulcer at  IC valve with negative biopsies for dysplasia. two tubular adenomas. Recommending surveillance in 6 months to assess ulcer healing. Patient prefers to undergo this at time of EGD for history of duodenal adenoma.       PLAN    Continue PPI BID for now as she has noted improvement Complete course of Carafate Referral to [facility] for EGD/colonoscopy: I have sent message to Dr. Rush Landmark and Koren Shiver, RN, due to time constraints with patient Limit dicyclomine Low FODMAP diet trial 6 month follow-up IF food aversion, weight loss, update CTA  Consider HIDA if any biliary symptoms.    Annitta Needs, PhD, ANP-BC [facility] Gastroenterology

## 2022-06-02 ENCOUNTER — Telehealth: Payer: Self-pay | Admitting: Gastroenterology

## 2022-06-02 NOTE — Telephone Encounter (Addendum)
HI Dr. Rush Landmark,  Patient called and stated she previously had a small bowel endoscopy procedure with you in June 2023, the patient called to schedule another one with you however it looks like she saw [facility] in October 2023, February and March of 2024. Stated she has a large polyp and they recommended she see you again. She also said she is having a breast reduction on 528/24 and she is wanting to have the procedure 6 weeks prior. Please advise on scheduling.   Thanks

## 2022-06-02 NOTE — Telephone Encounter (Signed)
Although I replied to the GI team in [place] when they had reached out in the last week or so, for some reason I cannot see that message on the CC chart/patient call. In either case unfortunately my availability is such that I cannot get her in 6 weeks prior to this. I can offer her May 16 or May 20 or May 22 or May 23 otherwise she will need to wait until after she has completed her procedure from a breast reduction standpoint and be scheduled later this summer.  Freight forwarder for [facility], You may offer the patient enteroscopy/colonoscopy 2-hour slot for 1 of those dates otherwise if the patient does not agree to it she will have to wait till the summer.  Thanks. GM

## 2022-06-02 NOTE — Telephone Encounter (Signed)
Thanks for the update [employer]

## 2022-06-02 NOTE — Telephone Encounter (Signed)
Left message for patient

## 2022-06-04 DIAGNOSIS — H2513 Age-related nuclear cataract, bilateral: Secondary | ICD-10-CM | POA: Diagnosis not present

## 2022-06-04 DIAGNOSIS — H2512 Age-related nuclear cataract, left eye: Secondary | ICD-10-CM | POA: Diagnosis not present

## 2022-06-07 ENCOUNTER — Telehealth: Payer: Self-pay

## 2022-06-07 NOTE — Telephone Encounter (Signed)
-----   Message from Irving Copas., MD sent at 05/27/2022  5:01 PM EDT ----- Regarding: RE: Procedures AWB, We can certainly try to get her in before May but based on my schedule it may not be until June.  If she needs time to recover from her surgery that may not be unreasonable either. Just depends on what day in May her surgery is scheduled for.  Shaneka Efaw, When you return, can you reach out to the patient and schedule her for enteroscopy/colonoscopy with scheduled this as a 2-hour procedure in case she has anything else that we need to remove from that large TVA of the duodenum. If it does not work with her schedule, then it can be done later this year. Thanks. GM ----- Message ----- From: Annitta Needs, NP Sent: 05/27/2022  11:00 AM EDT To: Timothy Lasso, RN; Annitta Needs, NP; # Subject: Procedures                                     Hi, Cleotha Whalin and Dr. Rush Landmark!  This is a mutual patient who is due for surveillance EGD and colonoscopy (history of duodenal tubulovillous adenoma removed via piecemeal fashion in June 2023 and large ulcer at [facility] valve on colonoscopy in March 2023). She preferred to have colonoscopy done with EGD.   Anyway, she has a breast reduction surgery upcoming end of May 2024. She is wondering if her procedures can be done prior to that, as she will be out of commission for awhile following breast reduction.  Thanks so much for any help you may be able to offer in expediting her evaluation!  Vicente Males

## 2022-06-07 NOTE — Telephone Encounter (Signed)
See alternate phone note  

## 2022-06-08 ENCOUNTER — Other Ambulatory Visit: Payer: Self-pay

## 2022-06-08 DIAGNOSIS — R6881 Early satiety: Secondary | ICD-10-CM

## 2022-06-08 DIAGNOSIS — R110 Nausea: Secondary | ICD-10-CM

## 2022-06-08 DIAGNOSIS — R933 Abnormal findings on diagnostic imaging of other parts of digestive tract: Secondary | ICD-10-CM

## 2022-06-08 DIAGNOSIS — K317 Polyp of stomach and duodenum: Secondary | ICD-10-CM

## 2022-06-08 DIAGNOSIS — R1013 Epigastric pain: Secondary | ICD-10-CM

## 2022-06-08 MED ORDER — NA SULFATE-K SULFATE-MG SULF 17.5-3.13-1.6 GM/177ML PO SOLN: 1.0000 | Freq: Once | ORAL | 0 refills | Status: AC

## 2022-06-08 NOTE — Telephone Encounter (Signed)
I have spoken with the pt and she prefers to move the appt to 08/12/22 at 730 am with GM at [facility].  She has been instructed and will adjust her previously sent instructions to reflect the new date and time.

## 2022-06-08 NOTE — Telephone Encounter (Signed)
The pt has been scheduled for enteroscopy/colonoscopy on 07/22/22 at 830 am at [facility] with GM   Left message on machine to call back

## 2022-06-12 IMAGING — CT CT ABD-PELV W/ CM
2 of 5 series · 16 of 46 positions shown, 18 images · IV contrast (Omnipaque or Isovue)
Comparison: 02/24/2020

CLINICAL DATA: Left lower quadrant abdominal pain. History of
diverticulosis.

EXAM:
CT ABDOMEN AND PELVIS WITH CONTRAST
TECHNIQUE: Multidetector CT imaging of the abdomen and pelvis was performed
using the standard protocol following bolus administration of
intravenous contrast.

[Series 2: axial st · axial · 0.85mm/px · z∈[+897,+1322]mm · 13 of 95 slices shown, 15 images]
[im 5/95  soft-tissue]
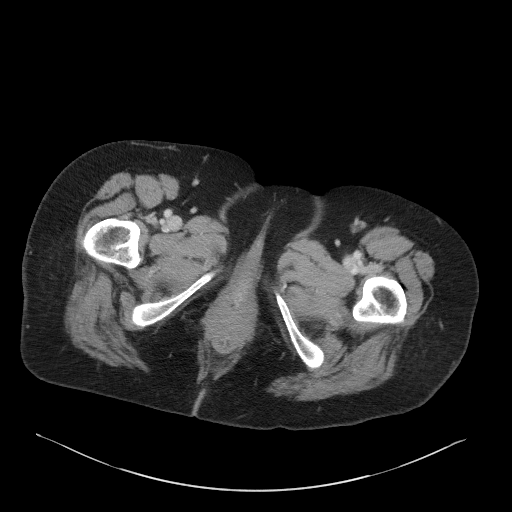
[im 5/95  bone]
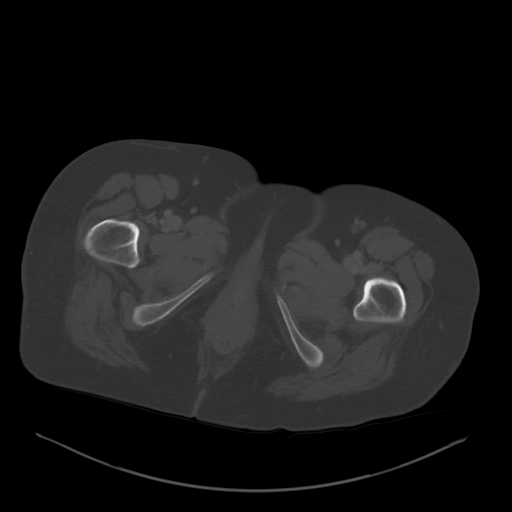
[im 15/95  soft-tissue]
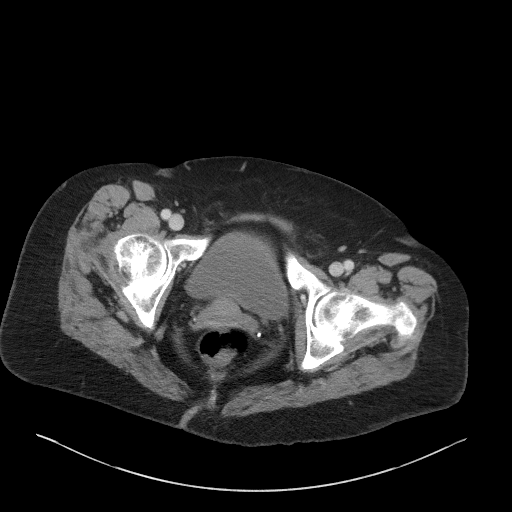
[im 20/95  soft-tissue]
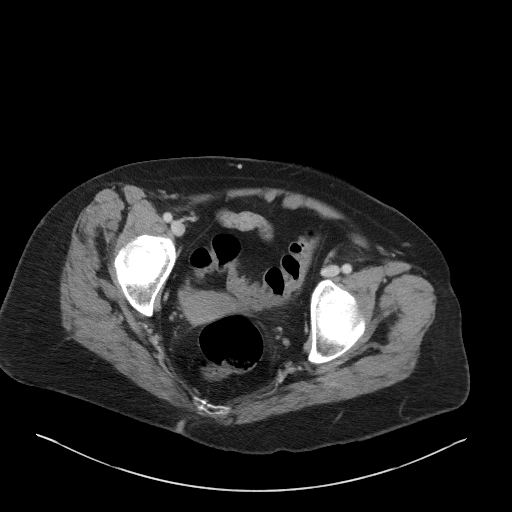
[im 25/95  soft-tissue]
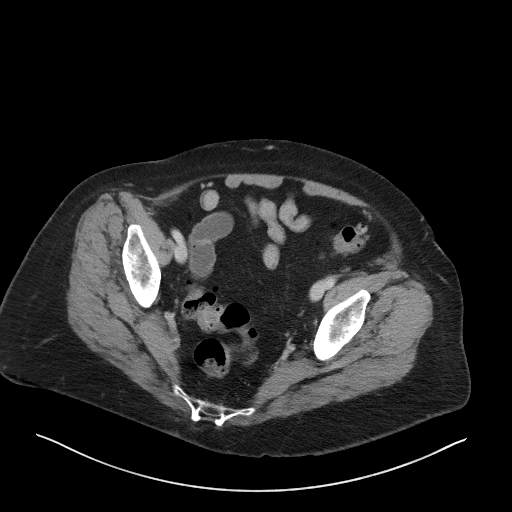
[im 35/95  soft-tissue]
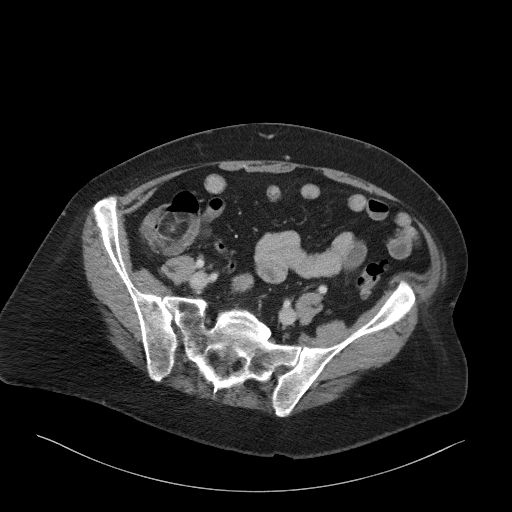
[im 40/95  soft-tissue]
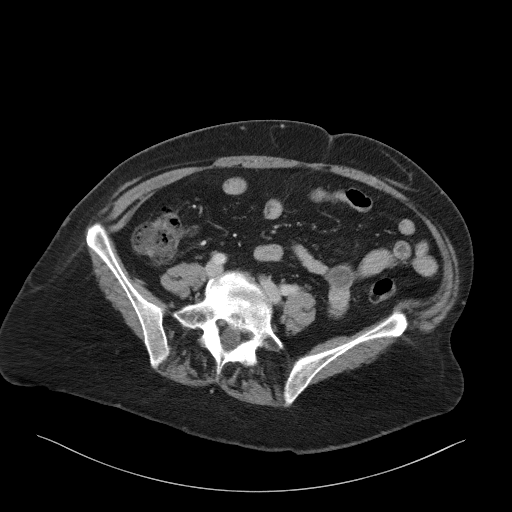
[im 50/95  soft-tissue]
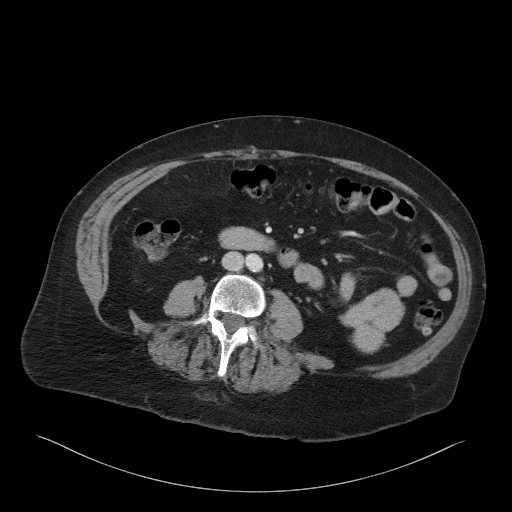
[im 55/95  soft-tissue]
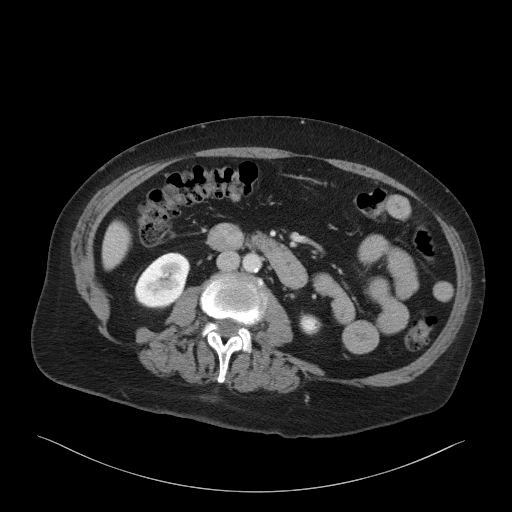
[im 60/95  soft-tissue]
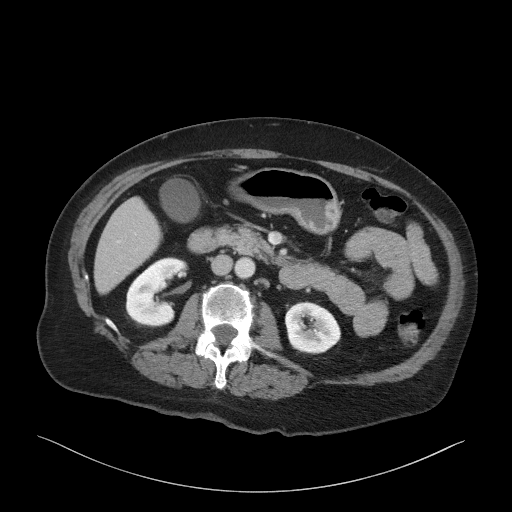
[im 60/95  bone]
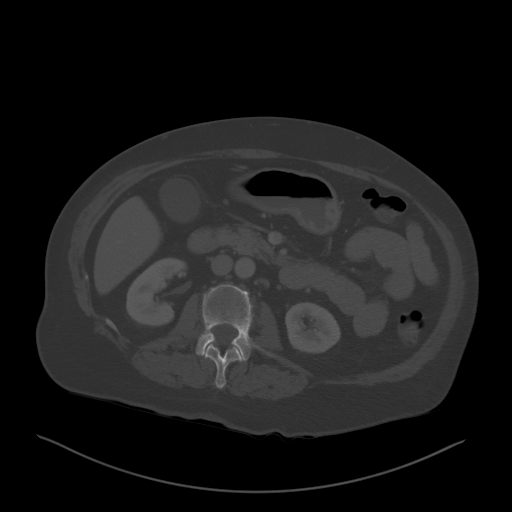
[im 70/95  soft-tissue]
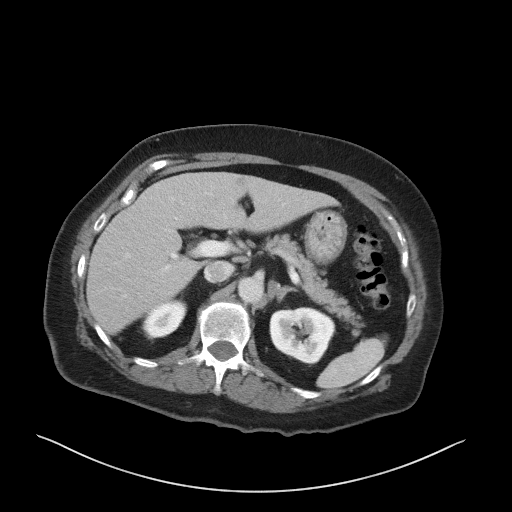
[im 75/95  soft-tissue]
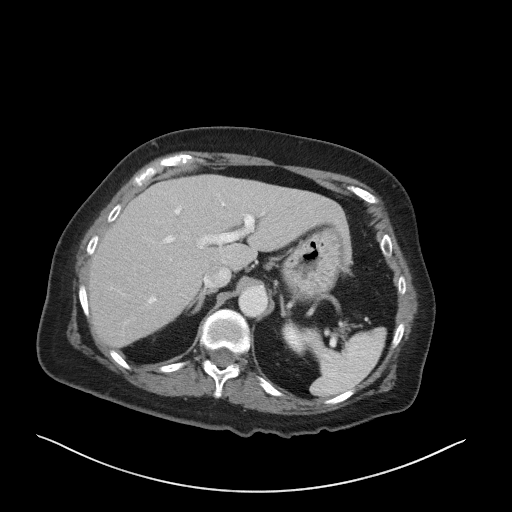
[im 80/95  soft-tissue]
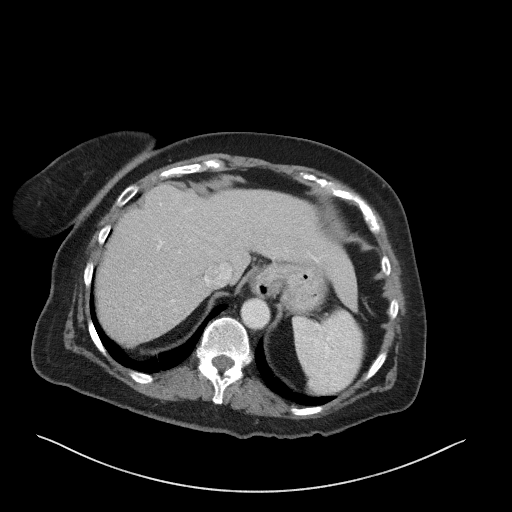
[im 90/95  soft-tissue]
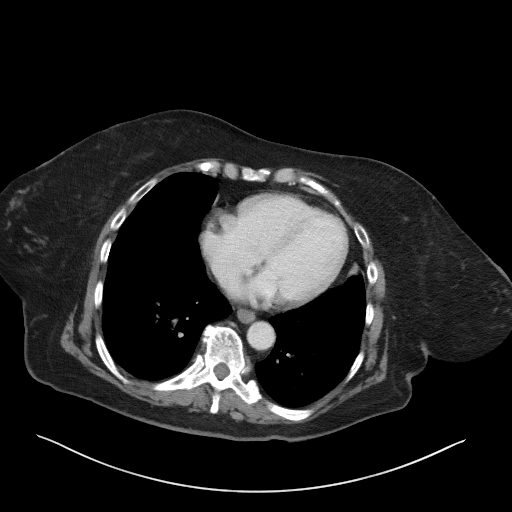

[Series 5: coronal st · coronal · 0.89mm/px · 3 of 106 slices shown]
[im 36/106  soft-tissue]
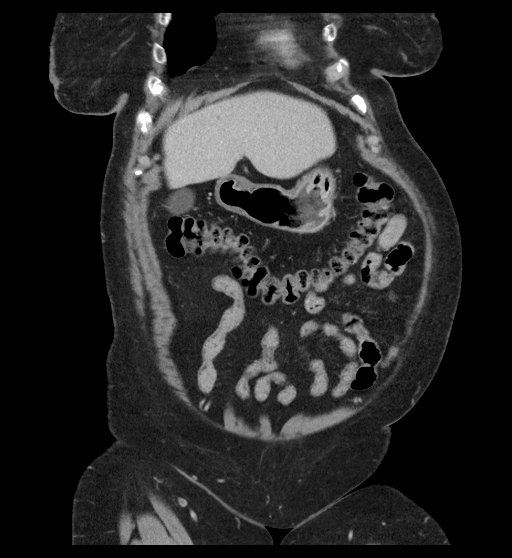
[im 47/106  soft-tissue]
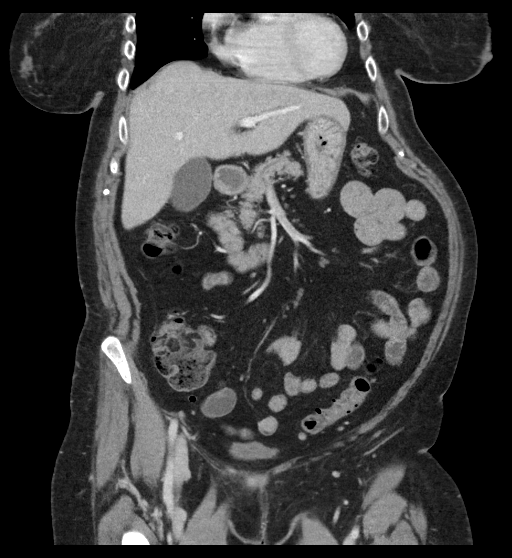
[im 59/106  soft-tissue]
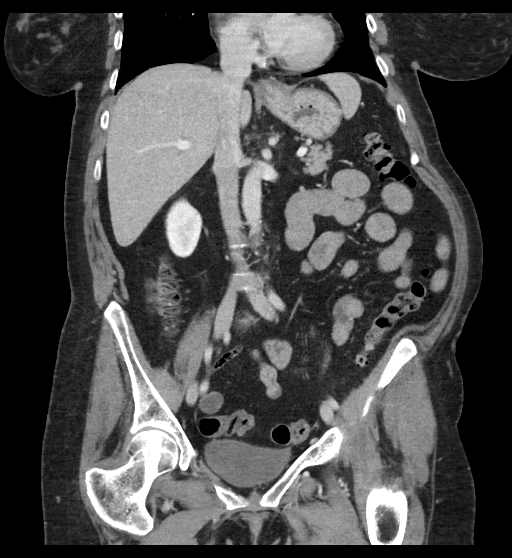

[16 of 46 positions shown; findings below may reference images not displayed]

RADIATION DOSE REDUCTION: This exam was performed according to the
departmental dose-optimization program which includes automated
exposure control, adjustment of the mA and/or kV according to
patient size and/or use of iterative reconstruction technique.

CONTRAST:  100mL OMNIPAQUE IOHEXOL 300 MG/ML  SOLN
FINDINGS: Lower chest: Mild scarring at the lung bases. No active process.
Small hiatal hernia.

Hepatobiliary: Liver parenchyma is normal.  No calcified gallstones.

Pancreas: Normal

Spleen: Normal

Adrenals/Urinary Tract: Adrenal glands are normal. Kidneys are
normal. Bladder is normal.

Stomach/Bowel: Small hiatal hernia as noted above. Stomach otherwise
appears normal. No abnormal small bowel finding. Normal appendix.
There is diverticulosis of the left colon but no diverticulitis. No
evidence of constipation or obstruction. No sign of any inflamed
bowel.

Vascular/Lymphatic: Aortic atherosclerosis. No aneurysm. IVC is
normal. No adenopathy.

Reproductive: No pelvic mass.

Other: No free fluid or air.

Musculoskeletal: Ordinary lower lumbar degenerative changes.
IMPRESSION: No change. No abnormality seen to explain the clinical presentation.
Patient has ordinary diverticulosis but there is no sign of
diverticulitis. No evidence of constipation or obstruction. No
hernia.

Small hiatal hernia.

Aortic atherosclerosis, mild.

Ordinary lower lumbar degenerative changes.

## 2022-06-15 ENCOUNTER — Encounter (INDEPENDENT_AMBULATORY_CARE_PROVIDER_SITE_OTHER): Payer: Self-pay | Admitting: Gastroenterology

## 2022-06-23 ENCOUNTER — Telehealth: Payer: Self-pay | Admitting: Gastroenterology

## 2022-06-23 NOTE — Telephone Encounter (Signed)
The pt is having breast reduction surgery and states the surgeon would like information on anesthesia the pt will be getting with colon, how long the procedure will take, and description of what will be done.  I have provided her with our fax number to have the form completed that the surgeon is asking for.

## 2022-06-23 NOTE — Telephone Encounter (Signed)
Patient is calling wishing to speak with a nurse regarding a sooner procedure, also has some questions that her doctor was asking given that she is planning on having a breast reduction he is wanting some information regarding the procedure. Please advise

## 2022-06-23 NOTE — Telephone Encounter (Signed)
Inbound call from patient returning call. Please advise. 

## 2022-06-28 ENCOUNTER — Telehealth: Payer: Self-pay | Admitting: Gastroenterology

## 2022-06-28 NOTE — Telephone Encounter (Signed)
I have spoken to the pt and she has a form that her surgeon would like completed regarding the type of sedation we will be using for her June enteroscopy.  She will upload the form and send via My Chart for completion.

## 2022-06-28 NOTE — Telephone Encounter (Signed)
PT  is having  a breast reduction on 6/25 and they need to know what anesthesia will be used for the procedure. She has some paperwork that needs to be verified and would like to speak with a nurse about this. Please advise.

## 2022-08-05 ENCOUNTER — Encounter: Payer: Self-pay | Admitting: Gastroenterology

## 2022-08-05 ENCOUNTER — Encounter (HOSPITAL_COMMUNITY): Payer: Self-pay | Admitting: Gastroenterology

## 2022-08-11 NOTE — Anesthesia Preprocedure Evaluation (Signed)
Anesthesia Evaluation  Patient identified by MRN, date of birth, ID band Patient awake    Reviewed: Allergy & Precautions, NPO status , Patient's Chart, lab work & pertinent test results  Airway Mallampati: II  TM Distance: >3 FB Neck ROM: Full    Dental no notable dental hx. (+) Teeth Intact, Dental Advisory Given, Implants   Pulmonary Patient abstained from smoking., former smoker   Pulmonary exam normal breath sounds clear to auscultation       Cardiovascular Normal cardiovascular exam Rhythm:Regular Rate:Normal  03/2019 Nuclear stress EF: 78%   Neuro/Psych Seizures - (last 9 months ago), Well Controlled,  PSYCHIATRIC DISORDERS Anxiety Depression Bipolar Disorder      GI/Hepatic ,GERD  Medicated and Controlled,,duodenal tubulovillous adenoma   Endo/Other    Renal/GU negative Renal ROS     Musculoskeletal   Abdominal   Peds  Hematology   Anesthesia Other Findings ALL: meperidine, 5 Alpha reductase inhibitors  Reproductive/Obstetrics                              Anesthesia Physical Anesthesia Plan  ASA: 3  Anesthesia Plan: MAC   Post-op Pain Management: Minimal or no pain anticipated   Induction: Intravenous  PONV Risk Score and Plan: 2 and Propofol infusion and Treatment may vary due to age or medical condition  Airway Management Planned: Nasal Cannula and Simple Face Mask  Additional Equipment: None  Intra-op Plan:   Post-operative Plan:   Informed Consent: I have reviewed the patients History and Physical, chart, labs and discussed the procedure including the risks, benefits and alternatives for the proposed anesthesia with the patient or authorized representative who has indicated his/her understanding and acceptance.     Dental advisory given  Plan Discussed with:   Anesthesia Plan Comments: (Enteroscopy for duodenal polyps)         Anesthesia Quick  Evaluation

## 2022-08-12 ENCOUNTER — Ambulatory Visit (HOSPITAL_COMMUNITY): Payer: Medicare Other | Admitting: Anesthesiology

## 2022-08-12 ENCOUNTER — Other Ambulatory Visit: Payer: Self-pay

## 2022-08-12 ENCOUNTER — Encounter (HOSPITAL_COMMUNITY)
Admission: RE | Disposition: A | Discharge status: Home / Self Care | Payer: Self-pay | Source: Home / Self Care | Attending: Gastroenterology

## 2022-08-12 ENCOUNTER — Encounter (HOSPITAL_COMMUNITY): Payer: Self-pay | Admitting: Gastroenterology

## 2022-08-12 ENCOUNTER — Ambulatory Visit (HOSPITAL_BASED_OUTPATIENT_CLINIC_OR_DEPARTMENT_OTHER): Payer: Medicare Other | Admitting: Anesthesiology

## 2022-08-12 ENCOUNTER — Ambulatory Visit (HOSPITAL_COMMUNITY)
Admission: RE | Admit: 2022-08-12 | Discharge: 2022-08-12 | Disposition: A | Discharge status: Home / Self Care | Payer: Medicare Other | Attending: Gastroenterology | Admitting: Gastroenterology

## 2022-08-12 DIAGNOSIS — K641 Second degree hemorrhoids: Secondary | ICD-10-CM

## 2022-08-12 DIAGNOSIS — K621 Rectal polyp: Secondary | ICD-10-CM | POA: Diagnosis not present

## 2022-08-12 DIAGNOSIS — K2289 Other specified disease of esophagus: Secondary | ICD-10-CM

## 2022-08-12 DIAGNOSIS — K317 Polyp of stomach and duodenum: Secondary | ICD-10-CM | POA: Insufficient documentation

## 2022-08-12 DIAGNOSIS — D132 Benign neoplasm of duodenum: Secondary | ICD-10-CM | POA: Insufficient documentation

## 2022-08-12 DIAGNOSIS — K2990 Gastroduodenitis, unspecified, without bleeding: Secondary | ICD-10-CM | POA: Diagnosis not present

## 2022-08-12 DIAGNOSIS — K449 Diaphragmatic hernia without obstruction or gangrene: Secondary | ICD-10-CM

## 2022-08-12 DIAGNOSIS — D175 Benign lipomatous neoplasm of intra-abdominal organs: Secondary | ICD-10-CM | POA: Diagnosis not present

## 2022-08-12 DIAGNOSIS — F418 Other specified anxiety disorders: Secondary | ICD-10-CM

## 2022-08-12 DIAGNOSIS — K3189 Other diseases of stomach and duodenum: Secondary | ICD-10-CM | POA: Diagnosis not present

## 2022-08-12 DIAGNOSIS — R933 Abnormal findings on diagnostic imaging of other parts of digestive tract: Secondary | ICD-10-CM

## 2022-08-12 DIAGNOSIS — K5730 Diverticulosis of large intestine without perforation or abscess without bleeding: Secondary | ICD-10-CM | POA: Diagnosis not present

## 2022-08-12 DIAGNOSIS — Z09 Encounter for follow-up examination after completed treatment for conditions other than malignant neoplasm: Secondary | ICD-10-CM | POA: Diagnosis not present

## 2022-08-12 DIAGNOSIS — K635 Polyp of colon: Secondary | ICD-10-CM

## 2022-08-12 DIAGNOSIS — K219 Gastro-esophageal reflux disease without esophagitis: Secondary | ICD-10-CM

## 2022-08-12 DIAGNOSIS — K222 Esophageal obstruction: Secondary | ICD-10-CM

## 2022-08-12 DIAGNOSIS — R1013 Epigastric pain: Secondary | ICD-10-CM

## 2022-08-12 DIAGNOSIS — Z1211 Encounter for screening for malignant neoplasm of colon: Secondary | ICD-10-CM | POA: Insufficient documentation

## 2022-08-12 DIAGNOSIS — Z86010 Personal history of colonic polyps: Secondary | ICD-10-CM | POA: Insufficient documentation

## 2022-08-12 DIAGNOSIS — K208 Other esophagitis without bleeding: Secondary | ICD-10-CM | POA: Diagnosis not present

## 2022-08-12 DIAGNOSIS — R110 Nausea: Secondary | ICD-10-CM

## 2022-08-12 DIAGNOSIS — K2210 Ulcer of esophagus without bleeding: Secondary | ICD-10-CM | POA: Diagnosis not present

## 2022-08-12 DIAGNOSIS — K5790 Diverticulosis of intestine, part unspecified, without perforation or abscess without bleeding: Secondary | ICD-10-CM | POA: Diagnosis not present

## 2022-08-12 DIAGNOSIS — K644 Residual hemorrhoidal skin tags: Secondary | ICD-10-CM | POA: Diagnosis not present

## 2022-08-12 DIAGNOSIS — D128 Benign neoplasm of rectum: Secondary | ICD-10-CM | POA: Diagnosis not present

## 2022-08-12 DIAGNOSIS — D126 Benign neoplasm of colon, unspecified: Secondary | ICD-10-CM | POA: Diagnosis not present

## 2022-08-12 DIAGNOSIS — R6881 Early satiety: Secondary | ICD-10-CM

## 2022-08-12 HISTORY — PX: ENDOSCOPIC MUCOSAL RESECTION: SHX6839

## 2022-08-12 HISTORY — PX: HEMOSTASIS CLIP PLACEMENT: SHX6857

## 2022-08-12 HISTORY — PX: SUBMUCOSAL LIFTING INJECTION: SHX6855

## 2022-08-12 HISTORY — PX: ENTEROSCOPY: SHX5533

## 2022-08-12 HISTORY — PX: POLYPECTOMY: SHX5525

## 2022-08-12 HISTORY — PX: COLONOSCOPY: SHX5424

## 2022-08-12 HISTORY — PX: BIOPSY: SHX5522

## 2022-08-12 HISTORY — PX: HOT HEMOSTASIS: SHX5433

## 2022-08-12 SURGERY — ENTEROSCOPY
Anesthesia: Monitor Anesthesia Care

## 2022-08-12 MED ORDER — SODIUM CHLORIDE 0.9 % IV SOLN: INTRAVENOUS | Status: DC

## 2022-08-12 MED ORDER — SUCRALFATE 1 G PO TABS: 1.0000 g | ORAL_TABLET | Freq: Two times a day (BID) | ORAL | 0 refills | Status: DC

## 2022-08-12 MED ORDER — DEXMEDETOMIDINE HCL IN NACL 80 MCG/20ML IV SOLN
INTRAVENOUS | Status: DC | PRN
  Administered 2022-08-12: 8 ug via INTRAVENOUS

## 2022-08-12 MED ORDER — PROPOFOL 1000 MG/100ML IV EMUL
INTRAVENOUS | Status: AC
  Filled 2022-08-12: qty 100

## 2022-08-12 MED ORDER — PANTOPRAZOLE SODIUM 40 MG PO TBEC
40.0000 mg | DELAYED_RELEASE_TABLET | Freq: Two times a day (BID) | ORAL | 12 refills | Status: DC
Start: 2022-08-12 — End: 2023-03-10

## 2022-08-12 MED ORDER — DEXMEDETOMIDINE HCL IN NACL 80 MCG/20ML IV SOLN
INTRAVENOUS | Status: AC
  Filled 2022-08-12: qty 20

## 2022-08-12 MED ORDER — LACTATED RINGERS IV SOLN
INTRAVENOUS | Status: AC | PRN
  Administered 2022-08-12: 1000 mL via INTRAVENOUS

## 2022-08-12 MED ORDER — PROPOFOL 500 MG/50ML IV EMUL
INTRAVENOUS | Status: DC | PRN
  Administered 2022-08-12: 100 ug/kg/min via INTRAVENOUS
  Administered 2022-08-12: 40 mg via INTRAVENOUS

## 2022-08-12 MED ORDER — EPHEDRINE SULFATE-NACL 50-0.9 MG/10ML-% IV SOSY
PREFILLED_SYRINGE | INTRAVENOUS | Status: DC | PRN
  Administered 2022-08-12 (×2): 5 mg via INTRAVENOUS

## 2022-08-12 NOTE — Op Note (Addendum)
[facility] Patient Name: [name] Procedure Date: 08/12/2022 MRN: [id number] Attending MD: Corliss Parish , MD, [id number] Date of Birth: 1955/06/27 CSN: [id number] Age: 67 Admit Type: Outpatient Procedure:                Small bowel enteroscopy Indications:              Heartburn, Follow-up of polyps in the duodenum Providers:                Corliss Parish, MD, Zoe Lan, RN, Harrington Challenger, Technician Referring MD:             Katrinka Blazing Medicines:                Monitored Anesthesia Care Complications:            No immediate complications. Estimated Blood Loss:     Estimated blood loss was minimal. Estimated blood                            loss was minimal. Procedure:                Pre-Anesthesia Assessment:                           - Prior to the procedure, a History and Physical                            was performed, and patient medications and                            allergies were reviewed. The patient's tolerance of                            previous anesthesia was also reviewed. The risks                            and benefits of the procedure and the sedation                            options and risks were discussed with the patient.                            All questions were answered, and informed consent                            was obtained. Prior Anticoagulants: The patient has                            taken no anticoagulant or antiplatelet agents. ASA                            Grade Assessment: III - A patient with severe  systemic disease. After reviewing the risks and                            benefits, the patient was deemed in satisfactory                            condition to undergo the procedure.                           After obtaining informed consent, the endoscope was                            passed under direct vision. Throughout the                             procedure, the patient's blood pressure, pulse, and                            oxygen saturations were monitored continuously. The                            PCF-HQ190L ([id number] Olympus colonoscope was                            introduced through the mouth and advanced to the                            proximal jejunum. The small bowel enteroscopy was                            accomplished without difficulty. The patient                            tolerated the procedure. After obtaining informed                            consent, the endoscope was passed under direct                            vision. Throughout the procedure, the patient's                            blood pressure, pulse, and oxygen saturations were                            monitored continuously. Scope In: Scope Out: Findings:      No gross lesions were noted in the proximal esophagus and in the mid       esophagus.      LA Grade C (one or more mucosal breaks continuous between tops of 2 or       more mucosal folds, less than 75% circumference) esophagitis with no       bleeding was found in the distal esophagus.      A non-obstructing Schatzki ring was found at the gastroesophageal  junction.      The Z-line was irregular and was found 38 cm from the incisors.      A 2 cm hiatal hernia was present.      Patchy mildly erythematous mucosa without bleeding was found in the       entire examined stomach. This was biopsied with a cold forceps for       histology and Helicobacter pylori testing.      A single large scar was found in the second/third portion of the       duodenum with evidence of previously placed tattoo proximally. There was       evidence of what appeared to be a 14 mm area of potential recurrence of       the tubulovillous adenoma. Preparations were made for further attempt at       mucosal resection. Demarcation of the lesion was performed with       high-definition  white light and narrow band imaging to clearly identify       the boundaries of the lesion. EverLift was injected to raise the lesion       with approximately 75% of it lifting. Piecemeal mucosal resection using       a snare was performed. Resection and retrieval were complete. Resected       tissue margins were examined and clear of polyp tissue. Fulguration to       ablate the lesion margin/base by argon plasma (small bowel settings) was       successful. To prevent bleeding after mucosal resection, two hemostatic       clips were successfully placed (MR conditional). Clip manufacturer:       [employer]. There was no bleeding at the end of the procedure.      Normal mucosa was found in the rest of the visualized duodenum.      Normal mucosa was found in the visualized proximal jejunum. Impression:               - No gross lesions in the proximal esophagus and in                            the mid esophagus.                           - LA Grade C erosive esophagitis with no bleeding                            found distally.                           - Non-obstructing Schatzki ring.                           - Z-line irregular, 38 cm from the incisors.                           - 2 cm hiatal hernia.                           - Erythematous mucosa in the stomach. Biopsied.                           -  Duodenal scar in D2/D3 from previous TVA                            resection. Small evidence of possible recurrence.                            Mucosal resection performed. Ablation performed                            with APC. Clips placed.                           - Normal mucosa was found in the rest of the                            examined duodenum.                           - Normal mucosa was found in the visualized                            proximal jejunum. Moderate Sedation:      Not Applicable - Patient had care per Anesthesia. Recommendation:           - The patient  will be observed post-procedure,                            until all discharge criteria are met.                           - Discharge patient to home.                           - Patient has a contact number available for                            emergencies. The signs and symptoms of potential                            delayed complications were discussed with the                            patient. Return to normal activities tomorrow.                            Written discharge instructions were provided to the                            patient.                           - Full liquid diet today and advance into tomorrow.                           - PPI 40 mg twice daily for next 3 to 4 months.                           -  Carafate twice daily for 1 month.                           - Repeat EGD with primary GI can be considered to                            follow-up esophagitis in 3 to 4 months. Otherwise                            if patient is doing well may not need that.                           - Await pathology results.                           - Pending overall final pathology, consider repeat                            endoscopy/enteroscopy in 1 year to reevaluate the                            site of resection for possible recurrence.                           - The findings and recommendations were discussed                            with the patient.                           - The findings and recommendations were discussed                            with the designated responsible adult. Procedure Code(s):        --- Professional ---                           [phone], 59, Small intestinal endoscopy, enteroscopy                            beyond second portion of duodenum, not including                            ileum; with ablation of tumor(s), polyp(s), or                            other lesion(s) not amenable to removal by hot                            biopsy  forceps, bipolar cautery or snare technique                           44364, Small intestinal endoscopy, enteroscopy  beyond second portion of duodenum, not including                            ileum; with removal of tumor(s), polyp(s), or other                            lesion(s) by snare technique                           44361, Small intestinal endoscopy, enteroscopy                            beyond second portion of duodenum, not including                            ileum; with biopsy, single or multiple Diagnosis Code(s):        --- Professional ---                           K20.80, Other esophagitis without bleeding                           K22.2, Esophageal obstruction                           K22.89, Other specified disease of esophagus                           K44.9, Diaphragmatic hernia without obstruction or                            gangrene                           K31.89, Other diseases of stomach and duodenum                           R12, Heartburn                           K31.7, Polyp of stomach and duodenum CPT copyright 2022 American [employer] Association. All rights reserved. The codes documented in this report are preliminary and upon coder review may  be revised to meet current compliance requirements. Corliss Parish, MD 08/12/2022 9:08:58 AM Number of Addenda: 0

## 2022-08-12 NOTE — Discharge Instructions (Signed)
YOU HAD AN ENDOSCOPIC PROCEDURE TODAY: Refer to the procedure report and other information in the discharge instructions given to you for any specific questions about what was found during the examination. If this information does not answer your questions, please call [place] office at [phone] to clarify.  ° °YOU SHOULD EXPECT: Some feelings of bloating in the abdomen. Passage of more gas than usual. Walking can help get rid of the air that was put into your GI tract during the procedure and reduce the bloating. If you had a lower endoscopy (such as a colonoscopy or flexible sigmoidoscopy) you may notice spotting of blood in your stool or on the toilet paper. Some abdominal soreness may be present for a day or two, also. ° °DIET: Your first meal following the procedure should be a light meal and then it is ok to progress to your normal diet. A half-sandwich or bowl of soup is an example of a good first meal. Heavy or fried foods are harder to digest and may make you feel nauseous or bloated. Drink plenty of fluids but you should avoid alcoholic beverages for 24 hours. If you had a esophageal dilation, please see attached instructions for diet.   ° °ACTIVITY: Your care partner should take you home directly after the procedure. You should plan to take it easy, moving slowly for the rest of the day. You can resume normal activity the day after the procedure however YOU SHOULD NOT DRIVE, use power tools, machinery or perform tasks that involve climbing or major physical exertion for 24 hours (because of the sedation medicines used during the test).  ° °SYMPTOMS TO REPORT IMMEDIATELY: °A gastroenterologist can be reached at any hour. Please call [phone]  for any of the following symptoms:  °Following lower endoscopy (colonoscopy, flexible sigmoidoscopy) °Excessive amounts of blood in the stool  °Significant tenderness, worsening of abdominal pains  °Swelling of the abdomen that is new, acute  °Fever of 100° or  higher  °Following upper endoscopy (EGD, EUS, ERCP, esophageal dilation) °Vomiting of blood or coffee ground material  °New, significant abdominal pain  °New, significant chest pain or pain under the shoulder blades  °Painful or persistently difficult swallowing  °New shortness of breath  °Black, tarry-looking or red, bloody stools ° °FOLLOW UP:  °If any biopsies were taken you will be contacted by phone or by letter within the next 1-3 weeks. Call [phone]  if you have not heard about the biopsies in 3 weeks.  °Please also call with any specific questions about appointments or follow up tests. ° °

## 2022-08-12 NOTE — Anesthesia Postprocedure Evaluation (Signed)
Anesthesia Post Note  Patient: [name]  Procedure(s) Performed: ENTEROSCOPY COLONOSCOPY POLYPECTOMY BIOPSY SUBMUCOSAL LIFTING INJECTION ENDOSCOPIC MUCOSAL RESECTION HOT HEMOSTASIS (ARGON PLASMA COAGULATION/BICAP) HEMOSTASIS CLIP PLACEMENT     Patient location during evaluation: Nursing Unit Anesthesia Type: MAC Level of consciousness: oriented and awake and alert Pain management: pain level controlled Vital Signs Assessment: post-procedure vital signs reviewed and stable Respiratory status: spontaneous breathing and respiratory function stable Cardiovascular status: blood pressure returned to baseline and stable Postop Assessment: no headache, no backache, no apparent nausea or vomiting and patient able to bend at knees Anesthetic complications: no   No notable events documented.  Last Vitals:  Vitals:   08/12/22 0905 08/12/22 0910  BP: 111/75 107/74  Pulse: 78 69  Resp: 14 19  Temp:    SpO2: 99% 93%    Last Pain:  Vitals:   08/12/22 0910  TempSrc:   PainSc: 0-No pain                 Trevor Iha

## 2022-08-12 NOTE — H&P (Signed)
GASTROENTEROLOGY PROCEDURE H&P NOTE   Primary Care Physician: Billie Lade, MD  HPI: [name] is a 67 y.o. 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  Past Medical History:  Diagnosis Date   Anxiety    Anxiety state 01/01/2018   Bipolar 1 disorder (HCC)    Bipolar disorder (HCC) 12/12/2013   Depression    Diverticulitis    Diverticulitis of colon 03/15/2016   Hyperlipidemia 06/12/2020   Insomnia 01/01/2018   Tobacco abuse 06/25/2016   Tubular adenoma 10/02/2018   Past Surgical History:  Procedure Laterality Date   BIOPSY  05/15/2021   Procedure: BIOPSY;  Surgeon: Dolores Frame, MD;  Location: [facility];  Service: Gastroenterology;;   BIOPSY  08/24/2021   Procedure: BIOPSY;  Surgeon: Lemar Lofty., MD;  Location: [facility];  Service: Gastroenterology;;   COLONOSCOPY     COLONOSCOPY N/A 08/08/2019   Procedure: COLONOSCOPY;  Surgeon: Malissa Hippo, MD;  Location: [facility];  Service: Endoscopy;  Laterality: N/A;  220   COLONOSCOPY WITH PROPOFOL N/A 05/15/2021   large ulcer at IC valve with negative biopsies for dysplasia. two tubular adenomas. Recommending surveillance in 6 months to assess ulcer healing. Patient prefers to undergo this at time of EGD for history of duodenal adenoma.   ENDOSCOPIC MUCOSAL RESECTION  08/24/2021   Dr. Meridee Score: duodenal polyp s/p piecemeal removal, path tubulovillous adenoma. Repeat in 9-12 months.   ESOPHAGOGASTRODUODENOSCOPY (EGD) WITH PROPOFOL N/A 05/15/2021   negative H.pylori, duodenal adenoma. Referred for EMR.   ESOPHAGOGASTRODUODENOSCOPY (EGD) WITH PROPOFOL N/A 08/24/2021   Procedure: ESOPHAGOGASTRODUODENOSCOPY (EGD) WITH PROPOFOL;  Surgeon: Meridee Score Netty Starring., MD;  Location: [facility];  Service: Gastroenterology;   Laterality: N/A;   HEMOSTASIS CLIP PLACEMENT  08/24/2021   Procedure: HEMOSTASIS CLIP PLACEMENT;  Surgeon: Lemar Lofty., MD;  Location: [facility];  Service: Gastroenterology;;   HOT HEMOSTASIS N/A 08/24/2021   Procedure: HOT HEMOSTASIS (ARGON PLASMA COAGULATION/BICAP);  Surgeon: Lemar Lofty., MD;  Location: [facility];  Service: Gastroenterology;  Laterality: N/A;   oophrorectomy     POLYPECTOMY  08/08/2019   Procedure: POLYPECTOMY;  Surgeon: Malissa Hippo, MD;  Location: [facility];  Service: Endoscopy;;   POLYPECTOMY  05/15/2021   Procedure: POLYPECTOMY;  Surgeon: Dolores Frame, MD;  Location: [facility];  Service: Gastroenterology;;   SUBMUCOSAL LIFTING INJECTION  08/24/2021   Procedure: SUBMUCOSAL LIFTING INJECTION;  Surgeon: Lemar Lofty., MD;  Location: [facility];  Service: Gastroenterology;;   SUBMUCOSAL TATTOO INJECTION  08/24/2021   Procedure: SUBMUCOSAL TATTOO INJECTION;  Surgeon: Lemar Lofty., MD;  Location: [facility];  Service: Gastroenterology;;   TUBAL LIGATION     Current Facility-Administered Medications  Medication Dose Route Frequency Provider Last Rate Last Admin   0.9 %  sodium chloride infusion   Intravenous Continuous Mansouraty, Netty Starring., MD       lactated ringers infusion    Continuous PRN Mansouraty, Netty Starring., MD 10 mL/hr at 08/12/22 0659 1,000 mL at 08/12/22 0659    Current Facility-Administered Medications:    0.9 %  sodium chloride infusion, , Intravenous, Continuous, Mansouraty, Netty Starring., MD   lactated ringers infusion, , , Continuous PRN, Mansouraty, Netty Starring., MD, Last Rate: 10 mL/hr at 08/12/22 0659, 1,000 mL at 08/12/22 0659 Allergies  Allergen Reactions   Meperidine Hives  5-Alpha Reductase Inhibitors Other (See Comments)    unknown   Family History  Problem Relation Age of Onset   Cancer Mother        lichen planus - oral cancer   Cancer Father        lung    Cancer Maternal Uncle        uterine   Diabetes Maternal Uncle    Stroke Maternal Grandmother    COPD Paternal Grandfather    Colon cancer Neg Hx    Esophageal cancer Neg Hx    Stomach cancer Neg Hx    Liver cancer Neg Hx    Pancreatic cancer Neg Hx    Social History   Socioeconomic History   Marital status: Divorced    Spouse name: Not on file   Number of children: 2   Years of education: 13   Highest education level: Not on file  Occupational History   Occupation: Manufacturing engineer    Comment: [employer], [place]  Tobacco Use   Smoking status: Former    Types: Cigarettes    Quit date: 08/19/2021    Years since quitting: 0.9   Smokeless tobacco: Never  Vaping Use   Vaping Use: Never used  Substance and Sexual Activity   Alcohol use: Yes    Alcohol/week: 2.0 standard drinks of alcohol    Types: 2 Glasses of wine per week    Comment: socially   Drug use: No   Sexual activity: Not Currently    Birth control/protection: Post-menopausal  Other Topics Concern   Not on file  Social History Narrative   Lives alone   She has 2 sons that are close by.      She enjoys going out to dinner and spend time with family.      Diet reports eating all foods.   Caffeine none   Water 6 to 8 cups regularly daily.      Social Determinants of Health   Financial Resource Strain: Not on file  Food Insecurity: Not on file  Transportation Needs: Not on file  Physical Activity: Not on file  Stress: Not on file  Social Connections: Not on file  Intimate Partner Violence: Not on file    Physical Exam: Today's Vitals   08/05/22 1638 08/12/22 0635  Pulse:  70  Resp:  10  Temp:  (!) 97.3 F (36.3 C)  TempSrc:  Tympanic  Weight: 71.2 kg    Body mass index is 24.59 kg/m. GEN: NAD EYE: Sclerae anicteric ENT: MMM CV: Non-tachycardic GI: Soft, NT/ND NEURO:  Alert & Oriented x 3  Lab Results: No results for input(s): "WBC", "HGB", "HCT", "PLT" in the last 72  hours. BMET No results for input(s): "NA", "K", "CL", "CO2", "GLUCOSE", "BUN", "CREATININE", "CALCIUM" in the last 72 hours. LFT No results for input(s): "PROT", "ALBUMIN", "AST", "ALT", "ALKPHOS", "BILITOT", "BILIDIR", "IBILI" in the last 72 hours. PT/INR No results for input(s): "LABPROT", "INR" in the last 72 hours.   Impression / Plan: This is a 67 y.o.female who presents for EGD/Enteroscopy + Colonoscopy to follow-up a previously removed tubulovillous adenoma of the duodenum as well as a history of previous IC valve ulceration and colon polyps for follow-up.  The risks and benefits of endoscopic evaluation/treatment were discussed with the patient and/or family; these include but are not limited to the risk of perforation, infection, bleeding, missed lesions, lack of diagnosis, severe illness requiring hospitalization, as well as anesthesia and sedation related illnesses.  The patient's  history has been reviewed, patient examined, no change in status, and deemed stable for procedure.  The patient and/or family is agreeable to proceed.    Corliss Parish, MD [facility] Office # [phone]

## 2022-08-12 NOTE — Op Note (Signed)
[facility] Patient Name: [name] Procedure Date: 08/12/2022 MRN: [id number] Attending MD: Corliss Parish , MD, [id number] Date of Birth: 08/03/1955 CSN: [id number] Age: 67 Admit Type: Outpatient Procedure:                Colonoscopy Indications:              High risk colon cancer surveillance: Personal                            history of colonic polyps, Previous ileocecal valve                            ulcer (follow-up) Providers:                Corliss Parish, MD, Zoe Lan, RN, Harrington Challenger, Technician Referring MD:              Medicines:                Monitored Anesthesia Care Complications:            No immediate complications. Estimated Blood Loss:     Estimated blood loss was minimal. Procedure:                Pre-Anesthesia Assessment:                           - Prior to the procedure, a History and Physical                            was performed, and patient medications and                            allergies were reviewed. The patient's tolerance of                            previous anesthesia was also reviewed. The risks                            and benefits of the procedure and the sedation                            options and risks were discussed with the patient.                            All questions were answered, and informed consent                            was obtained. Prior Anticoagulants: The patient has                            taken no anticoagulant or antiplatelet agents. ASA                            Grade Assessment: III -  A patient with severe                            systemic disease. After reviewing the risks and                            benefits, the patient was deemed in satisfactory                            condition to undergo the procedure.                           After obtaining informed consent, the colonoscope                            was passed under  direct vision. Throughout the                            procedure, the patient's blood pressure, pulse, and                            oxygen saturations were monitored continuously. The                            CF-HQ190L ([id number] Olympus colonoscope was                            introduced through the anus and advanced to the 3                            cm into the ileum. The colonoscopy was performed                            without difficulty. The patient tolerated the                            procedure. The quality of the bowel preparation was                            adequate. The terminal ileum, ileocecal valve,                            appendiceal orifice, and rectum were photographed. Scope In: 7:52:04 AM Scope Out: 8:07:59 AM Scope Withdrawal Time: 0 hours 11 minutes 31 seconds  Total Procedure Duration: 0 hours 15 minutes 55 seconds  Findings:      Skin tags were found on perianal exam.      The digital rectal exam findings include hemorrhoids. Pertinent       negatives include no palpable rectal lesions.      The terminal ileum appeared normal.      The ileocecal valve appeared normal.      A 3 mm polyp was found in the mid rectum. The polyp was sessile. The       polyp was removed with a cold snare. Resection and retrieval were  complete.      Many medium-mouthed and small-mouthed diverticula were found in the       entire colon.      Normal mucosa was found in the entire colon otherwise.      Non-bleeding non-thrombosed external and internal hemorrhoids were found       during retroflexion, during perianal exam and during digital exam. The       hemorrhoids were Grade II (internal hemorrhoids that prolapse but reduce       spontaneously). Impression:               - Perianal skin tags found on perianal exam.                           - Hemorrhoids found on digital rectal exam.                           - The examined portion of the terminal ileum was                             normal.                           - The examined portion of the ileocecal valve was                            normal.                           - One 3 mm polyp in the mid rectum, removed with a                            cold snare. Resected and retrieved.                           - Diverticulosis in the entire examined colon.                           - Normal mucosa in the entire examined colon                            otherwise.                           - Non-bleeding non-thrombosed external and internal                            hemorrhoids. Moderate Sedation:      Not Applicable - Patient had care per Anesthesia. Recommendation:           - The patient will be observed post-procedure,                            until all discharge criteria are met.                           - Proceed to scheduled EGD/SBE.                           -  High fiber diet.                           - Use FiberCon 1-2 tablets PO daily.                           - Continue present medications.                           - Await pathology results.                           - Repeat colonoscopy in 3/5/7 years for                            surveillance based on pathology results (this would                            include the previously resected 2 adenomatous                            polyps in 2023, if this polyp returns adenomatous                            then 3-year otherwise 5/7 may make sense -                            ultimately deferred to patient's primary GI).                           - The findings and recommendations were discussed                            with the patient.                           - The findings and recommendations were discussed                            with the designated responsible adult. Procedure Code(s):        --- Professional ---                           [id number], Colonoscopy, flexible; with removal of                             tumor(s), polyp(s), or other lesion(s) by snare                            technique Diagnosis Code(s):        --- Professional ---                           Z86.010, Personal history of colonic polyps  K64.1, Second degree hemorrhoids                           D12.8, Benign neoplasm of rectum                           K64.4, Residual hemorrhoidal skin tags                           K57.30, Diverticulosis of large intestine without                            perforation or abscess without bleeding CPT copyright 2022 [employer]. All rights reserved. The codes documented in this report are preliminary and upon coder review may  be revised to meet current compliance requirements. Corliss Parish, MD 08/12/2022 8:54:16 AM Number of Addenda: 0

## 2022-08-12 NOTE — Transfer of Care (Signed)
Immediate Anesthesia Transfer of Care Note  Patient: [name]  Procedure(s) Performed: Procedure(s): ENTEROSCOPY (N/A) COLONOSCOPY (N/A) POLYPECTOMY BIOPSY SUBMUCOSAL LIFTING INJECTION ENDOSCOPIC MUCOSAL RESECTION HOT HEMOSTASIS (ARGON PLASMA COAGULATION/BICAP) (N/A) HEMOSTASIS CLIP PLACEMENT  Patient Location: PACU and Endoscopy Unit  Anesthesia Type:MAC  Level of Consciousness: awake, alert  and oriented  Airway & Oxygen Therapy: Patient Spontanous Breathing and Patient connected to nasal cannula oxygen  Post-op Assessment: Report given to RN and Post -op Vital signs reviewed and stable  Post vital signs: Reviewed and stable  Last Vitals:  Vitals:   08/12/22 0635  Pulse: 70  Resp: 10  Temp: (!) 36.3 C    Complications: No apparent anesthesia complications

## 2022-08-13 ENCOUNTER — Encounter: Payer: Self-pay | Admitting: Gastroenterology

## 2022-08-13 LAB — SURGICAL PATHOLOGY

## 2022-08-17 ENCOUNTER — Encounter (HOSPITAL_COMMUNITY): Payer: Self-pay | Admitting: Gastroenterology

## 2022-08-19 ENCOUNTER — Other Ambulatory Visit: Payer: Self-pay | Admitting: Physician Assistant

## 2022-08-20 DIAGNOSIS — N62 Hypertrophy of breast: Secondary | ICD-10-CM | POA: Diagnosis not present

## 2022-08-20 NOTE — Progress Notes (Signed)
7 yr TCS noted in recall  

## 2022-08-20 NOTE — Telephone Encounter (Signed)
Lvm for pt to call back and schedule.  

## 2022-08-20 NOTE — Telephone Encounter (Signed)
Please call to schedule an appt, not due until July

## 2022-08-23 ENCOUNTER — Telehealth: Payer: Self-pay | Admitting: Gastroenterology

## 2022-08-23 NOTE — Telephone Encounter (Signed)
The pt has not received the letter with path results.  I have discussed with her on the phone and sent her both to My Chart.

## 2022-08-23 NOTE — Telephone Encounter (Signed)
Inbound call from patient requesting a call back to discuss recent enteroscopy results. Please advise, thank you.

## 2022-08-25 DIAGNOSIS — L814 Other melanin hyperpigmentation: Secondary | ICD-10-CM | POA: Diagnosis not present

## 2022-08-25 DIAGNOSIS — L821 Other seborrheic keratosis: Secondary | ICD-10-CM | POA: Diagnosis not present

## 2022-08-25 DIAGNOSIS — D0472 Carcinoma in situ of skin of left lower limb, including hip: Secondary | ICD-10-CM | POA: Diagnosis not present

## 2022-08-25 DIAGNOSIS — D485 Neoplasm of uncertain behavior of skin: Secondary | ICD-10-CM | POA: Diagnosis not present

## 2022-08-25 DIAGNOSIS — L570 Actinic keratosis: Secondary | ICD-10-CM | POA: Diagnosis not present

## 2022-08-25 DIAGNOSIS — I8392 Asymptomatic varicose veins of left lower extremity: Secondary | ICD-10-CM | POA: Diagnosis not present

## 2022-08-25 DIAGNOSIS — I8391 Asymptomatic varicose veins of right lower extremity: Secondary | ICD-10-CM | POA: Diagnosis not present

## 2022-08-25 DIAGNOSIS — C44722 Squamous cell carcinoma of skin of right lower limb, including hip: Secondary | ICD-10-CM | POA: Diagnosis not present

## 2022-08-25 DIAGNOSIS — D1801 Hemangioma of skin and subcutaneous tissue: Secondary | ICD-10-CM | POA: Diagnosis not present

## 2022-09-02 ENCOUNTER — Ambulatory Visit (INDEPENDENT_AMBULATORY_CARE_PROVIDER_SITE_OTHER): Payer: 59 | Admitting: Gastroenterology

## 2022-09-06 DIAGNOSIS — C44722 Squamous cell carcinoma of skin of right lower limb, including hip: Secondary | ICD-10-CM | POA: Diagnosis not present

## 2022-09-06 DIAGNOSIS — L570 Actinic keratosis: Secondary | ICD-10-CM | POA: Diagnosis not present

## 2022-09-10 DIAGNOSIS — M9902 Segmental and somatic dysfunction of thoracic region: Secondary | ICD-10-CM | POA: Diagnosis not present

## 2022-09-10 DIAGNOSIS — M62830 Muscle spasm of back: Secondary | ICD-10-CM | POA: Diagnosis not present

## 2022-09-10 DIAGNOSIS — M9903 Segmental and somatic dysfunction of lumbar region: Secondary | ICD-10-CM | POA: Diagnosis not present

## 2022-09-10 DIAGNOSIS — M9905 Segmental and somatic dysfunction of pelvic region: Secondary | ICD-10-CM | POA: Diagnosis not present

## 2022-09-14 ENCOUNTER — Encounter: Payer: Self-pay | Admitting: Physician Assistant

## 2022-09-14 ENCOUNTER — Ambulatory Visit (INDEPENDENT_AMBULATORY_CARE_PROVIDER_SITE_OTHER): Payer: Medicare Other | Admitting: Physician Assistant

## 2022-09-14 DIAGNOSIS — F319 Bipolar disorder, unspecified: Secondary | ICD-10-CM

## 2022-09-14 DIAGNOSIS — G4700 Insomnia, unspecified: Secondary | ICD-10-CM

## 2022-09-14 DIAGNOSIS — F411 Generalized anxiety disorder: Secondary | ICD-10-CM | POA: Diagnosis not present

## 2022-09-14 MED ORDER — BUSPIRONE HCL 15 MG PO TABS: ORAL_TABLET | ORAL | 3 refills | Status: DC

## 2022-09-14 MED ORDER — TRAZODONE HCL 100 MG PO TABS: 200.0000 mg | ORAL_TABLET | Freq: Every evening | ORAL | 3 refills | Status: DC | PRN

## 2022-09-14 MED ORDER — ALPRAZOLAM 0.5 MG PO TABS: ORAL_TABLET | ORAL | 5 refills | Status: DC

## 2022-09-14 MED ORDER — LAMOTRIGINE 200 MG PO TABS: ORAL_TABLET | ORAL | 3 refills | Status: DC

## 2022-09-14 NOTE — Progress Notes (Signed)
[facility]  Patient ID: [name],  MRN: [id number]  PCP: Billie Lade, MD  Date of Evaluation: 09/14/2022 Time spent:20 minutes  Chief Complaint:  Chief Complaint   Anxiety; Depression; Insomnia; Follow-up    HISTORY/CURRENT STATUS: HPI  For routine med check.  She's doing well, feels like her meds are working fine. Patient is able to enjoy things.  Energy and motivation are good.  Not working now.  No extreme sadness, tearfulness, or feelings of hopelessness.  Sleeps well most of the time.  Uses Traz at bedtime. ADLs and personal hygiene are normal.   Denies any changes in concentration, making decisions, or remembering things.  Appetite has not changed.  Weight is stable.  Denies suicidal or homicidal thoughts.  Anxiety is well-cntrolled.  Needs Xanax daily, most of the time tid but sometimes she can get away w/ bid. Will get panicky sometimes if she doesn't take it.   Patient denies increased energy with decreased need for sleep, increased talkativeness, racing thoughts, impulsivity or risky behaviors, increased spending, increased libido, grandiosity, increased irritability or anger, paranoia, or hallucinations.  Denies dizziness, syncope, seizures, numbness, tremor, tingling, tics, unsteady gait, slurred speech, confusion. Denies muscle or joint pain, stiffness, or dystonia. Denies unexplained weight loss, frequent infections, or sores that heal slowly.  No polyphagia, polydipsia, or polyuria. Denies visual changes or paresthesias.   Individual Medical History/ Review of Systems: Changes? :Yes   had GI surgery, polyp removed, recovered well  Past medications for mental health diagnoses include: Effexor, Ativan, Paxil, Lithobid, Xanax, Lamictal, trazodone, Saphris, Zoloft, Seroquel, BuSpar Wellbutrin caused tremor  Allergies: Meperidine and 5-alpha reductase inhibitors  Current Medications:  Current Outpatient Medications:    acetaminophen (TYLENOL)  500 MG tablet, Take 500 mg by mouth every 6 (six) hours as needed for mild pain or moderate pain., Disp: , Rfl:    dicyclomine (BENTYL) 20 MG tablet, TAKE 1 TABLET(20 MG) BY MOUTH THREE TIMES DAILY AS NEEDED FOR ABDOMINAL PAIN, Disp: 30 tablet, Rfl: 1   pantoprazole (PROTONIX) 40 MG tablet, Take 1 tablet (40 mg total) by mouth 2 (two) times daily before a meal., Disp: 60 tablet, Rfl: 12   sucralfate (CARAFATE) 1 g tablet, Take 1 tablet (1 g total) by mouth 2 (two) times daily., Disp: 60 tablet, Rfl: 0   ALPRAZolam (XANAX) 0.5 MG tablet, TAKE 1 TABLET(0.5 MG) BY MOUTH THREE TIMES DAILY AS NEEDED FOR ANXIETY, Disp: 90 tablet, Rfl: 5   busPIRone (BUSPAR) 15 MG tablet, TAKE 1 TABLET(15 MG) BY MOUTH TWICE DAILY, Disp: 180 tablet, Rfl: 3   lamoTRIgine (LAMICTAL) 200 MG tablet, TAKE 1 TABLET(200 MG) BY MOUTH TWICE DAILY, Disp: 180 tablet, Rfl: 3   traZODone (DESYREL) 100 MG tablet, Take 2 tablets (200 mg total) by mouth at bedtime as needed., Disp: 180 tablet, Rfl: 3 Medication Side Effects: none  Family Medical/ Social History: Changes? No  MENTAL HEALTH EXAM:  There were no vitals taken for this visit.There is no height or weight on file to calculate BMI.  General Appearance: Casual, Neat and Well Groomed  Eye Contact:  Good  Speech:  Clear and Coherent and Normal Rate  Volume:  Normal  Mood:  Euthymic  Affect:  Appropriate  Thought Process:  Goal Directed and Descriptions of Associations: Circumstantial  Orientation:  Full (Time, Place, and Person)  Thought Content: Logical   Suicidal Thoughts:  No  Homicidal Thoughts:  No  Memory:  WNL  Judgement:  Good  Insight:  Good  Psychomotor Activity:  Normal  Concentration:  Concentration: Good and Attention Span: Good  Recall:  Good  Fund of Knowledge: Good  Language: Good  Assets:  Communication Skills Desire for Improvement Financial Resources/Insurance Housing Transportation Vocational/Educational  ADL's:  Intact  Cognition: WNL   Prognosis:  Good   DIAGNOSES:    ICD-10-CM   1. Bipolar I disorder (HCC)  F31.9     2. Generalized anxiety disorder  F41.1     3. Insomnia, unspecified type  G47.00       Receiving Psychotherapy: No   RECOMMENDATIONS:  PDMP was reviewed.  Last Xanax filled 07/29/2022. I provided 20  minutes of face to face time during this encounter, including time spent before and after the visit in records review, medical decision making, counseling pertinent to today's visit, and charting.   She is doing well so no changes in meds are necessary.  Continue Xanax 0.5 mg, 1 p.o. 3 times daily as needed anxiety. Continue BuSpar 15 mg, 1 p.o. twice daily. Continue Lamictal 100 mg, 2 p.o. twice daily. Continue trazodone 100 mg, 1-2 nightly as needed sleep. Return in 6 months.  Melony Overly, PA-C

## 2022-09-15 DIAGNOSIS — M9903 Segmental and somatic dysfunction of lumbar region: Secondary | ICD-10-CM | POA: Diagnosis not present

## 2022-09-15 DIAGNOSIS — M62830 Muscle spasm of back: Secondary | ICD-10-CM | POA: Diagnosis not present

## 2022-09-15 DIAGNOSIS — M9905 Segmental and somatic dysfunction of pelvic region: Secondary | ICD-10-CM | POA: Diagnosis not present

## 2022-09-15 DIAGNOSIS — M9902 Segmental and somatic dysfunction of thoracic region: Secondary | ICD-10-CM | POA: Diagnosis not present

## 2022-09-23 DIAGNOSIS — N62 Hypertrophy of breast: Secondary | ICD-10-CM | POA: Diagnosis not present

## 2022-09-23 DIAGNOSIS — G8929 Other chronic pain: Secondary | ICD-10-CM | POA: Diagnosis not present

## 2022-09-23 DIAGNOSIS — M542 Cervicalgia: Secondary | ICD-10-CM | POA: Diagnosis not present

## 2022-09-23 DIAGNOSIS — M549 Dorsalgia, unspecified: Secondary | ICD-10-CM | POA: Diagnosis not present

## 2022-09-23 DIAGNOSIS — L304 Erythema intertrigo: Secondary | ICD-10-CM | POA: Diagnosis not present

## 2022-10-07 ENCOUNTER — Telehealth: Payer: Self-pay | Admitting: Internal Medicine

## 2022-10-07 NOTE — Telephone Encounter (Signed)
Patient called asked does she need to have blood work done if so needs an order and need to call patient to let her know either way.

## 2022-10-13 ENCOUNTER — Ambulatory Visit (INDEPENDENT_AMBULATORY_CARE_PROVIDER_SITE_OTHER): Payer: Medicare Other | Admitting: Family Medicine

## 2022-10-13 ENCOUNTER — Encounter: Payer: Self-pay | Admitting: Family Medicine

## 2022-10-13 VITALS — BP 99/65 | HR 89 | Ht 67.0 in | Wt 167.0 lb

## 2022-10-13 DIAGNOSIS — R002 Palpitations: Secondary | ICD-10-CM

## 2022-10-13 NOTE — Patient Instructions (Signed)
        Great to see you today.  I have refilled the medication(s) we provide.    - Please take medications as prescribed. - Follow up with your primary health provider if any health concerns arises. - If symptoms worsen please contact your primary care provider and/or visit the emergency department.  

## 2022-10-13 NOTE — Progress Notes (Addendum)
Patient Office Visit   Subjective   Patient ID: [name], female    DOB: 1955-06-23  Age: 67 y.o. MRN: [id number]  CC:  Chief Complaint  Patient presents with   Palpitations    Patient complains of fast heart rate and palpitations intermittently with activity sometimes, other times not. States she has been feeling more anxious lately and drinking more caffeine than usual.     HPI [name] 67 year old female, presents to the clinic for palpations started 3 weeks ago.She  has a past medical history of Anxiety, Anxiety state (01/01/2018), Bipolar 1 disorder (HCC), Bipolar disorder (HCC) (12/12/2013), Depression, Diverticulitis, Diverticulitis of colon (03/15/2016), Hyperlipidemia (06/12/2020), Insomnia (01/01/2018), Tobacco abuse (06/25/2016), and Tubular adenoma (10/02/2018).  Palpitations  This is a new problem. The problem has been waxing and waning. On average, each episode lasts 2 minutes. The symptoms are aggravated by exercise, caffeine and stress. Associated symptoms include anxiety and an irregular heartbeat. Pertinent negatives include no chest pain, diaphoresis, dizziness or shortness of breath. She has tried deep relaxation for the symptoms. The treatment provided mild relief. Risk factors include dyslipidemia and stress. Her past medical history is significant for anxiety.      Outpatient Encounter Medications as of 10/13/2022  Medication Sig   acetaminophen (TYLENOL) 500 MG tablet Take 500 mg by mouth every 6 (six) hours as needed for mild pain or moderate pain.   ALPRAZolam (XANAX) 0.5 MG tablet TAKE 1 TABLET(0.5 MG) BY MOUTH THREE TIMES DAILY AS NEEDED FOR ANXIETY   busPIRone (BUSPAR) 15 MG tablet TAKE 1 TABLET(15 MG) BY MOUTH TWICE DAILY   dicyclomine (BENTYL) 20 MG tablet TAKE 1 TABLET(20 MG) BY MOUTH THREE TIMES DAILY AS NEEDED FOR ABDOMINAL PAIN   lamoTRIgine (LAMICTAL) 200 MG tablet TAKE 1 TABLET(200 MG) BY MOUTH TWICE DAILY   pantoprazole (PROTONIX) 40 MG  tablet Take 1 tablet (40 mg total) by mouth 2 (two) times daily before a meal.   sucralfate (CARAFATE) 1 g tablet Take 1 tablet (1 g total) by mouth 2 (two) times daily.   traZODone (DESYREL) 100 MG tablet Take 2 tablets (200 mg total) by mouth at bedtime as needed.   No facility-administered encounter medications on file as of 10/13/2022.    Past Surgical History:  Procedure Laterality Date   BIOPSY  05/15/2021   Procedure: BIOPSY;  Surgeon: Dolores Frame, MD;  Location: [facility];  Service: Gastroenterology;;   BIOPSY  08/24/2021   Procedure: BIOPSY;  Surgeon: Lemar Lofty., MD;  Location: [facility];  Service: Gastroenterology;;   BIOPSY  08/12/2022   Procedure: BIOPSY;  Surgeon: Lemar Lofty., MD;  Location: [facility];  Service: Gastroenterology;;   COLONOSCOPY     COLONOSCOPY N/A 08/08/2019   Procedure: COLONOSCOPY;  Surgeon: Malissa Hippo, MD;  Location: [facility];  Service: Endoscopy;  Laterality: N/A;  220   COLONOSCOPY N/A 08/12/2022   Procedure: COLONOSCOPY;  Surgeon: Meridee Score Netty Starring., MD;  Location: [facility];  Service: Gastroenterology;  Laterality: N/A;   COLONOSCOPY WITH PROPOFOL N/A 05/15/2021   large ulcer at IC valve with negative biopsies for dysplasia. two tubular adenomas. Recommending surveillance in 6 months to assess ulcer healing. Patient prefers to undergo this at time of EGD for history of duodenal adenoma.   ENDOSCOPIC MUCOSAL RESECTION  08/24/2021   Dr. Meridee Score: duodenal polyp s/p piecemeal removal, path tubulovillous adenoma. Repeat in 9-12 months.   ENDOSCOPIC MUCOSAL RESECTION  08/12/2022   Procedure:  ENDOSCOPIC MUCOSAL RESECTION;  Surgeon: Meridee Score Netty Starring., MD;  Location: [facility];  Service: Gastroenterology;;   ENTEROSCOPY N/A 08/12/2022   Procedure: ENTEROSCOPY;  Surgeon: Meridee Score Netty Starring., MD;  Location: [facility];  Service: Gastroenterology;  Laterality: N/A;    ESOPHAGOGASTRODUODENOSCOPY (EGD) WITH PROPOFOL N/A 05/15/2021   negative H.pylori, duodenal adenoma. Referred for EMR.   ESOPHAGOGASTRODUODENOSCOPY (EGD) WITH PROPOFOL N/A 08/24/2021   Procedure: ESOPHAGOGASTRODUODENOSCOPY (EGD) WITH PROPOFOL;  Surgeon: Meridee Score Netty Starring., MD;  Location: [facility];  Service: Gastroenterology;  Laterality: N/A;   HEMOSTASIS CLIP PLACEMENT  08/24/2021   Procedure: HEMOSTASIS CLIP PLACEMENT;  Surgeon: Lemar Lofty., MD;  Location: [facility];  Service: Gastroenterology;;   HEMOSTASIS CLIP PLACEMENT  08/12/2022   Procedure: HEMOSTASIS CLIP PLACEMENT;  Surgeon: Lemar Lofty., MD;  Location: [facility];  Service: Gastroenterology;;   HOT HEMOSTASIS N/A 08/24/2021   Procedure: HOT HEMOSTASIS (ARGON PLASMA COAGULATION/BICAP);  Surgeon: Lemar Lofty., MD;  Location: [facility];  Service: Gastroenterology;  Laterality: N/A;   HOT HEMOSTASIS N/A 08/12/2022   Procedure: HOT HEMOSTASIS (ARGON PLASMA COAGULATION/BICAP);  Surgeon: Lemar Lofty., MD;  Location: [facility];  Service: Gastroenterology;  Laterality: N/A;   oophrorectomy     POLYPECTOMY  08/08/2019   Procedure: POLYPECTOMY;  Surgeon: Malissa Hippo, MD;  Location: [facility];  Service: Endoscopy;;   POLYPECTOMY  05/15/2021   Procedure: POLYPECTOMY;  Surgeon: Dolores Frame, MD;  Location: [facility];  Service: Gastroenterology;;   POLYPECTOMY  08/12/2022   Procedure: POLYPECTOMY;  Surgeon: Lemar Lofty., MD;  Location: [facility];  Service: Gastroenterology;;   SUBMUCOSAL LIFTING INJECTION  08/24/2021   Procedure: SUBMUCOSAL LIFTING INJECTION;  Surgeon: Lemar Lofty., MD;  Location: [facility];  Service: Gastroenterology;;   Sunnie Nielsen LIFTING INJECTION  08/12/2022   Procedure: SUBMUCOSAL LIFTING INJECTION;  Surgeon: Lemar Lofty., MD;  Location: [facility];  Service: Gastroenterology;;   SUBMUCOSAL TATTOO  INJECTION  08/24/2021   Procedure: SUBMUCOSAL TATTOO INJECTION;  Surgeon: Lemar Lofty., MD;  Location: [facility];  Service: Gastroenterology;;   TUBAL LIGATION      Review of Systems  Constitutional:  Negative for diaphoresis.  Eyes:  Negative for blurred vision.  Respiratory:  Negative for shortness of breath.   Cardiovascular:  Positive for palpitations. Negative for chest pain.  Gastrointestinal:  Negative for abdominal pain.  Neurological:  Negative for dizziness.  Psychiatric/Behavioral:  The patient is nervous/anxious.       Objective    BP 99/65   Pulse 89   Ht 5\' 7"  (1.702 m)   Wt 167 lb (75.8 kg)   SpO2 92%   BMI 26.16 kg/m   Physical Exam Vitals reviewed.  Constitutional:      General: She is not in acute distress.    Appearance: Normal appearance. She is not ill-appearing, toxic-appearing or diaphoretic.  HENT:     Head: Normocephalic.  Eyes:     General:        Right eye: No discharge.        Left eye: No discharge.     Conjunctiva/sclera: Conjunctivae normal.  Cardiovascular:     Rate and Rhythm: Normal rate.     Pulses: Normal pulses.     Heart sounds: Normal heart sounds.  Pulmonary:     Effort: Pulmonary effort is normal. No respiratory distress.     Breath sounds: Normal breath sounds.  Musculoskeletal:        General: Normal range of motion.  Cervical back: Normal range of motion.  Skin:    General: Skin is warm and dry.  Neurological:     General: No focal deficit present.     Mental Status: She is alert and oriented to person, place, and time.     Coordination: Coordination normal.     Gait: Gait normal.  Psychiatric:        Mood and Affect: Mood normal.        Behavior: Behavior normal.       Assessment & Plan:  Palpitations Assessment & Plan: Vitals:   10/13/22 1501  BP: 99/65   Patient denies chest pain. Lipid and Thyroid panel labs ordered Advise patient can be related to anxiety, Discussed to avoid or limit  caffeine, alcohol, Try relaxation techniques such as mediatation, yoga or deep breathing   Follow up for worsening symptoms .  Orders: -     Lipid panel -     TSH + free T4    Return if symptoms worsen or fail to improve.   Cruzita Lederer Newman Nip, FNP

## 2022-10-13 NOTE — Assessment & Plan Note (Addendum)
Vitals:   10/13/22 1501  BP: 99/65   Patient denies chest pain. Lipid and Thyroid panel labs ordered Advise patient can be related to anxiety, Discussed to avoid or limit caffeine, alcohol, Try relaxation techniques such as mediatation, yoga or deep breathing   Follow up for worsening symptoms .

## 2022-10-14 ENCOUNTER — Ambulatory Visit (INDEPENDENT_AMBULATORY_CARE_PROVIDER_SITE_OTHER): Payer: Medicare Other | Admitting: Gastroenterology

## 2022-10-27 ENCOUNTER — Encounter: Payer: Self-pay | Admitting: Internal Medicine

## 2022-10-27 ENCOUNTER — Ambulatory Visit (INDEPENDENT_AMBULATORY_CARE_PROVIDER_SITE_OTHER): Payer: Medicare Other | Admitting: Internal Medicine

## 2022-10-27 VITALS — BP 122/80 | HR 90 | Ht 67.0 in | Wt 167.6 lb

## 2022-10-27 DIAGNOSIS — F419 Anxiety disorder, unspecified: Secondary | ICD-10-CM

## 2022-10-27 DIAGNOSIS — R002 Palpitations: Secondary | ICD-10-CM

## 2022-10-27 NOTE — Progress Notes (Signed)
Acute Office Visit  Subjective:    Patient ID: [name], female    DOB: [birth year], 67 y.o.   MRN: [id number]  Chief Complaint  Patient presents with   Palpitations    Follow up - has decreased caseinate drinks and feels better.     HPI Patient is in today for follow-up of recent palpitations.  She was having heartbeat sensations in the neck area.  She has cut down caffeinated products for the last 2 weeks and has resolution of palpitations now.  Denies any headache, chest pain or dyspnea currently.  Of note, she takes trazodone 200 mg nightly for insomnia.  She reports having EKG in the last visit, which is unavailable for review today (?), but was told that her EKG was benign by NP.  She still needs to get her lipid profile and TSH checked.  Past Medical History:  Diagnosis Date   Anxiety    Anxiety state 01/01/2018   Bipolar 1 disorder (HCC)    Bipolar disorder (HCC) 12/12/2013   Depression    Diverticulitis    Diverticulitis of colon 03/15/2016   Hyperlipidemia 06/12/2020   Insomnia 01/01/2018   Tobacco abuse 06/25/2016   Tubular adenoma 10/02/2018    Past Surgical History:  Procedure Laterality Date   BIOPSY  05/15/2021   Procedure: BIOPSY;  Surgeon: Dolores Frame, MD;  Location: [facility];  Service: Gastroenterology;;   BIOPSY  08/24/2021   Procedure: BIOPSY;  Surgeon: Lemar Lofty., MD;  Location: [facility];  Service: Gastroenterology;;   BIOPSY  08/12/2022   Procedure: BIOPSY;  Surgeon: Lemar Lofty., MD;  Location: [facility];  Service: Gastroenterology;;   COLONOSCOPY     COLONOSCOPY N/A 08/08/2019   Procedure: COLONOSCOPY;  Surgeon: Malissa Hippo, MD;  Location: [facility];  Service: Endoscopy;  Laterality: N/A;  220   COLONOSCOPY N/A 08/12/2022   Procedure: COLONOSCOPY;  Surgeon: Meridee Score Netty Starring., MD;  Location: [facility];  Service: Gastroenterology;  Laterality: N/A;   COLONOSCOPY WITH PROPOFOL N/A  05/15/2021   large ulcer at IC valve with negative biopsies for dysplasia. two tubular adenomas. Recommending surveillance in 6 months to assess ulcer healing. Patient prefers to undergo this at time of EGD for history of duodenal adenoma.   ENDOSCOPIC MUCOSAL RESECTION  08/24/2021   Dr. Meridee Score: duodenal polyp s/p piecemeal removal, path tubulovillous adenoma. Repeat in 9-12 months.   ENDOSCOPIC MUCOSAL RESECTION  08/12/2022   Procedure: ENDOSCOPIC MUCOSAL RESECTION;  Surgeon: Meridee Score Netty Starring., MD;  Location: [facility];  Service: Gastroenterology;;   ENTEROSCOPY N/A 08/12/2022   Procedure: ENTEROSCOPY;  Surgeon: Meridee Score Netty Starring., MD;  Location: [facility];  Service: Gastroenterology;  Laterality: N/A;   ESOPHAGOGASTRODUODENOSCOPY (EGD) WITH PROPOFOL N/A 05/15/2021   negative H.pylori, duodenal adenoma. Referred for EMR.   ESOPHAGOGASTRODUODENOSCOPY (EGD) WITH PROPOFOL N/A 08/24/2021   Procedure: ESOPHAGOGASTRODUODENOSCOPY (EGD) WITH PROPOFOL;  Surgeon: Meridee Score Netty Starring., MD;  Location: [facility];  Service: Gastroenterology;  Laterality: N/A;   HEMOSTASIS CLIP PLACEMENT  08/24/2021   Procedure: HEMOSTASIS CLIP PLACEMENT;  Surgeon: Lemar Lofty., MD;  Location: [facility];  Service: Gastroenterology;;   HEMOSTASIS CLIP PLACEMENT  08/12/2022   Procedure: HEMOSTASIS CLIP PLACEMENT;  Surgeon: Lemar Lofty., MD;  Location: [facility];  Service: Gastroenterology;;   HOT HEMOSTASIS N/A 08/24/2021   Procedure: HOT HEMOSTASIS (ARGON PLASMA COAGULATION/BICAP);  Surgeon: Lemar Lofty., MD;  Location: [facility];  Service: Gastroenterology;  Laterality: N/A;   HOT HEMOSTASIS N/A  08/12/2022   Procedure: HOT HEMOSTASIS (ARGON PLASMA COAGULATION/BICAP);  Surgeon: Lemar Lofty., MD;  Location: [facility];  Service: Gastroenterology;  Laterality: N/A;   oophrorectomy     POLYPECTOMY  08/08/2019   Procedure: POLYPECTOMY;  Surgeon: Malissa Hippo, MD;  Location: [facility];  Service: Endoscopy;;   POLYPECTOMY  05/15/2021   Procedure: POLYPECTOMY;  Surgeon: Dolores Frame, MD;  Location: [facility];  Service: Gastroenterology;;   POLYPECTOMY  08/12/2022   Procedure: POLYPECTOMY;  Surgeon: Lemar Lofty., MD;  Location: [facility];  Service: Gastroenterology;;   SUBMUCOSAL LIFTING INJECTION  08/24/2021   Procedure: SUBMUCOSAL LIFTING INJECTION;  Surgeon: Lemar Lofty., MD;  Location: [facility];  Service: Gastroenterology;;   SUBMUCOSAL LIFTING INJECTION  08/12/2022   Procedure: SUBMUCOSAL LIFTING INJECTION;  Surgeon: Lemar Lofty., MD;  Location: [facility];  Service: Gastroenterology;;   SUBMUCOSAL TATTOO INJECTION  08/24/2021   Procedure: SUBMUCOSAL TATTOO INJECTION;  Surgeon: Lemar Lofty., MD;  Location: [facility];  Service: Gastroenterology;;   TUBAL LIGATION      Family History  Problem Relation Age of Onset   Cancer Mother        lichen planus - oral cancer   Cancer Father        lung   Cancer Maternal Uncle        uterine   Diabetes Maternal Uncle    Stroke Maternal Grandmother    COPD Paternal Grandfather    Colon cancer Neg Hx    Esophageal cancer Neg Hx    Stomach cancer Neg Hx    Liver cancer Neg Hx    Pancreatic cancer Neg Hx     Social History   Socioeconomic History   Marital status: Divorced    Spouse name: Not on file   Number of children: 2   Years of education: 13   Highest education level: Not on file  Occupational History   Occupation: Manufacturing engineer    Comment: [employer], [place]  Tobacco Use   Smoking status: Former    Current packs/day: 0.00    Types: Cigarettes    Quit date: 08/19/2021    Years since quitting: 1.1   Smokeless tobacco: Never  Vaping Use   Vaping status: Never Used  Substance and Sexual Activity   Alcohol use: Yes    Alcohol/week: 2.0 standard drinks of alcohol    Types: 2 Glasses of  wine per week    Comment: socially   Drug use: No   Sexual activity: Not Currently    Birth control/protection: Post-menopausal  Other Topics Concern   Not on file  Social History Narrative   Lives alone   She has 2 sons that are close by.      She enjoys going out to dinner and spend time with family.      Diet reports eating all foods.   Caffeine none   Water 6 to 8 cups regularly daily.      Social Determinants of Health   Financial Resource Strain: Not on file  Food Insecurity: Not on file  Transportation Needs: Not on file  Physical Activity: Not on file  Stress: Not on file  Social Connections: Not on file  Intimate Partner Violence: Not At Risk (02/09/2022)   Received from [facility]   Humiliation, Afraid, Rape, and Kick questionnaire    Fear of Current or Ex-Partner: No    Emotionally Abused: No    Physically  Abused: No    Sexually Abused: No    Outpatient Medications Prior to Visit  Medication Sig Dispense Refill   acetaminophen (TYLENOL) 500 MG tablet Take 500 mg by mouth every 6 (six) hours as needed for mild pain or moderate pain.     ALPRAZolam (XANAX) 0.5 MG tablet TAKE 1 TABLET(0.5 MG) BY MOUTH THREE TIMES DAILY AS NEEDED FOR ANXIETY 90 tablet 5   busPIRone (BUSPAR) 15 MG tablet TAKE 1 TABLET(15 MG) BY MOUTH TWICE DAILY 180 tablet 3   dicyclomine (BENTYL) 20 MG tablet TAKE 1 TABLET(20 MG) BY MOUTH THREE TIMES DAILY AS NEEDED FOR ABDOMINAL PAIN 30 tablet 1   lamoTRIgine (LAMICTAL) 200 MG tablet TAKE 1 TABLET(200 MG) BY MOUTH TWICE DAILY 180 tablet 3   pantoprazole (PROTONIX) 40 MG tablet Take 1 tablet (40 mg total) by mouth 2 (two) times daily before a meal. 60 tablet 12   sucralfate (CARAFATE) 1 g tablet Take 1 tablet (1 g total) by mouth 2 (two) times daily. 60 tablet 0   traZODone (DESYREL) 100 MG tablet Take 2 tablets (200 mg total) by mouth at bedtime as needed. 180 tablet 3   No facility-administered medications prior to visit.     Allergies  Allergen Reactions   Meperidine Hives   5-Alpha Reductase Inhibitors Other (See Comments)    unknown    Review of Systems  Constitutional:  Negative for chills and fever.  HENT:  Negative for congestion, sinus pressure, sinus pain and sore throat.   Eyes:  Negative for pain and discharge.  Respiratory:  Negative for cough and shortness of breath.   Cardiovascular:  Positive for palpitations (Resolved now). Negative for chest pain.  Gastrointestinal:  Negative for abdominal pain, diarrhea, nausea and vomiting.  Endocrine: Negative for polydipsia and polyuria.  Genitourinary:  Negative for dysuria and hematuria.  Musculoskeletal:  Negative for neck pain and neck stiffness.  Skin:  Negative for rash.  Neurological:  Negative for dizziness and weakness.  Psychiatric/Behavioral:  Negative for agitation and behavioral problems.        Objective:    Physical Exam Vitals reviewed.  Constitutional:      General: She is not in acute distress.    Appearance: She is not diaphoretic.  HENT:     Mouth/Throat:     Mouth: Mucous membranes are moist.  Eyes:     General: No scleral icterus.    Extraocular Movements: Extraocular movements intact.  Cardiovascular:     Rate and Rhythm: Normal rate and regular rhythm.     Pulses: Normal pulses.     Heart sounds: Normal heart sounds. No murmur heard. Pulmonary:     Breath sounds: Normal breath sounds. No wheezing or rales.  Musculoskeletal:     Cervical back: Neck supple. No tenderness.     Right lower leg: No edema.     Left lower leg: No edema.  Skin:    General: Skin is warm.     Findings: No rash.  Neurological:     General: No focal deficit present.     Mental Status: She is alert and oriented to person, place, and time.  Psychiatric:        Mood and Affect: Mood normal.        Behavior: Behavior normal.     BP 122/80 (BP Location: Left Arm, Patient Position: Sitting, Cuff Size: Normal)   Pulse 90   Ht 5\' 7"   (1.702 m)   Wt 167 lb 9.6 oz (76  kg)   SpO2 94%   BMI 26.25 kg/m  Wt Readings from Last 3 Encounters:  10/27/22 167 lb 9.6 oz (76 kg)  10/13/22 167 lb (75.8 kg)  08/05/22 157 lb (71.2 kg)        Assessment & Plan:   Problem List Items Addressed This Visit       Other   Anxiety    Continue trazodone for now      Palpitations - Primary    Was related to heavy caffeinated product intake Has cut down soft drinks and coffee intake Palpitations resolved now She takes trazodone, can lead to palpitations sometimes - since her sleep symptoms have resolved now, no further workup needed Would prefer to review EKG when available in chart Check lipid profile and TSH        No orders of the defined types were placed in this encounter.    Anabel Halon, MD

## 2022-10-27 NOTE — Assessment & Plan Note (Addendum)
Was related to heavy caffeinated product intake Has cut down soft drinks and coffee intake Palpitations resolved now She takes trazodone, can lead to palpitations sometimes - since her sleep symptoms have resolved now, no further workup needed Would prefer to review EKG when available in chart Check lipid profile and TSH

## 2022-10-27 NOTE — Patient Instructions (Signed)
Please continue to take medications as prescribed.  Please continue to follow low salt diet and perform moderate exercise/walking at least 150 mins/week. 

## 2022-10-27 NOTE — Assessment & Plan Note (Signed)
Continue trazodone for now.

## 2022-10-27 NOTE — Addendum Note (Signed)
Addended by: Rica Records on: 10/27/2022 11:58 AM   Modules accepted: Orders

## 2022-11-01 ENCOUNTER — Encounter: Payer: Self-pay | Admitting: Gastroenterology

## 2022-11-11 ENCOUNTER — Ambulatory Visit (INDEPENDENT_AMBULATORY_CARE_PROVIDER_SITE_OTHER): Payer: Medicare Other | Admitting: Family Medicine

## 2022-11-11 ENCOUNTER — Encounter: Payer: Self-pay | Admitting: Family Medicine

## 2022-11-11 VITALS — BP 97/69 | HR 69 | Ht 67.0 in | Wt 166.1 lb

## 2022-11-11 DIAGNOSIS — K140 Glossitis: Secondary | ICD-10-CM | POA: Diagnosis not present

## 2022-11-11 DIAGNOSIS — F419 Anxiety disorder, unspecified: Secondary | ICD-10-CM

## 2022-11-11 MED ORDER — NYSTATIN 100000 UNIT/ML MT SUSP: 5.0000 mL | Freq: Three times a day (TID) | OROMUCOSAL | 0 refills | Status: DC

## 2022-11-11 NOTE — Assessment & Plan Note (Addendum)
Dukes x 10 days and re eval with PCP

## 2022-11-11 NOTE — Progress Notes (Signed)
   [name]     MRN: [id number]      DOB: 1956/03/07  Chief Complaint  Patient presents with   Mouth Lesions    White tongue, sores in mouth that present w/  burning pain and tenderness on inside of lips and tip of tongue.    Flu Vaccine    Flu vaccine declined.     HPI [name] is here with 1 week h/o above, states she has been working at [employer] , handling a lot of dirty money and putting her finger to the tip of the tongue which she feels aggravated her current problem Hs also recently noted white coating on her tongue. Has some anxiety due to new symptoms , concerned that  it may be cancer ROS Denies recent fever or chills. Denies sinus pressure, nasal congestion, ear pain or sore throat. Denies chest congestion, productive cough or wheezing.  Denies depression, anxiety or insomnia. Denies skin break down or rash.   PE  BP 97/69 (BP Location: Right Arm, Patient Position: Sitting, Cuff Size: Large)   Pulse 69   Ht 5\' 7"  (1.702 m)   Wt 166 lb 1.3 oz (75.3 kg)   SpO2 92%   BMI 26.01 kg/m   Patient alert and oriented and in no cardiopulmonary distress.  HEENT: No facial asymmetry, EOMI,     Neck supple .Tongue white coating present, mild erythema and shallow ulcer at tip of tongue approximately 0.25 cm diameter  Chest: Clear to auscultation bilaterally.Decreased air entry  CVS: S1, S2 no murmurs, no S3.Regular rate.    Ext: No edema  MS: Adequate ROM spine, shoulders, hips and knees.  Skin: Intact, no ulcerations or rash noted.  Psych: Good eye contact, normal affect. Mildly  anxious not  depressed appearing.  CNS: CN 2-12 intact, no focal deficits noted.   Assessment & Plan  Glossitis Dukes x 10 days and re eval with PCP  Anxiety Encouraged to try to reduce her anxiety as this negatively affects health

## 2022-11-11 NOTE — Patient Instructions (Addendum)
F/u with PCP ( Dr Allena Katz) in 10 to 14 days, call if you need to be seen sooner  Mouthwash is prescribed for 10 days, take as directed please and keep hands and fingers away from  mouth and tongue as much as possible like you have started doing  Do not worry as this negatively affects health  Keep follow up please  Thanks for choosing [facility], we consider it a privelige to serve you.

## 2022-11-12 ENCOUNTER — Telehealth: Payer: Self-pay | Admitting: Internal Medicine

## 2022-11-12 NOTE — Telephone Encounter (Signed)
Patient [name] says her pharmacy is unable to fill her medications- requesting [facility]. Also requesting a call from the nurse wanting to know if her condition is contagious and can she have her cocktails and glass of wine. Please advise Thank you

## 2022-11-15 ENCOUNTER — Encounter: Payer: Self-pay | Admitting: Family Medicine

## 2022-11-15 NOTE — Assessment & Plan Note (Signed)
Encouraged to try to reduce her anxiety as this negatively affects health

## 2022-11-16 ENCOUNTER — Other Ambulatory Visit: Payer: Self-pay

## 2022-11-16 MED ORDER — NYSTATIN 100000 UNIT/ML MT SUSP: 5.0000 mL | Freq: Three times a day (TID) | OROMUCOSAL | 0 refills | Status: DC

## 2022-11-24 ENCOUNTER — Other Ambulatory Visit: Payer: Self-pay

## 2022-11-24 MED ORDER — NYSTATIN 100000 UNIT/ML MT SUSP: 5.0000 mL | Freq: Three times a day (TID) | OROMUCOSAL | 0 refills | Status: DC

## 2022-11-29 ENCOUNTER — Ambulatory Visit (INDEPENDENT_AMBULATORY_CARE_PROVIDER_SITE_OTHER): Payer: Medicare Other | Admitting: Internal Medicine

## 2022-11-29 ENCOUNTER — Encounter: Payer: Self-pay | Admitting: Internal Medicine

## 2022-11-29 VITALS — BP 103/70 | HR 67 | Ht 67.0 in | Wt 172.2 lb

## 2022-11-29 DIAGNOSIS — F419 Anxiety disorder, unspecified: Secondary | ICD-10-CM | POA: Diagnosis not present

## 2022-11-29 DIAGNOSIS — B370 Candidal stomatitis: Secondary | ICD-10-CM | POA: Insufficient documentation

## 2022-11-29 DIAGNOSIS — K144 Atrophy of tongue papillae: Secondary | ICD-10-CM | POA: Insufficient documentation

## 2022-11-29 NOTE — Assessment & Plan Note (Addendum)
Has BuSpar 15 mg twice daily and Xanax as needed Continue trazodone for insomnia Has anxiety about her health conditions as well

## 2022-11-29 NOTE — Assessment & Plan Note (Signed)
Uses Magic mouthwash with relief in soreness Currently does not have patches over tongue or in buccal mucosa

## 2022-11-29 NOTE — Patient Instructions (Addendum)
Please use magic mouthwash as needed for soreness of tongue.  Please perform salt water gargling for oral/tongue patch or discomfort.  Please take [name]' health multivitamin or B-complex once daily.

## 2022-11-29 NOTE — Progress Notes (Signed)
Acute Office Visit  Subjective:    Patient ID: [name], female    DOB: [birth year], 67 y.o.   MRN: [id number]  Chief Complaint  Patient presents with   Thrush    Patient following up for thrush , she states it is still coming and going    HPI Patient is in today for follow-up of oral thrush.  She was given Magic mouthwash for concern of oral thrush.  She mentions having white coated tongue especially in the morning, which improves with Magic mouthwash.  She reports that her tongue lesion is recurring and is slightly painful at times.  Denies any oral buccal mucosa ulcers.  She is currently concerned because of her history of smoking.  Past Medical History:  Diagnosis Date   Anxiety    Anxiety state 01/01/2018   Bipolar 1 disorder (HCC)    Bipolar disorder (HCC) 12/12/2013   Depression    Diverticulitis    Diverticulitis of colon 03/15/2016   Hyperlipidemia 06/12/2020   Insomnia 01/01/2018   Tobacco abuse 06/25/2016   Tubular adenoma 10/02/2018    Past Surgical History:  Procedure Laterality Date   BIOPSY  05/15/2021   Procedure: BIOPSY;  Surgeon: Dolores Frame, MD;  [facility];  Service: Gastroenterology;;   BIOPSY  08/24/2021   Procedure: BIOPSY;  Surgeon: Lemar Lofty., MD;  Location: [facility];  Service: Gastroenterology;;   BIOPSY  08/12/2022   Procedure: BIOPSY;  Surgeon: Lemar Lofty., MD;  Location: [facility];  Service: Gastroenterology;;   COLONOSCOPY     COLONOSCOPY N/A 08/08/2019   Procedure: COLONOSCOPY;  Surgeon: Malissa Hippo, MD;  [facility];  Service: Endoscopy;  Laterality: N/A;  220   COLONOSCOPY N/A 08/12/2022   Procedure: COLONOSCOPY;  Surgeon: Meridee Score Netty Starring., MD;  Location: [facility];  Service: Gastroenterology;  Laterality: N/A;   COLONOSCOPY WITH PROPOFOL N/A 05/15/2021   large ulcer at IC valve with negative biopsies for dysplasia. two tubular adenomas. Recommending  surveillance in 6 months to assess ulcer healing. Patient prefers to undergo this at time of EGD for history of duodenal adenoma.   ENDOSCOPIC MUCOSAL RESECTION  08/24/2021   Dr. Meridee Score: duodenal polyp s/p piecemeal removal, path tubulovillous adenoma. Repeat in 9-12 months.   ENDOSCOPIC MUCOSAL RESECTION  08/12/2022   Procedure: ENDOSCOPIC MUCOSAL RESECTION;  Surgeon: Meridee Score Netty Starring., MD;  Location: [facility];  Service: Gastroenterology;;   ENTEROSCOPY N/A 08/12/2022   Procedure: ENTEROSCOPY;  Surgeon: Lemar Lofty., MD;  [facility];  Service: Gastroenterology;  Laterality: N/A;   ESOPHAGOGASTRODUODENOSCOPY (EGD) WITH PROPOFOL N/A 05/15/2021   negative H.pylori, duodenal adenoma. Referred for EMR.   ESOPHAGOGASTRODUODENOSCOPY (EGD) WITH PROPOFOL N/A 08/24/2021   Procedure: ESOPHAGOGASTRODUODENOSCOPY (EGD) WITH PROPOFOL;  Surgeon: Meridee Score Netty Starring., MD;  [facility];  Service: Gastroenterology;  Laterality: N/A;   HEMOSTASIS CLIP PLACEMENT  08/24/2021   Procedure: HEMOSTASIS CLIP PLACEMENT;  Surgeon: Lemar Lofty., MD;  Location: [facility];  Service: Gastroenterology;;   HEMOSTASIS CLIP PLACEMENT  08/12/2022   Procedure: HEMOSTASIS CLIP PLACEMENT;  Surgeon: Lemar Lofty., MD;  [facility];  Service: Gastroenterology;;   HOT HEMOSTASIS N/A 08/24/2021   Procedure: HOT HEMOSTASIS (ARGON PLASMA COAGULATION/BICAP);  Surgeon: Lemar Lofty., MD;  Location: [facility];  Service: Gastroenterology;  Laterality: N/A;   HOT HEMOSTASIS N/A 08/12/2022   Procedure: HOT HEMOSTASIS (ARGON PLASMA COAGULATION/BICAP);  Surgeon: Lemar Lofty., MD;  Location: [facility];  Service: Gastroenterology;  Laterality: N/A;   oophrorectomy     POLYPECTOMY  08/08/2019   Procedure: POLYPECTOMY;  Surgeon: Malissa Hippo, MD;  [facility];  Service: Endoscopy;;   POLYPECTOMY  05/15/2021   Procedure:  POLYPECTOMY;  Surgeon: Dolores Frame, MD;  [facility];  Service: Gastroenterology;;   POLYPECTOMY  08/12/2022   Procedure: POLYPECTOMY;  Surgeon: Lemar Lofty., MD;  Location: [facility];  Service: Gastroenterology;;   SUBMUCOSAL LIFTING INJECTION  08/24/2021   Procedure: SUBMUCOSAL LIFTING INJECTION;  Surgeon: Lemar Lofty., MD;  Location: [facility];  Service: Gastroenterology;;   SUBMUCOSAL LIFTING INJECTION  08/12/2022   Procedure: SUBMUCOSAL LIFTING INJECTION;  Surgeon: Lemar Lofty., MD;  Location: [facility];  Service: Gastroenterology;;   SUBMUCOSAL TATTOO INJECTION  08/24/2021   Procedure: SUBMUCOSAL TATTOO INJECTION;  Surgeon: Lemar Lofty., MD;  [facility]: [facility];  Service: Gastroenterology;;   TUBAL LIGATION      Family History  Problem Relation Age of Onset   Cancer Mother        lichen planus - oral cancer   Cancer Father        lung   Cancer Maternal Uncle        uterine   Diabetes Maternal Uncle    Stroke Maternal Grandmother    COPD Paternal Grandfather    Colon cancer Neg Hx    Esophageal cancer Neg Hx    Stomach cancer Neg Hx    Liver cancer Neg Hx    Pancreatic cancer Neg Hx     Social History   Socioeconomic History   Marital status: Divorced    Spouse name: Not on file   Number of children: 2   Years of education: 13   Highest education level: Not on file  Occupational History   Occupation: Manufacturing engineer    Comment: [employer], [place]  Tobacco Use   Smoking status: Former    Current packs/day: 0.00    Types: Cigarettes    Quit date: 08/19/2021    Years since quitting: 1.2   Smokeless tobacco: Never  Vaping Use   Vaping status: Never Used  Substance and Sexual Activity   Alcohol use: Yes    Alcohol/week: 2.0 standard drinks of alcohol    Types: 2 Glasses of wine per week    Comment: socially   Drug use: No   Sexual activity: Not Currently    Birth  control/protection: Post-menopausal  Other Topics Concern   Not on file  Social History Narrative   Lives alone   She has 2 sons that are close by.      She enjoys going out to dinner and spend time with family.      Diet reports eating all foods.   Caffeine none   Water 6 to 8 cups regularly daily.      Social Determinants of Health   Financial Resource Strain: Not on file  Food Insecurity: Not on file  Transportation Needs: Not on file  Physical Activity: Not on file  Stress: Not on file  Social Connections: Not on file  Intimate Partner Violence: Not At Risk (02/09/2022)   Received from [facility], [facility]   Humiliation, Afraid, Rape, and Kick questionnaire    Fear of Current or Ex-Partner: No    Emotionally Abused: No    Physically Abused: No    Sexually Abused: No    Outpatient Medications Prior to Visit  Medication Sig Dispense Refill  acetaminophen (TYLENOL) 500 MG tablet Take 500 mg by mouth every 6 (six) hours as needed for mild pain or moderate pain.     ALPRAZolam (XANAX) 0.5 MG tablet TAKE 1 TABLET(0.5 MG) BY MOUTH THREE TIMES DAILY AS NEEDED FOR ANXIETY 90 tablet 5   busPIRone (BUSPAR) 15 MG tablet TAKE 1 TABLET(15 MG) BY MOUTH TWICE DAILY 180 tablet 3   dicyclomine (BENTYL) 20 MG tablet TAKE 1 TABLET(20 MG) BY MOUTH THREE TIMES DAILY AS NEEDED FOR ABDOMINAL PAIN (Patient not taking: Reported on 11/11/2022) 30 tablet 1   lamoTRIgine (LAMICTAL) 200 MG tablet TAKE 1 TABLET(200 MG) BY MOUTH TWICE DAILY 180 tablet 3   magic mouthwash (nystatin, hydrocortisone, diphenhydrAMINE) suspension Swish and spit 5 mLs 3 (three) times daily. 150 mL 0   pantoprazole (PROTONIX) 40 MG tablet Take 1 tablet (40 mg total) by mouth 2 (two) times daily before a meal. (Patient not taking: Reported on 11/11/2022) 60 tablet 12   sucralfate (CARAFATE) 1 g tablet Take 1 tablet (1 g total) by mouth 2 (two) times daily. (Patient not taking: Reported on 11/11/2022) 60 tablet 0    traZODone (DESYREL) 100 MG tablet Take 2 tablets (200 mg total) by mouth at bedtime as needed. 180 tablet 3   No facility-administered medications prior to visit.    Allergies  Allergen Reactions   Meperidine Hives   5-Alpha Reductase Inhibitors Other (See Comments)    unknown    Review of Systems  Constitutional:  Negative for chills and fever.  HENT:  Negative for congestion, sinus pressure, sinus pain and sore throat.   Eyes:  Negative for pain and discharge.  Respiratory:  Negative for cough and shortness of breath.   Cardiovascular:  Negative for chest pain and palpitations.  Gastrointestinal:  Negative for abdominal pain, diarrhea, nausea and vomiting.  Endocrine: Negative for polydipsia and polyuria.  Genitourinary:  Negative for dysuria and hematuria.  Musculoskeletal:  Negative for neck pain and neck stiffness.  Skin:  Negative for rash.  Neurological:  Negative for dizziness and weakness.  Psychiatric/Behavioral:  Negative for agitation and behavioral problems.        Objective:    Physical Exam Vitals reviewed.  Constitutional:      General: She is not in acute distress.    Appearance: She is not diaphoretic.  HENT:     Mouth/Throat:     Mouth: Mucous membranes are moist.     Tongue: No lesions.     Pharynx: No posterior oropharyngeal erythema.  Eyes:     General: No scleral icterus.    Extraocular Movements: Extraocular movements intact.  Cardiovascular:     Rate and Rhythm: Normal rate and regular rhythm.     Pulses: Normal pulses.     Heart sounds: Normal heart sounds. No murmur heard. Pulmonary:     Breath sounds: Normal breath sounds. No wheezing or rales.  Musculoskeletal:     Cervical back: Neck supple. No tenderness.     Right lower leg: No edema.     Left lower leg: No edema.  Skin:    General: Skin is warm.     Findings: No rash.  Neurological:     General: No focal deficit present.     Mental Status: She is alert and oriented to person,  place, and time.  Psychiatric:        Mood and Affect: Mood normal.        Behavior: Behavior normal.     BP 103/70 (BP  Location: Left Arm, Patient Position: Sitting, Cuff Size: Normal)   Pulse 67   Ht 5\' 7"  (1.702 m)   Wt 172 lb 3.2 oz (78.1 kg)   SpO2 95%   BMI 26.97 kg/m  Wt Readings from Last 3 Encounters:  11/29/22 172 lb 3.2 oz (78.1 kg)  11/11/22 166 lb 1.3 oz (75.3 kg)  10/27/22 167 lb 9.6 oz (76 kg)        Assessment & Plan:   Problem List Items Addressed This Visit       Digestive   Oral thrush - Primary    Uses Magic mouthwash with relief in soreness Currently does not have patches over tongue or in buccal mucosa      Atrophic glossitis    Raw feeling of tongue could be atrophic glossitis - her tongue looks to have atrophic look than oral candidiasis Reassured that she does not even have leukoplakia, but she should still consider dental evaluation considering her history of dental workup including root canal surgery in the past Can consider taking B complex vitamin, especially riboflavin, niacin and pyridoxine        Other   Anxiety    Has BuSpar 15 mg twice daily and Xanax as needed Continue trazodone for insomnia Has anxiety about her health conditions as well        No orders of the defined types were placed in this encounter.    Anabel Halon, MD

## 2022-11-29 NOTE — Assessment & Plan Note (Signed)
Raw feeling of tongue could be atrophic glossitis - her tongue looks to have atrophic look than oral candidiasis Reassured that she does not even have leukoplakia, but she should still consider dental evaluation considering her history of dental workup including root canal surgery in the past Can consider taking B complex vitamin, especially riboflavin, niacin and pyridoxine

## 2022-12-08 DIAGNOSIS — T1490XA Injury, unspecified, initial encounter: Secondary | ICD-10-CM | POA: Diagnosis not present

## 2022-12-08 DIAGNOSIS — Z743 Need for continuous supervision: Secondary | ICD-10-CM | POA: Diagnosis not present

## 2022-12-08 DIAGNOSIS — R519 Headache, unspecified: Secondary | ICD-10-CM | POA: Diagnosis not present

## 2022-12-08 DIAGNOSIS — S0990XA Unspecified injury of head, initial encounter: Secondary | ICD-10-CM | POA: Diagnosis not present

## 2022-12-08 DIAGNOSIS — W108XXA Fall (on) (from) other stairs and steps, initial encounter: Secondary | ICD-10-CM | POA: Diagnosis not present

## 2022-12-08 NOTE — ED Provider Notes (Signed)
 [facility] FONDA JASMINE, MD  [name] ED [address] [place] [address]  [name]   1955-07-09 [phone] y.o.)    Chief Complaint: Fall (Patient c/o tripping and falling down 3 steps.  Patient denies LOC, denies blood thinners.  )  History of Present Illness: 67 year old female presenting for evaluation of closed head injury.  She was coming down some stairs tripped and fell rolling down the stairs and then striking her head against the concrete.  She did not lose consciousness but was dizzy when she tried to stand up.  She has had some tingling in her arms but no weakness and only mild neck discomfort.  She did walk after the accident.       Glasgow Coma Scale Score: 15               Medical and Surgical History: [name] has a past medical history of Anxiety and Bipolar 1 disorder (HCC). [name] has no past surgical history on file.    ED Triage Vital Signs: Temp: 37.1 C (98.7 F), Heart Rate: 84, Resp: 19, BP: 130/77, SpO2: 95 %, Temp Source: Oral, Heart Rate Source: Monitor, Patient Position: Sitting, BP Location: Right arm  Physical Exam Vitals and nursing note reviewed.  Constitutional:      General: She is not in acute distress.    Appearance: She is well-developed.  HENT:     Head: Normocephalic and atraumatic.  Eyes:     Conjunctiva/sclera: Conjunctivae normal.  Neck:     Comments: In c-collar Cardiovascular:     Rate and Rhythm: Normal rate and regular rhythm.     Heart sounds: No murmur heard. Pulmonary:     Effort: Pulmonary effort is normal. No respiratory distress.     Breath sounds: Normal breath sounds.  Abdominal:     Palpations: Abdomen is soft.     Tenderness: There is no abdominal tenderness.  Musculoskeletal:        General: No swelling.     Comments: Strength in upper extremities equal bilaterally with normal reflexes and coordination, no anesthesia in a cape pattern  Skin:    General:  Skin is warm and dry.     Capillary Refill: Capillary refill takes less than 2 seconds.  Neurological:     Mental Status: She is alert.  Psychiatric:        Mood and Affect: Mood normal.     Procedures ED Course as of 12/08/22 1307  Wed Dec 08, 2022  1247 Imaging clear.  Discussed concussion precautions. [JS]    ED Course User Index [JS] JASMINE FONDA, MD      Diagnoses as of 12/08/22 1307  Closed head injury, initial encounter  Post concussion syndrome   Medical Decision Making 67 year old female presenting for fall with some neck discomfort, tingling in her arms and struck the back of her head on concrete.  Head CT and neck CT were ordered and are unremarkable.  Patient has no weakness or loss of reflexes in her upper extremities so this seems to be more of a cervical root impingement.  Patient is also having some dizziness secondary to the fall which is consistent with a postconcussive syndrome.  Give the patient return precautions for central cord syndrome type symptoms although I have low suspicion that this will occur.  I personally reviewed all nursing notes, vital signs, diagnostic tests and their results.  I discussed these findings and their significance with the  patient.  A plan was established in collaboration with the patient and clear return precautions were elaborated during this conversation.  There does not appear to be an acute life threatening event occurring at this time and the patient was discharged from the emergency department in stable condition  This note was recorded using Dragon dictation software.  As a result, some misinterpretations or misspellings may exist.   Disposition: DISCHARGE  ED Prescriptions     Medication Sig Dispense Start Date End Date Auth. Provider   ondansetron  ODT (Zofran -ODT) 4 mg disintegrating tablet Take 1 tablet (4 mg) by mouth every 8 (eight) hours if needed for nausea or vomiting for up to 10 days. 30 tablet 12/08/2022  12/18/2022 Maurie Chew, MD             Maurie Chew, MD 12/08/22 [phone]

## 2022-12-13 ENCOUNTER — Encounter: Payer: Self-pay | Admitting: Internal Medicine

## 2022-12-13 ENCOUNTER — Telehealth: Payer: Medicare Other | Admitting: Internal Medicine

## 2022-12-13 DIAGNOSIS — W19XXXA Unspecified fall, initial encounter: Secondary | ICD-10-CM | POA: Insufficient documentation

## 2022-12-13 DIAGNOSIS — F0781 Postconcussional syndrome: Secondary | ICD-10-CM | POA: Insufficient documentation

## 2022-12-13 DIAGNOSIS — W19XXXD Unspecified fall, subsequent encounter: Secondary | ICD-10-CM | POA: Diagnosis not present

## 2022-12-13 NOTE — Progress Notes (Unsigned)
Acute Office Visit  Subjective:    Patient ID: [name], female    DOB: [birth year], 67 y.o.   MRN: [id number]  Chief Complaint  Patient presents with   Follow-up    Er follow up fall feels off balance     HPI Patient is in today for ***  Past Medical History:  Diagnosis Date   Anxiety    Anxiety state 01/01/2018   Bipolar 1 disorder (HCC)    Bipolar disorder (HCC) 12/12/2013   Depression    Diverticulitis    Diverticulitis of colon 03/15/2016   Hyperlipidemia 06/12/2020   Insomnia 01/01/2018   Tobacco abuse 06/25/2016   Tubular adenoma 10/02/2018    Past Surgical History:  Procedure Laterality Date   BIOPSY  05/15/2021   Procedure: BIOPSY;  Surgeon: Dolores Frame, MD;  [facility];  Service: Gastroenterology;;   BIOPSY  08/24/2021   Procedure: BIOPSY;  Surgeon: Lemar Lofty., MD;  Location: [facility];  Service: Gastroenterology;;   BIOPSY  08/12/2022   Procedure: BIOPSY;  Surgeon: Lemar Lofty., MD;  Location: [facility];  Service: Gastroenterology;;   COLONOSCOPY     COLONOSCOPY N/A 08/08/2019   Procedure: COLONOSCOPY;  Surgeon: Malissa Hippo, MD;  [facility];  Service: Endoscopy;  Laterality: N/A;  220   COLONOSCOPY N/A 08/12/2022   Procedure: COLONOSCOPY;  Surgeon: Meridee Score Netty Starring., MD;  Location: [facility];  Service: Gastroenterology;  Laterality: N/A;   COLONOSCOPY WITH PROPOFOL N/A 05/15/2021   large ulcer at IC valve with negative biopsies for dysplasia. two tubular adenomas. Recommending surveillance in 6 months to assess ulcer healing. Patient prefers to undergo this at time of EGD for history of duodenal adenoma.   ENDOSCOPIC MUCOSAL RESECTION  08/24/2021   Dr. Meridee Score: duodenal polyp s/p piecemeal removal, path tubulovillous adenoma. Repeat in 9-12 months.   ENDOSCOPIC MUCOSAL RESECTION  08/12/2022   Procedure: ENDOSCOPIC MUCOSAL RESECTION;  Surgeon: Meridee Score Netty Starring.,  MD;  Location: [facility];  Service: Gastroenterology;;   ENTEROSCOPY N/A 08/12/2022   Procedure: ENTEROSCOPY;  Surgeon: Lemar Lofty., MD;  Location: [facility];  Service: Gastroenterology;  Laterality: N/A;   ESOPHAGOGASTRODUODENOSCOPY (EGD) WITH PROPOFOL N/A 05/15/2021   negative H.pylori, duodenal adenoma. Referred for EMR.   ESOPHAGOGASTRODUODENOSCOPY (EGD) WITH PROPOFOL N/A 08/24/2021   Procedure: ESOPHAGOGASTRODUODENOSCOPY (EGD) WITH PROPOFOL;  Surgeon: Meridee Score Netty Starring., MD;  Location: [facility];  Service: Gastroenterology;  Laterality: N/A;   HEMOSTASIS CLIP PLACEMENT  08/24/2021   Procedure: HEMOSTASIS CLIP PLACEMENT;  Surgeon: Lemar Lofty., MD;  Location: [facility];  Service: Gastroenterology;;   HEMOSTASIS CLIP PLACEMENT  08/12/2022   Procedure: HEMOSTASIS CLIP PLACEMENT;  Surgeon: Lemar Lofty., MD;  Location: [facility];  Service: Gastroenterology;;   HOT HEMOSTASIS N/A 08/24/2021   Procedure: HOT HEMOSTASIS (ARGON PLASMA COAGULATION/BICAP);  Surgeon: Lemar Lofty., MD;  Location: [facility];  Service: Gastroenterology;  Laterality: N/A;   HOT HEMOSTASIS N/A 08/12/2022   Procedure: HOT HEMOSTASIS (ARGON PLASMA COAGULATION/BICAP);  Surgeon: Lemar Lofty., MD;  Location: [facility];  Service: Gastroenterology;  Laterality: N/A;   oophrorectomy     POLYPECTOMY  08/08/2019   Procedure: POLYPECTOMY;  Surgeon: Malissa Hippo, MD;  [facility]: [facility];  Service: Endoscopy;;   POLYPECTOMY  05/15/2021   Procedure: POLYPECTOMY;  Surgeon: Dolores Frame, MD;  [facility];  Service: Gastroenterology;;   POLYPECTOMY  08/12/2022   Procedure: POLYPECTOMY;  Surgeon: Lemar Lofty., MD;  Location: [facility];  Service: Gastroenterology;;   SUBMUCOSAL LIFTING INJECTION  08/24/2021   Procedure: SUBMUCOSAL LIFTING INJECTION;  Surgeon: Lemar Lofty., MD;  Location: [facility];   Service: Gastroenterology;;   SUBMUCOSAL LIFTING INJECTION  08/12/2022   Procedure: SUBMUCOSAL LIFTING INJECTION;  Surgeon: Lemar Lofty., MD;  Location: [facility];  Service: Gastroenterology;;   SUBMUCOSAL TATTOO INJECTION  08/24/2021   Procedure: SUBMUCOSAL TATTOO INJECTION;  Surgeon: Lemar Lofty., MD;  Location: [facility];  Service: Gastroenterology;;   TUBAL LIGATION      Family History  Problem Relation Age of Onset   Cancer Mother        lichen planus - oral cancer   Cancer Father        lung   Cancer Maternal Uncle        uterine   Diabetes Maternal Uncle    Stroke Maternal Grandmother    COPD Paternal Grandfather    Colon cancer Neg Hx    Esophageal cancer Neg Hx    Stomach cancer Neg Hx    Liver cancer Neg Hx    Pancreatic cancer Neg Hx     Social History   Socioeconomic History   Marital status: Divorced    Spouse name: Not on file   Number of children: 2   Years of education: 13   Highest education level: Not on file  Occupational History   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      She enjoys going out to dinner and spend time with family.      Diet reports eating all foods.   Caffeine none   Water 6 to 8 cups regularly daily.      Social Determinants of Health   Financial Resource Strain: Not on file  Food Insecurity:  Not on file  Transportation Needs: Not on file  Physical Activity: Not on file  Stress: Not on file  Social Connections: Not on file  Intimate Partner Violence: Not At Risk (02/09/2022)   Received from [facility], [facility]   Humiliation, Afraid, Rape, and Kick questionnaire    Fear of Current or Ex-Partner: No    Emotionally Abused: No    Physically Abused: No    Sexually Abused: No    Outpatient Medications Prior to Visit  Medication Sig Dispense Refill   acetaminophen (TYLENOL) 500 MG tablet Take 500 mg by mouth every 6 (six) hours as needed for mild pain or moderate pain.     ALPRAZolam (XANAX) 0.5 MG tablet TAKE 1 TABLET(0.5 MG) BY MOUTH THREE TIMES DAILY AS NEEDED FOR ANXIETY 90 tablet 5   busPIRone (BUSPAR) 15 MG tablet TAKE 1 TABLET(15 MG) BY MOUTH TWICE DAILY 180 tablet 3   dicyclomine (BENTYL)  20 MG tablet TAKE 1 TABLET(20 MG) BY MOUTH THREE TIMES DAILY AS NEEDED FOR ABDOMINAL PAIN 30 tablet 1   lamoTRIgine (LAMICTAL) 200 MG tablet TAKE 1 TABLET(200 MG) BY MOUTH TWICE DAILY 180 tablet 3   magic mouthwash (nystatin, hydrocortisone, diphenhydrAMINE) suspension Swish and spit 5 mLs 3 (three) times daily. 150 mL 0   pantoprazole (PROTONIX) 40 MG tablet Take 1 tablet (40 mg total) by mouth 2 (two) times daily before a meal. 60 tablet 12   sucralfate (CARAFATE) 1 g tablet Take 1 tablet (1 g total) by mouth 2 (two) times daily. 60 tablet 0   traZODone (DESYREL) 100 MG tablet Take 2 tablets (200 mg total) by mouth at bedtime as needed. 180 tablet 3   No facility-administered medications prior to visit.    Allergies  Allergen Reactions   Meperidine Hives   5-Alpha Reductase Inhibitors Other (See Comments)    unknown    Review of Systems     Objective:    Physical Exam  There were no vitals taken for this visit. Wt Readings from Last 3 Encounters:  11/29/22 172 lb 3.2 oz (78.1 kg)  11/11/22 166 lb 1.3 oz (75.3 kg)  10/27/22 167 lb 9.6 oz (76 kg)         Assessment & Plan:   Problem List Items Addressed This Visit   None    No orders of the defined types were placed in this encounter.    Anabel Halon, MD

## 2022-12-16 NOTE — Assessment & Plan Note (Signed)
She has mild dizziness upon sudden head movement No visual disturbance for focal neurologic deficit now Appears to have postconcussion symptoms, although improving Advised to avoid sudden positional changes Maintain adequate hydration and eat at regular intervals If any new focal neurologic symptom, advised to get immediate medical attention

## 2022-12-16 NOTE — Assessment & Plan Note (Addendum)
ER chart reviewed, including imaging Likely mechanical fall Had head injury, but did not have LOC CT head unremarkable

## 2022-12-16 NOTE — Patient Instructions (Signed)
Please get immediate medical attention if you have worsening of dizziness, headache or new onset numbness or tingling of upper or lower extremity.

## 2022-12-25 ENCOUNTER — Ambulatory Visit
Admission: EM | Admit: 2022-12-25 | Discharge: 2022-12-25 | Disposition: A | Discharge status: Home / Self Care | Payer: Medicare Other | Attending: Family Medicine | Admitting: Family Medicine

## 2022-12-25 DIAGNOSIS — B9789 Other viral agents as the cause of diseases classified elsewhere: Secondary | ICD-10-CM | POA: Diagnosis not present

## 2022-12-25 DIAGNOSIS — Z1152 Encounter for screening for COVID-19: Secondary | ICD-10-CM | POA: Diagnosis not present

## 2022-12-25 DIAGNOSIS — J069 Acute upper respiratory infection, unspecified: Secondary | ICD-10-CM | POA: Insufficient documentation

## 2022-12-25 DIAGNOSIS — R059 Cough, unspecified: Secondary | ICD-10-CM | POA: Insufficient documentation

## 2022-12-25 LAB — POCT INFLUENZA A/B
Influenza A, POC: NEGATIVE
Influenza B, POC: NEGATIVE

## 2022-12-25 MED ORDER — PROMETHAZINE-DM 6.25-15 MG/5ML PO SYRP: 5.0000 mL | ORAL_SOLUTION | Freq: Four times a day (QID) | ORAL | 0 refills | Status: DC | PRN

## 2022-12-25 MED ORDER — FLUTICASONE PROPIONATE 50 MCG/ACT NA SUSP: 1.0000 | Freq: Two times a day (BID) | NASAL | 2 refills | Status: DC

## 2022-12-25 NOTE — ED Triage Notes (Signed)
Pt presents to the office for cough and congestion. Pt reports body aches and low grade temp.

## 2022-12-25 NOTE — Discharge Instructions (Signed)
Your flu test was negative, your COVID test is pending.  If positive, someone will call you to discuss these results.  I have sent over a good cough syrup and steroid nasal spray to help with your symptoms and you may take over-the-counter cold and congestion medications, Tylenol and stay well-hydrated.

## 2022-12-26 LAB — SARS CORONAVIRUS 2 (TAT 6-24 HRS): SARS Coronavirus 2: NEGATIVE

## 2022-12-27 ENCOUNTER — Ambulatory Visit: Payer: Medicare Other | Admitting: Internal Medicine

## 2022-12-29 ENCOUNTER — Ambulatory Visit (INDEPENDENT_AMBULATORY_CARE_PROVIDER_SITE_OTHER): Payer: Medicare Other | Admitting: Internal Medicine

## 2022-12-29 ENCOUNTER — Encounter: Payer: Self-pay | Admitting: Internal Medicine

## 2022-12-29 VITALS — BP 136/79 | HR 85 | Ht 67.0 in | Wt 171.0 lb

## 2022-12-29 DIAGNOSIS — J0110 Acute frontal sinusitis, unspecified: Secondary | ICD-10-CM | POA: Insufficient documentation

## 2022-12-29 DIAGNOSIS — B349 Viral infection, unspecified: Secondary | ICD-10-CM | POA: Diagnosis not present

## 2022-12-29 MED ORDER — AMOXICILLIN-POT CLAVULANATE 875-125 MG PO TABS
1.0000 | ORAL_TABLET | Freq: Two times a day (BID) | ORAL | 0 refills | Status: DC
Start: 2022-12-29 — End: 2023-03-10

## 2022-12-29 NOTE — ED Provider Notes (Signed)
[facility]    CSN: [id number] Arrival date & time: 12/25/22  1343      History   Chief Complaint Chief Complaint  Patient presents with   Headache   Cough    HPI [name] is a 67 y.o. female.   Presenting today with several day history of cough, congestion, body aches, low grade fever, fatigue. Denies CP, SOB, abdominal pain, N/V/D. So far trying OTC remedies with minimal relief. No known hx of pulmonary dz.     Past Medical History:  Diagnosis Date   Anxiety    Anxiety state 01/01/2018   Bipolar 1 disorder (HCC)    Bipolar disorder (HCC) 12/12/2013   Depression    Diverticulitis    Diverticulitis of colon 03/15/2016   Hyperlipidemia 06/12/2020   Insomnia 01/01/2018   Tobacco abuse 06/25/2016   Tubular adenoma 10/02/2018    Patient Active Problem List   Diagnosis Date Noted   Acute non-recurrent frontal sinusitis 12/29/2022   Fall 12/13/2022   Post concussion syndrome 12/13/2022   Oral thrush 11/29/2022   Atrophic glossitis 11/29/2022   Glossitis 11/11/2022   Palpitations 10/13/2022   Medial epicondylitis of elbow, left 02/10/2022   IBS (irritable bowel syndrome) 08/31/2021   Polyp of duodenum 06/26/2021   Bloating 04/14/2021   LLQ pain 04/14/2021   Gastroesophageal reflux disease without esophagitis 04/01/2021   Hyperlipidemia 06/12/2020   B12 deficiency 05/14/2020   Vitamin D deficiency 05/14/2020   Screening due 05/14/2020   Sensory seizure (HCC) 01/02/2020   History of sensory changes 01/02/2020   Fatigue 01/02/2020   Tremor of both hands 01/02/2020   History of brain concussion 01/02/2020   Enteritis 09/17/2018   Depressed bipolar I disorder in partial remission (HCC) 01/01/2018   Anxiety 01/01/2018    Past Surgical History:  Procedure Laterality Date   BIOPSY  05/15/2021   Procedure: BIOPSY;  Surgeon: Dolores Frame, MD;  Location: [facility];  Service: Gastroenterology;;   BIOPSY  08/24/2021   Procedure:  BIOPSY;  Surgeon: Lemar Lofty., MD;  Location: [facility];  Service: Gastroenterology;;   BIOPSY  08/12/2022   Procedure: BIOPSY;  Surgeon: Lemar Lofty., MD;  Location: [facility];  Service: Gastroenterology;;   COLONOSCOPY     COLONOSCOPY N/A 08/08/2019   Procedure: COLONOSCOPY;  Surgeon: Malissa Hippo, MD;  [facility]: [facility];  Service: Endoscopy;  Laterality: N/A;  220   COLONOSCOPY N/A 08/12/2022   Procedure: COLONOSCOPY;  Surgeon: Meridee Score Netty Starring., MD;  Location: [facility];  Service: Gastroenterology;  Laterality: N/A;   COLONOSCOPY WITH PROPOFOL N/A 05/15/2021   large ulcer at IC valve with negative biopsies for dysplasia. two tubular adenomas. Recommending surveillance in 6 months to assess ulcer healing. Patient prefers to undergo this at time of EGD for history of duodenal adenoma.   ENDOSCOPIC MUCOSAL RESECTION  08/24/2021   Dr. Meridee Score: duodenal polyp s/p piecemeal removal, path tubulovillous adenoma. Repeat in 9-12 months.   ENDOSCOPIC MUCOSAL RESECTION  08/12/2022   Procedure: ENDOSCOPIC MUCOSAL RESECTION;  Surgeon: Meridee Score Netty Starring., MD;  Location: [facility];  Service: Gastroenterology;;   ENTEROSCOPY N/A 08/12/2022   Procedure: ENTEROSCOPY;  Surgeon: Lemar Lofty., MD;  Location: [facility];  Service: Gastroenterology;  Laterality: N/A;   ESOPHAGOGASTRODUODENOSCOPY (EGD) WITH PROPOFOL N/A 05/15/2021   negative H.pylori, duodenal adenoma. Referred for EMR.   ESOPHAGOGASTRODUODENOSCOPY (EGD) WITH PROPOFOL N/A 08/24/2021   Procedure: ESOPHAGOGASTRODUODENOSCOPY (EGD) WITH PROPOFOL;  Surgeon: Lemar Lofty., MD;  Location:  [facility];  Service: Gastroenterology;  Laterality: N/A;   HEMOSTASIS CLIP PLACEMENT  08/24/2021   Procedure: HEMOSTASIS CLIP PLACEMENT;  Surgeon: Lemar Lofty., MD;  Location: [facility];  Service: Gastroenterology;;   HEMOSTASIS CLIP PLACEMENT  08/12/2022   Procedure:  HEMOSTASIS CLIP PLACEMENT;  Surgeon: Lemar Lofty., MD;  Location: [facility];  Service: Gastroenterology;;   HOT HEMOSTASIS N/A 08/24/2021   Procedure: HOT HEMOSTASIS (ARGON PLASMA COAGULATION/BICAP);  Surgeon: Lemar Lofty., MD;  Location: [facility];  Service: Gastroenterology;  Laterality: N/A;   HOT HEMOSTASIS N/A 08/12/2022   Procedure: HOT HEMOSTASIS (ARGON PLASMA COAGULATION/BICAP);  Surgeon: Lemar Lofty., MD;  Location: [facility];  Service: Gastroenterology;  Laterality: N/A;   oophrorectomy     POLYPECTOMY  08/08/2019   Procedure: POLYPECTOMY;  Surgeon: Malissa Hippo, MD;  [facility];  Service: Endoscopy;;   POLYPECTOMY  05/15/2021   Procedure: POLYPECTOMY;  Surgeon: Dolores Frame, MD;  [facility];  Service: Gastroenterology;;   POLYPECTOMY  08/12/2022   Procedure: POLYPECTOMY;  Surgeon: Lemar Lofty., MD;  Location: [facility];  Service: Gastroenterology;;   SUBMUCOSAL LIFTING INJECTION  08/24/2021   Procedure: SUBMUCOSAL LIFTING INJECTION;  Surgeon: Lemar Lofty., MD;  Location: [facility];  Service: Gastroenterology;;   SUBMUCOSAL LIFTING INJECTION  08/12/2022   Procedure: SUBMUCOSAL LIFTING INJECTION;  Surgeon: Lemar Lofty., MD;  Location: [facility];  Service: Gastroenterology;;   SUBMUCOSAL TATTOO INJECTION  08/24/2021   Procedure: SUBMUCOSAL TATTOO INJECTION;  Surgeon: Lemar Lofty., MD;  Location: [facility];  Service: Gastroenterology;;   TUBAL LIGATION      OB History     Gravida      Para      Term      Preterm      AB      Living  2      SAB      IAB      Ectopic      Multiple      Live Births               Home Medications    Prior to Admission medications   Medication Sig Start Date End Date Taking? Authorizing Provider  ALPRAZolam (XANAX) 0.5 MG tablet TAKE 1 TABLET(0.5 MG) BY MOUTH THREE TIMES DAILY AS NEEDED  FOR ANXIETY 09/14/22  Yes Hurst, Teresa T, PA-C  busPIRone (BUSPAR) 15 MG tablet TAKE 1 TABLET(15 MG) BY MOUTH TWICE DAILY 09/14/22  Yes Hurst, Teresa T, PA-C  dicyclomine (BENTYL) 20 MG tablet TAKE 1 TABLET(20 MG) BY MOUTH THREE TIMES DAILY AS NEEDED FOR ABDOMINAL PAIN 04/26/22  Yes Marguerita Merles, Reuel Boom, MD  fluticasone (FLONASE) 50 MCG/ACT nasal spray Place 1 spray into both nostrils 2 (two) times daily. 12/25/22  Yes Particia Nearing, PA-C  lamoTRIgine (LAMICTAL) 200 MG tablet TAKE 1 TABLET(200 MG) BY MOUTH TWICE DAILY 09/14/22  Yes Hurst, Teresa T, PA-C  promethazine-dextromethorphan (PROMETHAZINE-DM) 6.25-15 MG/5ML syrup Take 5 mLs by mouth 4 (four) times daily as needed. 12/25/22  Yes Particia Nearing, PA-C  traZODone (DESYREL) 100 MG tablet Take 2 tablets (200 mg total) by mouth at bedtime as needed. 09/14/22  Yes Melony Overly T, PA-C  acetaminophen (TYLENOL) 500 MG tablet Take 500 mg by mouth every 6 (six) hours as needed for mild pain or moderate pain.    [provider]  amoxicillin-clavulanate (AUGMENTIN) 875-125 MG tablet Take 1 tablet by mouth 2 (two) times daily. 12/29/22  Anabel Halon, MD  magic mouthwash (nystatin, hydrocortisone, diphenhydrAMINE) suspension Swish and spit 5 mLs 3 (three) times daily. 11/24/22   Kerri Perches, MD  pantoprazole (PROTONIX) 40 MG tablet Take 1 tablet (40 mg total) by mouth 2 (two) times daily before a meal. 08/12/22   Mansouraty, Netty Starring., MD  sucralfate (CARAFATE) 1 g tablet Take 1 tablet (1 g total) by mouth 2 (two) times daily. 08/12/22   Mansouraty, Netty Starring., MD    Family History Family History  Problem Relation Age of Onset   Cancer Mother        lichen planus - oral cancer   Cancer Father        lung   Cancer Maternal Uncle        uterine   Diabetes Maternal Uncle    Stroke Maternal Grandmother    COPD Paternal Grandfather    Colon cancer Neg Hx    Esophageal cancer Neg Hx    Stomach cancer Neg Hx    Liver  cancer Neg Hx    Pancreatic cancer Neg Hx     Social History Social History   Tobacco Use   Smoking status: Former    Current packs/day: 0.00    Types: Cigarettes    Quit date: 08/19/2021    Years since quitting: 1.3   Smokeless tobacco: Never  Vaping Use   Vaping status: Never Used  Substance Use Topics   Alcohol use: Yes    Alcohol/week: 2.0 standard drinks of alcohol    Types: 2 Glasses of wine per week    Comment: socially   Drug use: No     Allergies   Meperidine and 5-alpha reductase inhibitors   Review of Systems Review of Systems PER HPI  Physical Exam Triage Vital Signs ED Triage Vitals  Encounter Vitals Group     BP 12/25/22 1444 96/66     Systolic BP Percentile --      Diastolic BP Percentile --      Pulse Rate 12/25/22 1444 72     Resp 12/25/22 1444 18     Temp 12/25/22 1444 97.8 F (36.6 C)     Temp Source 12/25/22 1444 Oral     SpO2 12/25/22 1444 94 %     Weight --      Height --      Head Circumference --      Peak Flow --      Pain Score 12/25/22 1455 0     Pain Loc --      Pain Education --      Exclude from Growth Chart --    No data found.  Updated Vital Signs BP 96/66 (BP Location: Left Arm)   Pulse 72   Temp 97.8 F (36.6 C) (Oral)   Resp 18   SpO2 94%   Visual Acuity Right Eye Distance:   Left Eye Distance:   Bilateral Distance:    Right Eye Near:   Left Eye Near:    Bilateral Near:     Physical Exam Vitals and nursing note reviewed.  Constitutional:      Appearance: Normal appearance.  HENT:     Head: Atraumatic.     Right Ear: Tympanic membrane and external ear normal.     Left Ear: Tympanic membrane and external ear normal.     Nose: Rhinorrhea present.     Mouth/Throat:     Mouth: Mucous membranes are moist.     Pharynx: Posterior oropharyngeal  erythema present.  Eyes:     Extraocular Movements: Extraocular movements intact.     Conjunctiva/sclera: Conjunctivae normal.  Cardiovascular:     Rate and  Rhythm: Normal rate and regular rhythm.     Heart sounds: Normal heart sounds.  Pulmonary:     Effort: Pulmonary effort is normal.     Breath sounds: Normal breath sounds. No wheezing.  Musculoskeletal:        General: Normal range of motion.     Cervical back: Normal range of motion and neck supple.  Skin:    General: Skin is warm and dry.  Neurological:     Mental Status: She is alert and oriented to person, place, and time.  Psychiatric:        Mood and Affect: Mood normal.        Thought Content: Thought content normal.      UC Treatments / Results  Labs (all labs ordered are listed, but only abnormal results are displayed) Labs Reviewed  SARS CORONAVIRUS 2 (TAT 6-24 HRS)  POCT INFLUENZA A/B    EKG   Radiology No results found.  Procedures Procedures (including critical care time)  Medications Ordered in UC Medications - No data to display  Initial Impression / Assessment and Plan / UC Course  I have reviewed the triage vital signs and the nursing notes.  Pertinent labs & imaging results that were available during my care of the patient were reviewed by me and considered in my medical decision making (see chart for details).     Vitals and exam reassuring and suspicious for viral URI. Treat with phenergan dm, flonase, supportive OTC medications and home care. Rapid flu neg, COVID pcr pending. Return for worsening sxs.  Final Clinical Impressions(s) / UC Diagnoses   Final diagnoses:  Viral URI with cough     Discharge Instructions      Your flu test was negative, your COVID test is pending.  If positive, someone will call you to discuss these results.  I have sent over a good cough syrup and steroid nasal spray to help with your symptoms and you may take over-the-counter cold and congestion medications, Tylenol and stay well-hydrated.    ED Prescriptions     Medication Sig Dispense Auth. Provider   fluticasone (FLONASE) 50 MCG/ACT nasal spray Place  1 spray into both nostrils 2 (two) times daily. 16 g Roosvelt Maser [place], [place]   promethazine-dextromethorphan (PROMETHAZINE-DM) 6.25-15 MG/5ML syrup Take 5 mLs by mouth 4 (four) times daily as needed. 100 mL Particia Nearing, [place]      PDMP not reviewed this encounter.   Particia Nearing, [place] 12/29/22 2310

## 2022-12-29 NOTE — Progress Notes (Signed)
Acute Office Visit  Subjective:    Patient ID: [name], female    DOB: 1955/12/13, 67 y.o.   MRN: [id number]  Chief Complaint  Patient presents with   URI    URI , went to urgent care on Saturday tested negative for covid and flu, patient still can not taste or smell   blood in mouth     Blood in mouth     HPI Patient is in today for Complaint of persistent nasal congestion, sinus pressure related headache and facial pain, chills and fatigue  for the last 1 week.  She also reports cough and loss of smell and taste sensation.  She had negative COVID testing at urgent care on 12/25/22.  Due to her persistent symptoms, her workplace surgeon suggested her to get repeat COVID testing.  She denies any dyspnea or wheezing currently.  Past Medical History:  Diagnosis Date   Anxiety    Anxiety state 01/01/2018   Bipolar 1 disorder (HCC)    Bipolar disorder (HCC) 12/12/2013   Depression    Diverticulitis    Diverticulitis of colon 03/15/2016   Hyperlipidemia 06/12/2020   Insomnia 01/01/2018   Tobacco abuse 06/25/2016   Tubular adenoma 10/02/2018    Past Surgical History:  Procedure Laterality Date   BIOPSY  05/15/2021   Procedure: BIOPSY;  Surgeon: Dolores Frame, MD;  Location: [facility];  Service: Gastroenterology;;   BIOPSY  08/24/2021   Procedure: BIOPSY;  Surgeon: Lemar Lofty., MD;  Location: [facility];  Service: Gastroenterology;;   BIOPSY  08/12/2022   Procedure: BIOPSY;  Surgeon: Lemar Lofty., MD;  Location: [facility];  Service: Gastroenterology;;   COLONOSCOPY     COLONOSCOPY N/A 08/08/2019   Procedure: COLONOSCOPY;  Surgeon: Malissa Hippo, MD;  Location: [facility];  Service: Endoscopy;  Laterality: N/A;  220   COLONOSCOPY N/A 08/12/2022   Procedure: COLONOSCOPY;  Surgeon: Meridee Score Netty Starring., MD;  Location: [facility];  Service: Gastroenterology;  Laterality: N/A;   COLONOSCOPY WITH PROPOFOL N/A 05/15/2021    large ulcer at IC valve with negative biopsies for dysplasia. two tubular adenomas. Recommending surveillance in 6 months to assess ulcer healing. Patient prefers to undergo this at time of EGD for history of duodenal adenoma.   ENDOSCOPIC MUCOSAL RESECTION  08/24/2021   Dr. Meridee Score: duodenal polyp s/p piecemeal removal, path tubulovillous adenoma. Repeat in 9-12 months.   ENDOSCOPIC MUCOSAL RESECTION  08/12/2022   Procedure: ENDOSCOPIC MUCOSAL RESECTION;  Surgeon: Meridee Score Netty Starring., MD;  Location: [facility];  Service: Gastroenterology;;   ENTEROSCOPY N/A 08/12/2022   Procedure: ENTEROSCOPY;  Surgeon: Lemar Lofty., MD;  Location: [facility];  Service: Gastroenterology;  Laterality: N/A;   ESOPHAGOGASTRODUODENOSCOPY (EGD) WITH PROPOFOL N/A 05/15/2021   negative H.pylori, duodenal adenoma. Referred for EMR.   ESOPHAGOGASTRODUODENOSCOPY (EGD) WITH PROPOFOL N/A 08/24/2021   Procedure: ESOPHAGOGASTRODUODENOSCOPY (EGD) WITH PROPOFOL;  Surgeon: Meridee Score Netty Starring., MD;  Location: [facility];  Service: Gastroenterology;  Laterality: N/A;   HEMOSTASIS CLIP PLACEMENT  08/24/2021   Procedure: HEMOSTASIS CLIP PLACEMENT;  Surgeon: Lemar Lofty., MD;  Location: [facility];  Service: Gastroenterology;;   HEMOSTASIS CLIP PLACEMENT  08/12/2022   Procedure: HEMOSTASIS CLIP PLACEMENT;  Surgeon: Lemar Lofty., MD;  Location: [facility];  Service: Gastroenterology;;   HOT HEMOSTASIS N/A 08/24/2021   Procedure: HOT HEMOSTASIS (ARGON PLASMA COAGULATION/BICAP);  Surgeon: Lemar Lofty., MD;  Location: [facility];  Service: Gastroenterology;  Laterality: N/A;   HOT HEMOSTASIS  N/A 08/12/2022   Procedure: HOT HEMOSTASIS (ARGON PLASMA COAGULATION/BICAP);  Surgeon: Lemar Lofty., MD;  Location: [facility];  Service: Gastroenterology;  Laterality: N/A;   oophrorectomy     POLYPECTOMY  08/08/2019   Procedure: POLYPECTOMY;  Surgeon: Malissa Hippo,  MD;  Location: [facility];  Service: Endoscopy;;   POLYPECTOMY  05/15/2021   Procedure: POLYPECTOMY;  Surgeon: Dolores Frame, MD;  [facility];  Service: Gastroenterology;;   POLYPECTOMY  08/12/2022   Procedure: POLYPECTOMY;  Surgeon: Lemar Lofty., MD;  Location: [facility];  Service: Gastroenterology;;   SUBMUCOSAL LIFTING INJECTION  08/24/2021   Procedure: SUBMUCOSAL LIFTING INJECTION;  Surgeon: Lemar Lofty., MD;  Location: [facility];  Service: Gastroenterology;;   SUBMUCOSAL LIFTING INJECTION  08/12/2022   Procedure: SUBMUCOSAL LIFTING INJECTION;  Surgeon: Lemar Lofty., MD;  Location: [facility];  Service: Gastroenterology;;   SUBMUCOSAL TATTOO INJECTION  08/24/2021   Procedure: SUBMUCOSAL TATTOO INJECTION;  Surgeon: Lemar Lofty., MD;  Location: [facility];  Service: Gastroenterology;;   TUBAL LIGATION      Family History  Problem Relation Age of Onset   Cancer Mother        lichen planus - oral cancer   Cancer Father        lung   Cancer Maternal Uncle        uterine   Diabetes Maternal Uncle    Stroke Maternal Grandmother    COPD Paternal Grandfather    Colon cancer Neg Hx    Esophageal cancer Neg Hx    Stomach cancer Neg Hx    Liver cancer Neg Hx    Pancreatic cancer Neg Hx     Social History   Socioeconomic History   Marital status: Divorced    Spouse name: Not on file   Number of children: 2   Years of education: 13   Highest education level: Not on file  Occupational History   Occupation: Manufacturing engineer    Comment: [employer], [place]  Tobacco Use   Smoking status: Former    Current packs/day: 0.00    Types: Cigarettes    Quit date: 08/19/2021    Years since quitting: 1.3   Smokeless tobacco: Never  Vaping Use   Vaping status: Never Used  Substance and Sexual Activity   Alcohol use: Yes    Alcohol/week: 2.0 standard drinks of alcohol    Types: 2 Glasses of wine  per week    Comment: socially   Drug use: No   Sexual activity: Not Currently    Birth control/protection: Post-menopausal  Other Topics Concern   Not on file  Social History Narrative   Lives alone   She has 2 sons that are close by.      She enjoys going out to dinner and spend time with family.      Diet reports eating all foods.   Caffeine none   Water 6 to 8 cups regularly daily.      Social Determinants of Health   Financial Resource Strain: Not on file  Food Insecurity: Not on file  Transportation Needs: Not on file  Physical Activity: Not on file  Stress: Not on file  Social Connections: Not on file  Intimate Partner Violence: Not At Risk (02/09/2022)   Received from [facility]   Humiliation, Afraid, Rape, and Kick questionnaire    Fear of Current or Ex-Partner: No    Emotionally Abused: No  Physically Abused: No    Sexually Abused: No    Outpatient Medications Prior to Visit  Medication Sig Dispense Refill   acetaminophen (TYLENOL) 500 MG tablet Take 500 mg by mouth every 6 (six) hours as needed for mild pain or moderate pain.     ALPRAZolam (XANAX) 0.5 MG tablet TAKE 1 TABLET(0.5 MG) BY MOUTH THREE TIMES DAILY AS NEEDED FOR ANXIETY 90 tablet 5   busPIRone (BUSPAR) 15 MG tablet TAKE 1 TABLET(15 MG) BY MOUTH TWICE DAILY 180 tablet 3   dicyclomine (BENTYL) 20 MG tablet TAKE 1 TABLET(20 MG) BY MOUTH THREE TIMES DAILY AS NEEDED FOR ABDOMINAL PAIN 30 tablet 1   fluticasone (FLONASE) 50 MCG/ACT nasal spray Place 1 spray into both nostrils 2 (two) times daily. 16 g 2   lamoTRIgine (LAMICTAL) 200 MG tablet TAKE 1 TABLET(200 MG) BY MOUTH TWICE DAILY 180 tablet 3   magic mouthwash (nystatin, hydrocortisone, diphenhydrAMINE) suspension Swish and spit 5 mLs 3 (three) times daily. 150 mL 0   pantoprazole (PROTONIX) 40 MG tablet Take 1 tablet (40 mg total) by mouth 2 (two) times daily before a meal. 60 tablet 12   promethazine-dextromethorphan  (PROMETHAZINE-DM) 6.25-15 MG/5ML syrup Take 5 mLs by mouth 4 (four) times daily as needed. 100 mL 0   sucralfate (CARAFATE) 1 g tablet Take 1 tablet (1 g total) by mouth 2 (two) times daily. 60 tablet 0   traZODone (DESYREL) 100 MG tablet Take 2 tablets (200 mg total) by mouth at bedtime as needed. 180 tablet 3   No facility-administered medications prior to visit.    Allergies  Allergen Reactions   Meperidine Hives   5-Alpha Reductase Inhibitors Other (See Comments)    unknown    Review of Systems  Constitutional:  Positive for fatigue. Negative for chills and fever.  HENT:  Positive for congestion, postnasal drip, sinus pressure, sinus pain and sore throat.   Eyes:  Negative for pain and discharge.  Respiratory:  Positive for cough. Negative for shortness of breath.   Cardiovascular:  Negative for chest pain and palpitations.  Gastrointestinal:  Negative for abdominal pain, diarrhea, nausea and vomiting.  Endocrine: Negative for polydipsia and polyuria.  Genitourinary:  Negative for dysuria and hematuria.  Musculoskeletal:  Negative for neck pain and neck stiffness.  Skin:  Negative for rash.  Neurological:  Negative for dizziness and weakness.  Psychiatric/Behavioral:  Negative for agitation and behavioral problems.        Objective:    Physical Exam Vitals reviewed.  Constitutional:      General: She is not in acute distress.    Appearance: She is not diaphoretic.  HENT:     Nose: Congestion present.     Right Sinus: Maxillary sinus tenderness and frontal sinus tenderness present.     Left Sinus: Maxillary sinus tenderness and frontal sinus tenderness present.     Mouth/Throat:     Mouth: Mucous membranes are moist.     Tongue: No lesions.     Pharynx: No posterior oropharyngeal erythema.  Eyes:     General: No scleral icterus.    Extraocular Movements: Extraocular movements intact.  Cardiovascular:     Rate and Rhythm: Normal rate and regular rhythm.     Pulses:  Normal pulses.     Heart sounds: Normal heart sounds. No murmur heard. Pulmonary:     Breath sounds: Normal breath sounds. No wheezing or rales.  Musculoskeletal:     Cervical back: Neck supple. No tenderness.  Right lower leg: No edema.     Left lower leg: No edema.  Skin:    General: Skin is warm.     Findings: No rash.  Neurological:     General: No focal deficit present.     Mental Status: She is alert and oriented to person, place, and time.  Psychiatric:        Mood and Affect: Mood normal.        Behavior: Behavior normal.     BP 136/79 (BP Location: Right Arm, Patient Position: Sitting, Cuff Size: Normal)   Pulse 85   Ht 5\' 7"  (1.702 m)   Wt 171 lb (77.6 kg)   SpO2 94%   BMI 26.78 kg/m  Wt Readings from Last 3 Encounters:  12/29/22 171 lb (77.6 kg)  11/29/22 172 lb 3.2 oz (78.1 kg)  11/11/22 166 lb 1.3 oz (75.3 kg)        Assessment & Plan:   Problem List Items Addressed This Visit       Respiratory   Acute non-recurrent frontal sinusitis - Primary    Likely has acute frontal and maxillary sinusitis Check COVID, flu and RSV tests - requests from workplace, although as her symptoms have been for a week now, would not change management Started empiric Augmentin considering persistent symptoms despite symptomatic treatment with Flonase and Promethazine DM syrup Advised to use humidifier and/or vaporizer      Relevant Medications   amoxicillin-clavulanate (AUGMENTIN) 875-125 MG tablet   Other Relevant Orders   COVID-19, Flu A+B and RSV     Meds ordered this encounter  Medications   amoxicillin-clavulanate (AUGMENTIN) 875-125 MG tablet    Sig: Take 1 tablet by mouth 2 (two) times daily.    Dispense:  14 tablet    Refill:  0     Jamaya Sleeth Concha Se, MD

## 2022-12-29 NOTE — Patient Instructions (Signed)
Please start taking Augmentin as prescribed.  Please start taking Vitamin C 500 mcg once daily for gum bleeding.

## 2022-12-29 NOTE — Assessment & Plan Note (Signed)
Likely has acute frontal and maxillary sinusitis Check COVID, flu and RSV tests - requests from workplace, although as her symptoms have been for a week now, would not change management Started empiric Augmentin considering persistent symptoms despite symptomatic treatment with Flonase and Promethazine DM syrup Advised to use humidifier and/or vaporizer

## 2022-12-31 LAB — COVID-19, FLU A+B AND RSV
Influenza A, NAA: NOT DETECTED
Influenza B, NAA: NOT DETECTED
RSV, NAA: NOT DETECTED
SARS-CoV-2, NAA: NOT DETECTED

## 2023-01-26 DIAGNOSIS — M9902 Segmental and somatic dysfunction of thoracic region: Secondary | ICD-10-CM | POA: Diagnosis not present

## 2023-01-26 DIAGNOSIS — M9905 Segmental and somatic dysfunction of pelvic region: Secondary | ICD-10-CM | POA: Diagnosis not present

## 2023-01-26 DIAGNOSIS — M546 Pain in thoracic spine: Secondary | ICD-10-CM | POA: Diagnosis not present

## 2023-01-26 DIAGNOSIS — M62830 Muscle spasm of back: Secondary | ICD-10-CM | POA: Diagnosis not present

## 2023-01-26 DIAGNOSIS — M25561 Pain in right knee: Secondary | ICD-10-CM | POA: Diagnosis not present

## 2023-01-26 DIAGNOSIS — M9903 Segmental and somatic dysfunction of lumbar region: Secondary | ICD-10-CM | POA: Diagnosis not present

## 2023-01-28 DIAGNOSIS — M9905 Segmental and somatic dysfunction of pelvic region: Secondary | ICD-10-CM | POA: Diagnosis not present

## 2023-01-28 DIAGNOSIS — M9903 Segmental and somatic dysfunction of lumbar region: Secondary | ICD-10-CM | POA: Diagnosis not present

## 2023-01-28 DIAGNOSIS — M25561 Pain in right knee: Secondary | ICD-10-CM | POA: Diagnosis not present

## 2023-01-28 DIAGNOSIS — M546 Pain in thoracic spine: Secondary | ICD-10-CM | POA: Diagnosis not present

## 2023-01-28 DIAGNOSIS — M62830 Muscle spasm of back: Secondary | ICD-10-CM | POA: Diagnosis not present

## 2023-01-28 DIAGNOSIS — M9902 Segmental and somatic dysfunction of thoracic region: Secondary | ICD-10-CM | POA: Diagnosis not present

## 2023-02-11 ENCOUNTER — Encounter (HOSPITAL_BASED_OUTPATIENT_CLINIC_OR_DEPARTMENT_OTHER): Payer: Self-pay

## 2023-02-11 ENCOUNTER — Ambulatory Visit (HOSPITAL_BASED_OUTPATIENT_CLINIC_OR_DEPARTMENT_OTHER): Admit: 2023-02-11 | Payer: Medicare Other | Admitting: Plastic Surgery

## 2023-02-11 SURGERY — MAMMOPLASTY, REDUCTION
Anesthesia: General | Site: Breast | Laterality: Bilateral

## 2023-02-21 DIAGNOSIS — M25561 Pain in right knee: Secondary | ICD-10-CM | POA: Diagnosis not present

## 2023-02-21 DIAGNOSIS — M62830 Muscle spasm of back: Secondary | ICD-10-CM | POA: Diagnosis not present

## 2023-02-21 DIAGNOSIS — M546 Pain in thoracic spine: Secondary | ICD-10-CM | POA: Diagnosis not present

## 2023-02-21 DIAGNOSIS — M9903 Segmental and somatic dysfunction of lumbar region: Secondary | ICD-10-CM | POA: Diagnosis not present

## 2023-02-21 DIAGNOSIS — M9905 Segmental and somatic dysfunction of pelvic region: Secondary | ICD-10-CM | POA: Diagnosis not present

## 2023-02-21 DIAGNOSIS — M9902 Segmental and somatic dysfunction of thoracic region: Secondary | ICD-10-CM | POA: Diagnosis not present

## 2023-02-23 DIAGNOSIS — I788 Other diseases of capillaries: Secondary | ICD-10-CM | POA: Diagnosis not present

## 2023-02-23 DIAGNOSIS — M9903 Segmental and somatic dysfunction of lumbar region: Secondary | ICD-10-CM | POA: Diagnosis not present

## 2023-02-23 DIAGNOSIS — M62830 Muscle spasm of back: Secondary | ICD-10-CM | POA: Diagnosis not present

## 2023-02-23 DIAGNOSIS — C44622 Squamous cell carcinoma of skin of right upper limb, including shoulder: Secondary | ICD-10-CM | POA: Diagnosis not present

## 2023-02-23 DIAGNOSIS — L570 Actinic keratosis: Secondary | ICD-10-CM | POA: Diagnosis not present

## 2023-02-23 DIAGNOSIS — M9902 Segmental and somatic dysfunction of thoracic region: Secondary | ICD-10-CM | POA: Diagnosis not present

## 2023-02-23 DIAGNOSIS — M25561 Pain in right knee: Secondary | ICD-10-CM | POA: Diagnosis not present

## 2023-02-23 DIAGNOSIS — M9905 Segmental and somatic dysfunction of pelvic region: Secondary | ICD-10-CM | POA: Diagnosis not present

## 2023-02-23 DIAGNOSIS — M546 Pain in thoracic spine: Secondary | ICD-10-CM | POA: Diagnosis not present

## 2023-03-08 ENCOUNTER — Ambulatory Visit: Payer: Self-pay | Admitting: Internal Medicine

## 2023-03-08 NOTE — Telephone Encounter (Addendum)
 Copied from CRM [id number]. Topic: Clinical - Red Word Triage >> Mar 08, 2023 12:26 PM Graeme ORN wrote: Red Word that prompted transfer to Nurse Triage: dizzy spells, room spins when she lays down at night, off balance and nausea   Chief Complaint: Dizziness Symptoms: Lightheadedness and nausea Frequency: Since Friday night Pertinent Negatives: Patient denies relief and vomiting Disposition: [] ED /[] Urgent Care (no appt availability in office) / [x] Appointment(In office/virtual)/ []  [facility]/ [] Home Care/ [] Refused Recommended Disposition /[]  Mobile Bus/ []  Follow-up with PCP Additional Notes: Patient called in complaining of dizziness that started Friday night. Patient stated that it started off feeling like the room was spinning, but has now progressed to general lightheadedness and wooziness. Patient stated that she has had vertigo in the past, but these symptoms feel different. Patient stated that she has a history of low BP and is wondering if this is related to that. Patient unable to give me a BP reading at this time. Patient stated that she is still able to walk around normally, but sometimes she feels a bit off balance. Patient reported that turning her head and moving too quickly aggravate her symptoms. Patient reported that some nausea is present due to lightheadedness. Recommended in office appointment within 3 days. Scheduled at [facility] on 1/2, due to no availability at patient's current practice. Advised patient to rest, maintain appropriate food and fluid intake, and to call back if symptoms worsen.    Reason for Disposition  [1] MILD dizziness (e.g., walking normally) AND [2] has NOT been evaluated by doctor (or NP/PA) for this  (Exception: Dizziness caused by heat exposure, sudden standing, or poor fluid intake.)  Answer Assessment - Initial Assessment Questions 1. DESCRIPTION: Describe your dizziness.     Patient states she feels  off balance  2. LIGHTHEADED: Do you feel lightheaded? (e.g., somewhat faint, woozy, weak upon standing)     Today she started feeling lightheaded and woozy  4. SEVERITY: How bad is it?  Do you feel like you are going to faint? Can you stand and walk?   - MILD: Feels slightly dizzy, but walking normally.   - MODERATE: Feels unsteady when walking, but not falling; interferes with normal activities (e.g., school, work).   - SEVERE: Unable to walk without falling, or requires assistance to walk without falling; feels like passing out now.      Can walk around normally, but feels that balance is little bit off  5. ONSET:  When did the dizziness begin?     Friday night  6. AGGRAVATING FACTORS: Does anything make it worse? (e.g., standing, change in head position)     Turning head quickly and getting out of bed too fast   8. CAUSE: What do you think is causing the dizziness?     Low BP  9. RECURRENT SYMPTOM: Have you had dizziness before? If Yes, ask: When was the last time? What happened that time?     Patient stated that she has had vertigo in past, but the lightheadedness is a new symptom  10. OTHER SYMPTOMS: Do you have any other symptoms? (e.g., fever, chest pain, vomiting, diarrhea, bleeding)       Nausea due to lightheadedness  Protocols used: Dizziness - Lightheadedness-A-AH

## 2023-03-10 ENCOUNTER — Ambulatory Visit (INDEPENDENT_AMBULATORY_CARE_PROVIDER_SITE_OTHER): Payer: Medicare Other | Admitting: Physician Assistant

## 2023-03-10 ENCOUNTER — Encounter: Payer: Self-pay | Admitting: Physician Assistant

## 2023-03-10 VITALS — BP 118/74 | Ht 67.0 in | Wt 168.2 lb

## 2023-03-10 DIAGNOSIS — R42 Dizziness and giddiness: Secondary | ICD-10-CM | POA: Diagnosis not present

## 2023-03-10 DIAGNOSIS — R5383 Other fatigue: Secondary | ICD-10-CM | POA: Diagnosis not present

## 2023-03-10 LAB — POCT GLUCOSE FINGERSTICK: Glucose: 95 (ref 70–99)

## 2023-03-10 MED ORDER — MECLIZINE HCL 25 MG PO TABS: 25.0000 mg | ORAL_TABLET | Freq: Two times a day (BID) | ORAL | 0 refills | Status: AC | PRN

## 2023-03-10 NOTE — Progress Notes (Signed)
 Acute Office Visit  Subjective:     Patient ID: [name] Anon, female    DOB: 05-12-1955, 68 y.o.   MRN: [id number]   Dizziness Associated symptoms include headaches. Pertinent negatives include no chest pain, fever, nausea or weakness.   Patient is in today for concerns of dizziness. She states symptoms began Friday night when she got into bed. She reports feeling like the room was spinning or like she was on a boat.  She reports symptoms persisted through Saturday, but slightly improved Sunday and Monday.  She reports symptoms worsened again Wednesday and today feels off balance and lightheaded.  She states sudden movements make symptoms worse.  Patient relates a history of sensory seizures, and states current symptoms have similar qualities.  Patient admits dull headache associated with dizziness, but denies falls or loss of consciousness.  She states minor vision changes while watching TV and had some double vision while driving home from work, and report she has not driven since.  Patient denies concerns for anxiety, although voices anxiety has increased since symptom onset.  Review of Systems  Constitutional:  Positive for malaise/fatigue. Negative for fever.  HENT:  Negative for hearing loss and tinnitus.   Eyes:  Positive for double vision.  Respiratory:  Negative for shortness of breath and wheezing.   Cardiovascular:  Negative for chest pain and palpitations.  Gastrointestinal:  Negative for nausea.  Musculoskeletal:  Negative for falls.  Neurological:  Positive for dizziness and headaches. Negative for sensory change, loss of consciousness and weakness.        Objective:     BP 118/74 (Patient Position: Standing)   Ht 5' 7 (1.702 m)   Wt 168 lb 3.2 oz (76.3 kg)   BMI 26.34 kg/m   Physical Exam Constitutional:      Appearance: Normal appearance. She is normal weight.  HENT:     Head: Normocephalic.     Right Ear: Tympanic membrane normal.     Left Ear:  Tympanic membrane normal.     Nose: Nose normal. No congestion or rhinorrhea.     Mouth/Throat:     Mouth: Mucous membranes are moist.     Pharynx: Oropharynx is clear.  Eyes:     General: No visual field deficit.    Extraocular Movements: Extraocular movements intact.     Right eye: No nystagmus.     Left eye: No nystagmus.     Conjunctiva/sclera: Conjunctivae normal.  Neck:     Thyroid: No thyroid mass, thyromegaly or thyroid tenderness.  Cardiovascular:     Rate and Rhythm: Normal rate and regular rhythm.     Pulses: Normal pulses.     Heart sounds: Normal heart sounds. No murmur heard.    No gallop.  Pulmonary:     Effort: Pulmonary effort is normal.     Breath sounds: Normal breath sounds. No wheezing, rhonchi or rales.  Skin:    General: Skin is warm and dry.     Capillary Refill: Capillary refill takes less than 2 seconds.  Neurological:     General: No focal deficit present.     Mental Status: She is alert and oriented to person, place, and time.     Cranial Nerves: Cranial nerves 2-12 are intact. No facial asymmetry.     Sensory: Sensation is intact.     Motor: No weakness.     Gait: Gait is intact.  Psychiatric:        Mood and Affect: Mood normal.  Behavior: Behavior normal.     No results found for any visits on 03/10/23.      Assessment & Plan:  Vertigo  Dizziness -     Meclizine  HCl; Take 1 tablet (25 mg total) by mouth 2 (two) times daily as needed for dizziness.  Dispense: 30 tablet; Refill: 0 -     CBC with Differential/Platelet -     CMP14+EGFR -     TSH + free T4 -     POCT Glucose Fingerstick  Other fatigue   Patient appears stable today. Physical exam reassuring, no abnormal findings on neuro exam. POCT sugar 95. CBC, CMP, TSH/T4 today to rule out other causes for dizziness. Orthostatic blood pressure within normal range. I do not suspect acute pathology based on her symptoms today. Symptoms most consistent with vertigo at this time.  Meclizine  given today for symptomatic management of symptoms. Patient to follow up with PCP for further evaluation and management of dizziness.   Return if symptoms worsen or fail to improve.  Charmaine Cha [place], PA-C

## 2023-03-11 ENCOUNTER — Telehealth: Payer: Self-pay | Admitting: *Deleted

## 2023-03-11 ENCOUNTER — Encounter: Payer: Self-pay | Admitting: Physician Assistant

## 2023-03-11 LAB — CMP14+EGFR
ALT: 10 [IU]/L (ref 0–32)
AST: 16 [IU]/L (ref 0–40)
Albumin: 4.5 g/dL (ref 3.9–4.9)
Alkaline Phosphatase: 113 [IU]/L (ref 44–121)
BUN/Creatinine Ratio: 12 (ref 12–28)
BUN: 9 mg/dL (ref 8–27)
Bilirubin Total: 0.3 mg/dL (ref 0.0–1.2)
CO2: 24 mmol/L (ref 20–29)
Calcium: 9.9 mg/dL (ref 8.7–10.3)
Chloride: 102 mmol/L (ref 96–106)
Creatinine, Ser: 0.74 mg/dL (ref 0.57–1.00)
Globulin, Total: 2.2 g/dL (ref 1.5–4.5)
Glucose: 85 mg/dL (ref 70–99)
Potassium: 5.6 mmol/L — ABNORMAL HIGH (ref 3.5–5.2)
Sodium: 140 mmol/L (ref 134–144)
Total Protein: 6.7 g/dL (ref 6.0–8.5)
eGFR: 89 mL/min/{1.73_m2} (ref >59)

## 2023-03-11 LAB — CBC WITH DIFFERENTIAL/PLATELET
Basophils Absolute: 0.1 10*3/uL (ref 0.0–0.2)
Basos: 1 %
EOS (ABSOLUTE): 0.1 10*3/uL (ref 0.0–0.4)
Eos: 1 %
Hematocrit: 51 % — ABNORMAL HIGH (ref 34.0–46.6)
Hemoglobin: 16.4 g/dL — ABNORMAL HIGH (ref 11.1–15.9)
Immature Grans (Abs): 0 10*3/uL (ref 0.0–0.1)
Immature Granulocytes: 0 %
Lymphocytes Absolute: 1.3 10*3/uL (ref 0.7–3.1)
Lymphs: 12 %
MCH: 34 pg — ABNORMAL HIGH (ref 26.6–33.0)
MCHC: 32.2 g/dL (ref 31.5–35.7)
MCV: 106 fL — ABNORMAL HIGH (ref 79–97)
Monocytes Absolute: 0.8 10*3/uL (ref 0.1–0.9)
Monocytes: 8 %
Neutrophils Absolute: 7.9 10*3/uL — ABNORMAL HIGH (ref 1.4–7.0)
Neutrophils: 78 %
Platelets: 271 10*3/uL (ref 150–450)
RBC: 4.83 x10E6/uL (ref 3.77–5.28)
RDW: 11.5 % — ABNORMAL LOW (ref 11.7–15.4)
WBC: 10.3 10*3/uL (ref 3.4–10.8)

## 2023-03-11 LAB — TSH+FREE T4
Free T4: 1.28 ng/dL (ref 0.82–1.77)
TSH: 0.946 u[IU]/mL (ref 0.450–4.500)

## 2023-03-11 NOTE — Telephone Encounter (Signed)
 Grooms, [place], [place]     Please advise patient based on result management note. Thanks.

## 2023-03-11 NOTE — Telephone Encounter (Signed)
 Copied from CRM [id number]. Topic: Clinical - Lab/Test Results >> Mar 11, 2023  8:52 AM Alfonso ORN wrote: Reason for CRM: patient saw Charmaine yesterday have and would like a call have questions regarding her lab results showing some high number . Patient call back # [phone]

## 2023-03-11 NOTE — Telephone Encounter (Signed)
 Patient given results per provider via result note- see result note.

## 2023-03-14 ENCOUNTER — Ambulatory Visit: Payer: Self-pay | Admitting: Internal Medicine

## 2023-03-25 ENCOUNTER — Encounter: Payer: Self-pay | Admitting: Internal Medicine

## 2023-04-01 ENCOUNTER — Other Ambulatory Visit: Payer: Self-pay | Admitting: Physician Assistant

## 2023-04-01 NOTE — Telephone Encounter (Signed)
Please fill for [name] 12/12 lv 07/9 fu is being made due back in 6 months

## 2023-04-01 NOTE — Telephone Encounter (Signed)
Please schedule pt an appt lv 07/9 due back in 6 months.

## 2023-04-04 ENCOUNTER — Ambulatory Visit: Payer: Medicare Other | Admitting: Internal Medicine

## 2023-04-06 DIAGNOSIS — M62830 Muscle spasm of back: Secondary | ICD-10-CM | POA: Diagnosis not present

## 2023-04-06 DIAGNOSIS — M25561 Pain in right knee: Secondary | ICD-10-CM | POA: Diagnosis not present

## 2023-04-06 DIAGNOSIS — M546 Pain in thoracic spine: Secondary | ICD-10-CM | POA: Diagnosis not present

## 2023-04-06 DIAGNOSIS — M9902 Segmental and somatic dysfunction of thoracic region: Secondary | ICD-10-CM | POA: Diagnosis not present

## 2023-04-06 DIAGNOSIS — M9903 Segmental and somatic dysfunction of lumbar region: Secondary | ICD-10-CM | POA: Diagnosis not present

## 2023-04-06 DIAGNOSIS — M9905 Segmental and somatic dysfunction of pelvic region: Secondary | ICD-10-CM | POA: Diagnosis not present

## 2023-04-08 NOTE — Telephone Encounter (Signed)
LVM for pt to call and schedule follow up appt

## 2023-04-20 ENCOUNTER — Other Ambulatory Visit (HOSPITAL_COMMUNITY): Payer: Self-pay | Admitting: Family Medicine

## 2023-04-20 DIAGNOSIS — E785 Hyperlipidemia, unspecified: Secondary | ICD-10-CM | POA: Diagnosis not present

## 2023-04-20 DIAGNOSIS — Z79899 Other long term (current) drug therapy: Secondary | ICD-10-CM | POA: Diagnosis not present

## 2023-04-20 DIAGNOSIS — R42 Dizziness and giddiness: Secondary | ICD-10-CM | POA: Diagnosis not present

## 2023-04-20 DIAGNOSIS — K317 Polyp of stomach and duodenum: Secondary | ICD-10-CM | POA: Diagnosis not present

## 2023-04-20 DIAGNOSIS — R110 Nausea: Secondary | ICD-10-CM | POA: Diagnosis not present

## 2023-04-20 DIAGNOSIS — K219 Gastro-esophageal reflux disease without esophagitis: Secondary | ICD-10-CM | POA: Diagnosis not present

## 2023-04-26 ENCOUNTER — Ambulatory Visit (HOSPITAL_COMMUNITY)
Admission: RE | Admit: 2023-04-26 | Discharge: 2023-04-26 | Disposition: A | Discharge status: Home / Self Care | Payer: Medicare Other | Source: Ambulatory Visit | Attending: Family Medicine | Admitting: Family Medicine

## 2023-04-26 DIAGNOSIS — R42 Dizziness and giddiness: Secondary | ICD-10-CM | POA: Insufficient documentation

## 2023-05-04 ENCOUNTER — Other Ambulatory Visit (HOSPITAL_COMMUNITY): Payer: Self-pay | Admitting: Family Medicine

## 2023-05-04 DIAGNOSIS — R110 Nausea: Secondary | ICD-10-CM | POA: Diagnosis not present

## 2023-05-04 DIAGNOSIS — Z79899 Other long term (current) drug therapy: Secondary | ICD-10-CM | POA: Diagnosis not present

## 2023-05-04 DIAGNOSIS — K317 Polyp of stomach and duodenum: Secondary | ICD-10-CM | POA: Diagnosis not present

## 2023-05-04 DIAGNOSIS — E785 Hyperlipidemia, unspecified: Secondary | ICD-10-CM | POA: Diagnosis not present

## 2023-05-04 DIAGNOSIS — R42 Dizziness and giddiness: Secondary | ICD-10-CM | POA: Diagnosis not present

## 2023-05-04 DIAGNOSIS — K219 Gastro-esophageal reflux disease without esophagitis: Secondary | ICD-10-CM | POA: Diagnosis not present

## 2023-05-12 ENCOUNTER — Telehealth: Payer: Self-pay | Admitting: Physician Assistant

## 2023-05-12 ENCOUNTER — Other Ambulatory Visit: Payer: Self-pay

## 2023-05-12 MED ORDER — ALPRAZOLAM 0.5 MG PO TABS: ORAL_TABLET | ORAL | 1 refills | Status: DC

## 2023-05-12 NOTE — Telephone Encounter (Signed)
 Pt called and made an appt for 4/23 with teresa. She needs a refill on her xanax. Pharmacy is [facility] on [address] in [place]

## 2023-05-12 NOTE — Telephone Encounter (Signed)
 Pended alprazolam to [facility] in [place]

## 2023-05-23 ENCOUNTER — Ambulatory Visit (HOSPITAL_COMMUNITY)
Admission: RE | Admit: 2023-05-23 | Discharge: 2023-05-23 | Disposition: A | Discharge status: Home / Self Care | Payer: Self-pay | Source: Ambulatory Visit | Attending: Family Medicine | Admitting: Family Medicine

## 2023-05-23 DIAGNOSIS — E785 Hyperlipidemia, unspecified: Secondary | ICD-10-CM | POA: Insufficient documentation

## 2023-06-02 ENCOUNTER — Other Ambulatory Visit: Payer: Self-pay | Admitting: Physician Assistant

## 2023-06-23 ENCOUNTER — Other Ambulatory Visit (HOSPITAL_COMMUNITY): Payer: Self-pay

## 2023-06-29 ENCOUNTER — Encounter: Payer: Self-pay | Admitting: Physician Assistant

## 2023-06-29 ENCOUNTER — Ambulatory Visit: Admitting: Physician Assistant

## 2023-06-29 DIAGNOSIS — F319 Bipolar disorder, unspecified: Secondary | ICD-10-CM

## 2023-06-29 DIAGNOSIS — G4700 Insomnia, unspecified: Secondary | ICD-10-CM | POA: Diagnosis not present

## 2023-06-29 DIAGNOSIS — F411 Generalized anxiety disorder: Secondary | ICD-10-CM | POA: Diagnosis not present

## 2023-06-29 MED ORDER — LAMOTRIGINE 200 MG PO TABS: 200.0000 mg | ORAL_TABLET | Freq: Two times a day (BID) | ORAL | 1 refills | Status: DC

## 2023-06-29 MED ORDER — ALPRAZOLAM 0.5 MG PO TABS: ORAL_TABLET | ORAL | 5 refills | Status: DC

## 2023-06-29 MED ORDER — TRAZODONE HCL 100 MG PO TABS: 200.0000 mg | ORAL_TABLET | Freq: Every evening | ORAL | 1 refills | Status: DC | PRN

## 2023-06-29 MED ORDER — BUSPIRONE HCL 15 MG PO TABS: ORAL_TABLET | ORAL | 1 refills | Status: DC

## 2023-06-29 NOTE — Progress Notes (Signed)
 [facility]  Patient ID: [name],  MRN: [id number]  PCP: Wendi Ham, NP  Date of Evaluation: 06/29/2023 Time spent:20 minutes  Chief Complaint:  Chief Complaint   Anxiety; Depression; Follow-up    HISTORY/CURRENT STATUS: HPI  For routine med check.  She's doing really well.  Patient is able to enjoy things. Dating a guy and it's going well.  Energy and motivation are good.  Work is going well.   No extreme sadness, tearfulness, or feelings of hopelessness.  Sleeps well most of the time with Trazodone . ADLs and personal hygiene are normal.   Denies any changes in concentration, making decisions, or remembering things.  Appetite has not changed.  Weight is stable.  Anxiety is well-controlled.  Denies suicidal or homicidal thoughts.  Patient denies increased energy with decreased need for sleep, increased talkativeness, racing thoughts, impulsivity or risky behaviors, increased spending, increased libido, grandiosity, increased irritability or anger, paranoia, or hallucinations.  Denies dizziness, syncope, seizures, numbness, tingling, tremor, tics, unsteady gait, slurred speech, confusion. Denies muscle or joint pain, stiffness, or dystonia.  Individual Medical History/ Review of Systems: Changes? :No      Past medications for mental health diagnoses include: Effexor, Ativan, Paxil, Lithobid, Xanax , Lamictal , trazodone , Saphris, Zoloft, Seroquel, BuSpar  Wellbutrin  caused tremor  Allergies: Meperidine and 5-alpha reductase inhibitors  Current Medications:  Current Outpatient Medications:    acetaminophen  (TYLENOL ) 500 MG tablet, Take 500 mg by mouth every 6 (six) hours as needed for mild pain or moderate pain., Disp: , Rfl:    dicyclomine  (BENTYL ) 20 MG tablet, TAKE 1 TABLET(20 MG) BY MOUTH THREE TIMES DAILY AS NEEDED FOR ABDOMINAL PAIN, Disp: 30 tablet, Rfl: 1   meclizine  (ANTIVERT ) 25 MG tablet, Take 1 tablet (25 mg total) by mouth 2 (two) times daily as  needed for dizziness., Disp: 30 tablet, Rfl: 0   ALPRAZolam  (XANAX ) 0.5 MG tablet, TAKE 1 TABLET(0.5 MG) BY MOUTH THREE TIMES DAILY AS NEEDED FOR ANXIETY, Disp: 90 tablet, Rfl: 5   busPIRone  (BUSPAR ) 15 MG tablet, TAKE 1 TABLET(15 MG) BY MOUTH TWICE DAILY, Disp: 180 tablet, Rfl: 1   lamoTRIgine  (LAMICTAL ) 200 MG tablet, Take 1 tablet (200 mg total) by mouth 2 (two) times daily., Disp: 200 tablet, Rfl: 1   traZODone  (DESYREL ) 100 MG tablet, Take 2 tablets (200 mg total) by mouth at bedtime as needed for sleep., Disp: 180 tablet, Rfl: 1 Medication Side Effects: none  Family Medical/ Social History: Changes? No  MENTAL HEALTH EXAM:  There were no vitals taken for this visit.There is no height or weight on file to calculate BMI.  General Appearance: Casual, Neat and Well Groomed  Eye Contact:  Good  Speech:  Clear and Coherent and Normal Rate  Volume:  Normal  Mood:  Euthymic  Affect:  Congruent  Thought Process:  Goal Directed and Descriptions of Associations: Circumstantial  Orientation:  Full (Time, Place, and Person)  Thought Content: Logical   Suicidal Thoughts:  No  Homicidal Thoughts:  No  Memory:  WNL  Judgement:  Good  Insight:  Good  Psychomotor Activity:  Normal  Concentration:  Concentration: Good and Attention Span: Good  Recall:  Good  Fund of Knowledge: Good  Language: Good  Assets:  Communication Skills Desire for Improvement Financial Resources/Insurance Housing Transportation Vocational/Educational  ADL's:  Intact  Cognition: WNL  Prognosis:  Good   DIAGNOSES:    ICD-10-CM   1. Bipolar I disorder (HCC)  F31.9     2. Generalized anxiety  disorder  F41.1     3. Insomnia, unspecified type  G47.00      Receiving Psychotherapy: No   RECOMMENDATIONS:  PDMP was reviewed.  Last Xanax  filled 06/24/2023. I provided 20 minutes of face to face time during this encounter, including time spent before and after the visit in records review, medical decision making,  counseling pertinent to today's visit, and charting.   She's doing well so no changes are needed.   Continue Xanax  0.5 mg, 1 p.o. 3 times daily as needed anxiety. Continue BuSpar  15 mg, 1 p.o. twice daily. Continue Lamictal  100 mg, 2 p.o. twice daily. Continue trazodone  100 mg, 1-2 nightly as needed sleep. Return in 6 months.   Marvia Slocumb, PA-C

## 2023-07-20 NOTE — Telephone Encounter (Signed)
 I called patient to confirm her current mammogram status. Patient states she is schedule for a mammogram on Monday, May 19. Patient was advised to bring a copy of her mammogram report to her pre op visit on August 15, 2023 for Dr. Leora to review.

## 2023-08-08 DIAGNOSIS — Z1231 Encounter for screening mammogram for malignant neoplasm of breast: Secondary | ICD-10-CM | POA: Diagnosis not present

## 2023-08-09 DIAGNOSIS — R600 Localized edema: Secondary | ICD-10-CM | POA: Diagnosis not present

## 2023-08-09 DIAGNOSIS — R609 Edema, unspecified: Secondary | ICD-10-CM | POA: Diagnosis not present

## 2023-08-09 DIAGNOSIS — K589 Irritable bowel syndrome without diarrhea: Secondary | ICD-10-CM | POA: Diagnosis not present

## 2023-08-09 DIAGNOSIS — K580 Irritable bowel syndrome with diarrhea: Secondary | ICD-10-CM | POA: Diagnosis not present

## 2023-08-20 ENCOUNTER — Other Ambulatory Visit: Payer: Self-pay

## 2023-08-20 ENCOUNTER — Encounter: Payer: Self-pay | Admitting: Emergency Medicine

## 2023-08-20 ENCOUNTER — Ambulatory Visit
Admission: EM | Admit: 2023-08-20 | Discharge: 2023-08-20 | Disposition: A | Discharge status: Home / Self Care | Attending: Family Medicine | Admitting: Family Medicine

## 2023-08-20 DIAGNOSIS — R21 Rash and other nonspecific skin eruption: Secondary | ICD-10-CM | POA: Diagnosis not present

## 2023-08-20 DIAGNOSIS — B353 Tinea pedis: Secondary | ICD-10-CM

## 2023-08-20 DIAGNOSIS — T148XXA Other injury of unspecified body region, initial encounter: Secondary | ICD-10-CM | POA: Diagnosis not present

## 2023-08-20 MED ORDER — TRIAMCINOLONE ACETONIDE 0.1 % EX CREA: 1.0000 | TOPICAL_CREAM | Freq: Two times a day (BID) | CUTANEOUS | 0 refills | Status: DC

## 2023-08-20 MED ORDER — METHYLPREDNISOLONE SODIUM SUCC 125 MG IJ SOLR
80.0000 mg | Freq: Once | INTRAMUSCULAR | Status: AC
  Administered 2023-08-20: 80 mg via INTRAMUSCULAR

## 2023-08-20 NOTE — ED Provider Notes (Signed)
 [facility]    CSN: [id number] Arrival date & time: 08/20/23  1343      History   Chief Complaint Chief Complaint  Patient presents with   Pruritis    HPI [name] is a 68 y.o. female.   Pt reports left foot itching, blister noted to posterior ankle. Denies any known injury or known bite to left foot but reports right foot had something similar and that was related to a insect bite.       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/29/2022   Glossitis 11/11/2022   Palpitations 10/13/2022   Medial epicondylitis of elbow, left 02/10/2022   IBS (irritable bowel syndrome) 08/31/2021   Polyp of duodenum 06/26/2021   Gastroesophageal reflux disease without esophagitis 04/01/2021   Hyperlipidemia 06/12/2020   B12 deficiency 05/14/2020   Vitamin D deficiency 05/14/2020   Sensory seizure (HCC) 01/02/2020   History of sensory changes 01/02/2020   Tremor of both hands 01/02/2020   History of brain concussion 01/02/2020   Depressed bipolar I disorder in partial remission (HCC) 01/01/2018   Anxiety 01/01/2018    Past Surgical History:  Procedure Laterality Date   BIOPSY  05/15/2021   Procedure: BIOPSY;  Surgeon: Urban Garden, MD;  Location: [facility];  Service: Gastroenterology;;   BIOPSY  08/24/2021   Procedure: BIOPSY;  Surgeon: Normie Becton., MD;  Location: [facility];  Service: Gastroenterology;;   BIOPSY  08/12/2022   Procedure: BIOPSY;  Surgeon: Normie Becton., MD;  Location: [facility];  Service: Gastroenterology;;    COLONOSCOPY     COLONOSCOPY N/A 08/08/2019   Procedure: COLONOSCOPY;  Surgeon: Ruby Corporal, MD;  Location: [facility];  Service: Endoscopy;  Laterality: N/A;  220   COLONOSCOPY N/A 08/12/2022   Procedure: COLONOSCOPY;  Surgeon: Brice Campi Albino Alu., MD;  Location: [facility];  Service: Gastroenterology;  Laterality: N/A;   COLONOSCOPY WITH PROPOFOL  N/A 05/15/2021   large ulcer at IC valve with negative biopsies for dysplasia. two tubular adenomas. Recommending surveillance in 6 months to assess ulcer healing. Patient prefers to undergo this at time of EGD for history of duodenal adenoma.   ENDOSCOPIC MUCOSAL RESECTION  08/24/2021   Dr. Brice Campi: duodenal polyp s/p piecemeal removal, path tubulovillous adenoma. Repeat in 9-12 months.   ENDOSCOPIC MUCOSAL RESECTION  08/12/2022   Procedure: ENDOSCOPIC MUCOSAL RESECTION;  Surgeon: Brice Campi Albino Alu., MD;  Location: [facility];  Service: Gastroenterology;;   ENTEROSCOPY N/A 08/12/2022   Procedure: ENTEROSCOPY;  Surgeon: Normie Becton., MD;  Location: [facility];  Service: Gastroenterology;  Laterality: N/A;   ESOPHAGOGASTRODUODENOSCOPY (EGD) WITH PROPOFOL  N/A 05/15/2021   negative H.pylori, duodenal adenoma. Referred for EMR.   ESOPHAGOGASTRODUODENOSCOPY (EGD) WITH PROPOFOL  N/A 08/24/2021   Procedure: ESOPHAGOGASTRODUODENOSCOPY (EGD) WITH PROPOFOL ;  Surgeon: Brice Campi Albino Alu., MD;  Location: [facility];  Service: Gastroenterology;  Laterality: N/A;   HEMOSTASIS CLIP PLACEMENT  08/24/2021   Procedure: HEMOSTASIS CLIP PLACEMENT;  Surgeon: Normie Becton., MD;  Location: [facility];  Service: Gastroenterology;;   HEMOSTASIS CLIP PLACEMENT  08/12/2022   Procedure: HEMOSTASIS CLIP PLACEMENT;  Surgeon: Normie Becton., MD;  Location: [facility];  Service: Gastroenterology;;   HOT HEMOSTASIS N/A 08/24/2021   Procedure: HOT HEMOSTASIS (ARGON PLASMA COAGULATION/BICAP);  Surgeon: Normie Becton., MD;  Location: [facility];  Service: Gastroenterology;  Laterality: N/A;   HOT HEMOSTASIS N/A 08/12/2022   Procedure: HOT HEMOSTASIS (ARGON PLASMA COAGULATION/BICAP);  Surgeon: Normie Becton., MD;  Location: [facility];  Service: Gastroenterology;  Laterality: N/A;   oophrorectomy     POLYPECTOMY  08/08/2019   Procedure: POLYPECTOMY;  Surgeon: Ruby Corporal, MD;  Location: [facility];  Service: Endoscopy;;   POLYPECTOMY  05/15/2021   Procedure: POLYPECTOMY;  Surgeon: Urban Garden, MD;  Location: [facility];  Service: Gastroenterology;;   POLYPECTOMY  08/12/2022   Procedure: POLYPECTOMY;  Surgeon: Normie Becton., MD;  Location: [facility];  Service: Gastroenterology;;   SUBMUCOSAL LIFTING INJECTION  08/24/2021   Procedure: SUBMUCOSAL LIFTING INJECTION;  Surgeon: Normie Becton., MD;  Location: [facility];  Service: Gastroenterology;;   SUBMUCOSAL LIFTING INJECTION  08/12/2022   Procedure: SUBMUCOSAL LIFTING INJECTION;  Surgeon: Normie Becton., MD;  Location: [facility];  Service: Gastroenterology;;   SUBMUCOSAL TATTOO INJECTION  08/24/2021   Procedure: SUBMUCOSAL TATTOO INJECTION;  Surgeon: Normie Becton., MD;  Location: [facility];  Service: Gastroenterology;;   TUBAL LIGATION      OB History     Gravida      Para      Term      Preterm      AB      Living  2      SAB      IAB      Ectopic      Multiple      Live Births               Home Medications    Prior to Admission medications   Medication Sig Start Date End Date Taking? Authorizing Provider  triamcinolone cream (KENALOG) 0.1 % Apply 1 Application topically 2 (two) times daily. 08/20/23  Yes Corbin Dess, PA-C  acetaminophen  (TYLENOL ) 500 MG tablet Take 500 mg by mouth every 6 (six) hours as needed for mild pain or moderate pain.    [provider]  ALPRAZolam  (XANAX ) 0.5 MG tablet TAKE 1 TABLET(0.5 MG)  BY MOUTH THREE TIMES DAILY AS NEEDED FOR ANXIETY 06/29/23   Hurst, Ammon Bales T, PA-C  busPIRone  (BUSPAR ) 15 MG tablet TAKE 1 TABLET(15 MG) BY MOUTH TWICE DAILY 06/29/23   Hurst, Ammon Bales T, PA-C  dicyclomine  (BENTYL ) 20 MG tablet TAKE 1 TABLET(20 MG) BY MOUTH THREE TIMES DAILY AS NEEDED FOR ABDOMINAL PAIN 04/26/22   Umberto Ganong, Bearl Limes, MD  lamoTRIgine  (LAMICTAL ) 200 MG tablet Take 1 tablet (200 mg total) by mouth 2 (two) times daily. 06/29/23   Verneda Golder, PA-C  meclizine  (ANTIVERT ) 25 MG tablet Take 1 tablet (25 mg total) by mouth 2 (two) times daily as needed for dizziness. 03/10/23   Grooms, Courtney, PA-C  traZODone  (DESYREL ) 100 MG tablet Take 2 tablets (200 mg total) by mouth at bedtime as needed for sleep. 06/29/23   Verneda Golder, PA-C    Family History Family History  Problem Relation Age of Onset   Cancer Mother        lichen planus - oral cancer   Cancer Father        lung   Cancer Maternal Uncle  uterine   Diabetes Maternal Uncle    Stroke Maternal Grandmother    COPD Paternal Grandfather    Colon cancer Neg Hx    Esophageal cancer Neg Hx    Stomach cancer Neg Hx    Liver cancer Neg Hx    Pancreatic cancer Neg Hx     Social History Social History   Tobacco Use   Smoking status: Former    Current packs/day: 0.00    Types: Cigarettes    Quit date: 08/19/2021    Years since quitting: 2.0   Smokeless tobacco: Never  Vaping Use   Vaping status: Never Used  Substance Use Topics   Alcohol use: Yes    Alcohol/week: 2.0 standard drinks of alcohol    Types: 2 Glasses of wine per week    Comment: socially   Drug use: No     Allergies   Meperidine and 5-alpha reductase inhibitors   Review of Systems Review of Systems   Physical Exam Triage Vital Signs ED Triage Vitals  Encounter Vitals Group     BP 08/20/23 1422 132/78     Girls Systolic BP Percentile --      Girls Diastolic BP Percentile --      Boys Systolic BP Percentile --      Boys Diastolic  BP Percentile --      Pulse Rate 08/20/23 1422 81     Resp 08/20/23 1422 20     Temp 08/20/23 1422 98.3 F (36.8 C)     Temp Source 08/20/23 1422 Oral     SpO2 08/20/23 1422 93 %     Weight --      Height --      Head Circumference --      Peak Flow --      Pain Score 08/20/23 1425 2     Pain Loc --      Pain Education --      Exclude from Growth Chart --    No data found.  Updated Vital Signs BP 132/78 (BP Location: Right Arm)   Pulse 81   Temp 98.3 F (36.8 C) (Oral)   Resp 20   SpO2 93%   Visual Acuity Right Eye Distance:   Left Eye Distance:   Bilateral Distance:    Right Eye Near:   Left Eye Near:    Bilateral Near:     Physical Exam   UC Treatments / Results  Labs (all labs ordered are listed, but only abnormal results are displayed) Labs Reviewed - No data to display  EKG   Radiology No results found.  Procedures Procedures (including critical care time)  Medications Ordered in UC Medications  methylPREDNISolone sodium succinate (SOLU-MEDROL) 125 mg/2 mL injection 80 mg (80 mg Intramuscular Given 08/20/23 1455)    Initial Impression / Assessment and Plan / UC Course  I have reviewed the triage vital signs and the nursing notes.  Pertinent labs & imaging results that were available during my care of the patient were reviewed by me and considered in my medical decision making (see chart for details).     *** Final Clinical Impressions(s) / UC Diagnoses   Final diagnoses:  Blister  Rash  Tinea pedis of left foot     Discharge Instructions      I am giving you a steroid shot today to help with itching and what I suspect to be an allergic site reaction to something like an insect bite or sting.  I have  also given you a steroid cream to help with the same that you may apply topically as needed.  Regarding your suspected athlete's foot, you may continue using clotrimazole or other over-the-counter antifungal medications and keeping your feet  clean and dry.  As we discussed, do not pop the blister on your foot you may keep the area covered and if and when it pops on its own clean the area with soap and water , apply Vaseline or Aquaphor and keep it covered until it is healed.   ED Prescriptions     Medication Sig Dispense Auth. Provider   triamcinolone cream (KENALOG) 0.1 % Apply 1 Application topically 2 (two) times daily. 80 g Corbin Dess, [place]      PDMP not reviewed this encounter.

## 2023-08-20 NOTE — ED Triage Notes (Signed)
 Pt reports left foot itching, blister noted to posterior ankle. Denies any known injury or known bite to left foot but reports right foot had something similar and that was related to a insect bite.

## 2023-08-20 NOTE — Discharge Instructions (Addendum)
 I am giving you a steroid shot today to help with itching and what I suspect to be an allergic site reaction to something like an insect bite or sting.  I have also given you a steroid cream to help with the same that you may apply topically as needed.  Regarding your suspected athlete's foot, you may continue using clotrimazole or other over-the-counter antifungal medications and keeping your feet clean and dry.  As we discussed, do not pop the blister on your foot you may keep the area covered and if and when it pops on its own clean the area with soap and water , apply Vaseline or Aquaphor and keep it covered until it is healed.

## 2023-09-06 DIAGNOSIS — D1801 Hemangioma of skin and subcutaneous tissue: Secondary | ICD-10-CM | POA: Diagnosis not present

## 2023-09-06 DIAGNOSIS — Z85828 Personal history of other malignant neoplasm of skin: Secondary | ICD-10-CM | POA: Diagnosis not present

## 2023-09-06 DIAGNOSIS — L814 Other melanin hyperpigmentation: Secondary | ICD-10-CM | POA: Diagnosis not present

## 2023-09-06 DIAGNOSIS — L570 Actinic keratosis: Secondary | ICD-10-CM | POA: Diagnosis not present

## 2023-09-06 DIAGNOSIS — L821 Other seborrheic keratosis: Secondary | ICD-10-CM | POA: Diagnosis not present

## 2023-09-06 DIAGNOSIS — D225 Melanocytic nevi of trunk: Secondary | ICD-10-CM | POA: Diagnosis not present

## 2023-09-06 DIAGNOSIS — D485 Neoplasm of uncertain behavior of skin: Secondary | ICD-10-CM | POA: Diagnosis not present

## 2023-10-17 DIAGNOSIS — G8929 Other chronic pain: Secondary | ICD-10-CM | POA: Diagnosis not present

## 2023-10-17 DIAGNOSIS — M542 Cervicalgia: Secondary | ICD-10-CM | POA: Diagnosis not present

## 2023-10-17 DIAGNOSIS — M549 Dorsalgia, unspecified: Secondary | ICD-10-CM | POA: Diagnosis not present

## 2023-10-17 DIAGNOSIS — N62 Hypertrophy of breast: Secondary | ICD-10-CM | POA: Diagnosis not present

## 2023-10-17 DIAGNOSIS — L304 Erythema intertrigo: Secondary | ICD-10-CM | POA: Diagnosis not present

## 2023-10-18 DIAGNOSIS — Z780 Asymptomatic menopausal state: Secondary | ICD-10-CM | POA: Diagnosis not present

## 2023-10-18 DIAGNOSIS — Z13820 Encounter for screening for osteoporosis: Secondary | ICD-10-CM | POA: Diagnosis not present

## 2023-10-20 DIAGNOSIS — D485 Neoplasm of uncertain behavior of skin: Secondary | ICD-10-CM | POA: Diagnosis not present

## 2023-10-20 DIAGNOSIS — L570 Actinic keratosis: Secondary | ICD-10-CM | POA: Diagnosis not present

## 2023-10-20 DIAGNOSIS — C44722 Squamous cell carcinoma of skin of right lower limb, including hip: Secondary | ICD-10-CM | POA: Diagnosis not present

## 2023-10-21 ENCOUNTER — Encounter (HOSPITAL_COMMUNITY): Payer: Self-pay | Admitting: Emergency Medicine

## 2023-10-21 ENCOUNTER — Emergency Department (HOSPITAL_COMMUNITY)

## 2023-10-21 ENCOUNTER — Emergency Department (HOSPITAL_COMMUNITY)
Admission: EM | Admit: 2023-10-21 | Discharge: 2023-10-21 | Disposition: A | Discharge status: Home / Self Care | Attending: Emergency Medicine | Admitting: Emergency Medicine

## 2023-10-21 ENCOUNTER — Other Ambulatory Visit: Payer: Self-pay

## 2023-10-21 DIAGNOSIS — N3001 Acute cystitis with hematuria: Secondary | ICD-10-CM

## 2023-10-21 DIAGNOSIS — R1084 Generalized abdominal pain: Secondary | ICD-10-CM | POA: Insufficient documentation

## 2023-10-21 DIAGNOSIS — K449 Diaphragmatic hernia without obstruction or gangrene: Secondary | ICD-10-CM | POA: Diagnosis not present

## 2023-10-21 DIAGNOSIS — R140 Abdominal distension (gaseous): Secondary | ICD-10-CM | POA: Diagnosis not present

## 2023-10-21 DIAGNOSIS — R1032 Left lower quadrant pain: Secondary | ICD-10-CM | POA: Diagnosis not present

## 2023-10-21 DIAGNOSIS — R109 Unspecified abdominal pain: Secondary | ICD-10-CM | POA: Diagnosis not present

## 2023-10-21 DIAGNOSIS — K5730 Diverticulosis of large intestine without perforation or abscess without bleeding: Secondary | ICD-10-CM | POA: Diagnosis not present

## 2023-10-21 DIAGNOSIS — R918 Other nonspecific abnormal finding of lung field: Secondary | ICD-10-CM | POA: Diagnosis not present

## 2023-10-21 LAB — COMPREHENSIVE METABOLIC PANEL WITH GFR
ALT: 11 U/L (ref 0–44)
AST: 16 U/L (ref 15–41)
Albumin: 4 g/dL (ref 3.5–5.0)
Alkaline Phosphatase: 84 U/L (ref 38–126)
Anion gap: 11 (ref 5–15)
BUN: 13 mg/dL (ref 8–23)
CO2: 24 mmol/L (ref 22–32)
Calcium: 9.5 mg/dL (ref 8.9–10.3)
Chloride: 105 mmol/L (ref 98–111)
Creatinine, Ser: 0.73 mg/dL (ref 0.44–1.00)
GFR, Estimated: >60 mL/min (ref >60)
Glucose, Bld: 96 mg/dL (ref 70–99)
Potassium: 4 mmol/L (ref 3.5–5.1)
Sodium: 140 mmol/L (ref 135–145)
Total Bilirubin: 0.7 mg/dL (ref 0.0–1.2)
Total Protein: 6.7 g/dL (ref 6.5–8.1)

## 2023-10-21 LAB — CBC WITH DIFFERENTIAL/PLATELET
Abs Immature Granulocytes: 0.02 K/uL (ref 0.00–0.07)
Basophils Absolute: 0 K/uL (ref 0.0–0.1)
Basophils Relative: 0 %
Eosinophils Absolute: 0 K/uL (ref 0.0–0.5)
Eosinophils Relative: 0 %
HCT: 45 % (ref 36.0–46.0)
Hemoglobin: 15.2 g/dL — ABNORMAL HIGH (ref 12.0–15.0)
Immature Granulocytes: 0 %
Lymphocytes Relative: 22 %
Lymphs Abs: 1.6 K/uL (ref 0.7–4.0)
MCH: 35.3 pg — ABNORMAL HIGH (ref 26.0–34.0)
MCHC: 33.8 g/dL (ref 30.0–36.0)
MCV: 104.4 fL — ABNORMAL HIGH (ref 80.0–100.0)
Monocytes Absolute: 0.7 K/uL (ref 0.1–1.0)
Monocytes Relative: 9 %
Neutro Abs: 5 K/uL (ref 1.7–7.7)
Neutrophils Relative %: 69 %
Platelets: 241 K/uL (ref 150–400)
RBC: 4.31 MIL/uL (ref 3.87–5.11)
RDW: 12.9 % (ref 11.5–15.5)
WBC: 7.4 K/uL (ref 4.0–10.5)
nRBC: 0 % (ref 0.0–0.2)

## 2023-10-21 LAB — URINALYSIS, ROUTINE W REFLEX MICROSCOPIC
Bacteria, UA: NONE SEEN
Bilirubin Urine: NEGATIVE
Glucose, UA: NEGATIVE mg/dL
Ketones, ur: NEGATIVE mg/dL
Nitrite: NEGATIVE
Protein, ur: NEGATIVE mg/dL
Specific Gravity, Urine: 1.039 — ABNORMAL HIGH (ref 1.005–1.030)
pH: 6 (ref 5.0–8.0)

## 2023-10-21 LAB — LIPASE, BLOOD: Lipase: 38 U/L (ref 11–51)

## 2023-10-21 LAB — TROPONIN I (HIGH SENSITIVITY): Troponin I (High Sensitivity): <2 ng/L (ref <18)

## 2023-10-21 MED ORDER — SODIUM CHLORIDE 0.9 % IV BOLUS
1000.0000 mL | Freq: Once | INTRAVENOUS | Status: AC
  Administered 2023-10-21: 1000 mL via INTRAVENOUS

## 2023-10-21 MED ORDER — DICYCLOMINE HCL 10 MG/ML IM SOLN
20.0000 mg | Freq: Once | INTRAMUSCULAR | Status: DC
  Filled 2023-10-21: qty 2

## 2023-10-21 MED ORDER — CEPHALEXIN 500 MG PO CAPS
500.0000 mg | ORAL_CAPSULE | Freq: Once | ORAL | Status: AC
  Administered 2023-10-21: 500 mg via ORAL
  Filled 2023-10-21: qty 1

## 2023-10-21 MED ORDER — DICYCLOMINE HCL 10 MG PO CAPS
10.0000 mg | ORAL_CAPSULE | Freq: Once | ORAL | Status: AC
  Administered 2023-10-21: 10 mg via ORAL
  Filled 2023-10-21: qty 1

## 2023-10-21 MED ORDER — ONDANSETRON HCL 4 MG/2ML IJ SOLN
4.0000 mg | Freq: Once | INTRAMUSCULAR | Status: AC
  Administered 2023-10-21: 4 mg via INTRAVENOUS
  Filled 2023-10-21: qty 2

## 2023-10-21 MED ORDER — FENTANYL CITRATE (PF) 100 MCG/2ML IJ SOLN
25.0000 ug | Freq: Once | INTRAMUSCULAR | Status: AC
  Administered 2023-10-21: 25 ug via INTRAVENOUS
  Filled 2023-10-21: qty 2

## 2023-10-21 MED ORDER — CEPHALEXIN 500 MG PO CAPS: 500.0000 mg | ORAL_CAPSULE | Freq: Three times a day (TID) | ORAL | 0 refills | Status: AC

## 2023-10-21 MED ORDER — IOHEXOL 300 MG/ML  SOLN
100.0000 mL | Freq: Once | INTRAMUSCULAR | Status: AC | PRN
  Administered 2023-10-21: 100 mL via INTRAVENOUS

## 2023-10-21 NOTE — ED Provider Notes (Signed)
  EMERGENCY DEPARTMENT AT [facility] Provider Note   CSN: [id number] Arrival date & time: 10/21/23  1439     Patient presents with: Abdominal Pain   [name] is a 68 y.o. female.   Patient is a 68 year old female who presents emergency department the chief complaint of generalized abdominal pain, nausea, vomiting which began just prior to arrival.  Patient notes that she was concerned that she may be experiencing a bout of diverticulitis.  She denies any associated diarrhea or constipation.  She has had no dysuria or hematuria.  She denies any associated fever, chills, chest pain, shortness of breath.  She has had no associated dizziness, lightheadedness or syncope.  She denies any recent falls or blunt abdominal trauma.   Abdominal Pain      Prior to Admission medications   Medication Sig Start Date End Date Taking? Authorizing Provider  acetaminophen (TYLENOL) 500 MG tablet Take 500 mg by mouth every 6 (six) hours as needed for mild pain or moderate pain.    [provider]  ALPRAZolam (XANAX) 0.5 MG tablet TAKE 1 TABLET(0.5 MG) BY MOUTH THREE TIMES DAILY AS NEEDED FOR ANXIETY 06/29/23   Hurst, Verneita T, PA-C  busPIRone (BUSPAR) 15 MG tablet TAKE 1 TABLET(15 MG) BY MOUTH TWICE DAILY 06/29/23   Hurst, Verneita T, PA-C  dicyclomine (BENTYL) 20 MG tablet TAKE 1 TABLET(20 MG) BY MOUTH THREE TIMES DAILY AS NEEDED FOR ABDOMINAL PAIN 04/26/22   Eartha Flavors, Toribio, MD  fluorouracil (EFUDEX) 5 % cream Apply 1 Application topically daily. 09/06/23   [provider]  furosemide (LASIX) 20 MG tablet Take 20 mg by mouth daily as needed for fluid or edema. 08/09/23   [provider]  lamoTRIgine (LAMICTAL) 200 MG tablet Take 1 tablet (200 mg total) by mouth 2 (two) times daily. 06/29/23   Rhys Verneita DASEN, PA-C  meclizine (ANTIVERT) 25 MG tablet Take 1 tablet (25 mg total) by mouth 2 (two) times daily as needed for dizziness. 03/10/23   Grooms,  Courtney, PA-C  traZODone (DESYREL) 100 MG tablet Take 2 tablets (200 mg total) by mouth at bedtime as needed for sleep. 06/29/23   Rhys Verneita T, PA-C  triamcinolone cream (KENALOG) 0.1 % Apply 1 Application topically 2 (two) times daily. 08/20/23   Stuart Vernell Norris, PA-C    Allergies: 5-alpha reductase inhibitors and Meperidine    Review of Systems  Gastrointestinal:  Positive for abdominal pain.  All other systems reviewed and are negative.   Updated Vital Signs BP 125/77   Pulse 84   Temp 97.6 F (36.4 C) (Oral)   Resp 16   Ht 5' 7 (1.702 m)   Wt 75.5 kg   SpO2 91%   BMI 26.06 kg/m   Physical Exam Vitals and nursing note reviewed.  Constitutional:      General: She is not in acute distress.    Appearance: Normal appearance. She is not ill-appearing.  HENT:     Head: Normocephalic and atraumatic.     Nose: Nose normal.     Mouth/Throat:     Mouth: Mucous membranes are moist.  Eyes:     Extraocular Movements: Extraocular movements intact.     Conjunctiva/sclera: Conjunctivae normal.     Pupils: Pupils are equal, round, and reactive to light.  Cardiovascular:     Rate and Rhythm: Normal rate and regular rhythm.     Pulses: Normal pulses.     Heart sounds: Normal heart sounds. No  murmur heard.    No gallop.  Pulmonary:     Effort: Pulmonary effort is normal. No respiratory distress.     Breath sounds: Normal breath sounds. No stridor. No wheezing, rhonchi or rales.  Abdominal:     General: Abdomen is flat. Bowel sounds are normal. There is no distension. There are no signs of injury.     Palpations: Abdomen is soft.     Tenderness: There is generalized abdominal tenderness.     Hernia: No hernia is present.  Musculoskeletal:        General: Normal range of motion.     Cervical back: Normal range of motion and neck supple.  Skin:    General: Skin is warm and dry.     Coloration: Skin is not mottled.     Findings: No rash.  Neurological:     General: No  focal deficit present.     Mental Status: She is alert and oriented to person, place, and time. Mental status is at baseline.  Psychiatric:        Mood and Affect: Mood normal.        Behavior: Behavior normal.        Thought Content: Thought content normal.        Judgment: Judgment normal.     (all labs ordered are listed, but only abnormal results are displayed) Labs Reviewed  CBC WITH DIFFERENTIAL/PLATELET - Abnormal; Notable for the following components:      Result Value   Hemoglobin 15.2 (*)    MCV 104.4 (*)    MCH 35.3 (*)    All other components within normal limits  URINALYSIS, ROUTINE W REFLEX MICROSCOPIC - Abnormal; Notable for the following components:   Specific Gravity, Urine 1.039 (*)    Hgb urine dipstick SMALL (*)    Leukocytes,Ua LARGE (*)    All other components within normal limits  COMPREHENSIVE METABOLIC PANEL WITH GFR  LIPASE, BLOOD  TROPONIN I (HIGH SENSITIVITY)    EKG: None  Radiology: CT ABDOMEN PELVIS W CONTRAST Result Date: 10/21/2023 CLINICAL DATA:  LEFT lower quadrant pain EXAM: CT ABDOMEN AND PELVIS WITH CONTRAST TECHNIQUE: Multidetector CT imaging of the abdomen and pelvis was performed using the standard protocol following bolus administration of intravenous contrast. RADIATION DOSE REDUCTION: This exam was performed according to the departmental dose-optimization program which includes automated exposure control, adjustment of the mA and/or kV according to patient size and/or use of iterative reconstruction technique. CONTRAST:  OMNIPAQUE  IOHEXOL  300 MG/ML  SOLN COMPARISON:  None Available. FINDINGS: Lower chest: Lung bases are clear. Hepatobiliary: No focal hepatic lesion. Normal gallbladder. No biliary duct dilatation. Common bile duct is normal. Pancreas: Pancreas is normal. No ductal dilatation. No pancreatic inflammation. Spleen: Normal spleen Adrenals/urinary tract: Adrenal glands and kidneys are normal. The ureters and bladder normal.  Stomach/Bowel: Small hiatal hernia. Stomach, small bowel, appendix, and cecum are normal. Multiple diverticula of the descending colon and sigmoid colon without acute inflammation. Vascular/Lymphatic: Abdominal aorta is normal caliber. No periportal or retroperitoneal adenopathy. No pelvic adenopathy. Reproductive: Uterus and adnexa unremarkable. Other: No free fluid. Musculoskeletal: No aggressive osseous lesion. IMPRESSION: 1. No acute findings in the abdomen pelvis. 2. Small hiatal hernia. 3. Mild LEFT diverticulosis.  No evidence acute diverticulitis. Electronically Signed   By: Jackquline Boxer M.D.   On: 10/21/2023 19:10     Procedures   Medications Ordered in the ED  ondansetron  (ZOFRAN ) injection 4 mg (4 mg Intravenous Given 10/21/23 1548)  sodium  chloride 0.9 % bolus 1,000 mL (1,000 mLs Intravenous New Bag/Given 10/21/23 1548)  dicyclomine  (BENTYL ) capsule 10 mg (10 mg Oral Given 10/21/23 1616)  fentaNYL  (SUBLIMAZE ) injection 25 mcg (25 mcg Intravenous Given 10/21/23 1846)  iohexol  (OMNIPAQUE ) 300 MG/ML solution 100 mL (100 mLs Intravenous Contrast Given 10/21/23 1805)                                    Medical Decision Making Patient is doing much better at this time and is stable for discharge home.  She notes the pain has completely resolved in the emergency department and she has returned back to her baseline.  Patient has adamantly denied any associated chest pain or shortness of breath.  Do not suspect underlying cardiac etiology of her pain at this point.  Pain was completely reproducible with palpation over the abdomen.  Urinalysis is concerning for urinary tract infection.  There was noted by the nursing staff that the patient may have desaturated into the 80s while in the emergency department but do not suspect that this was a good pleth.  Once the O2 monitor was transferred to her earlobe she was experiencing O2 saturations at approximately 97 to 98% while I was conversing with her.   Did discuss the possibility with the patient about admission given the possible desaturations though she has declined at this point.  Chest x-ray was unremarkable.  CT scan of abdomen and pelvis demonstrated no signs of acute surgical process or indication for diverticulitis.  Did recommend close follow-up with pulmonology given her history of smoking.  Lung sounds are clear to auscultation bilaterally.  Do not suspect pneumonia at this time.  Will place patient on antibiotics at this point.  Strict return precautions were discussed for any new or worsening symptoms.  Patient voiced understanding to the plan and had no additional questions.  Amount and/or Complexity of Data Reviewed Labs: ordered. Radiology: ordered.  Risk Prescription drug management.        Final diagnoses:  None    ED Discharge Orders     None          Daralene Lonni JONETTA DEVONNA 10/21/23 2220    Patsey Lot, MD 10/22/23 [phone]

## 2023-10-21 NOTE — Discharge Instructions (Signed)
 Please take all antibiotics as directed.  Follow-up closely with your primary care doctor and with pulmonology on an outpatient basis.  Return to emergency department immediately for any new or worsening symptoms.

## 2023-10-21 NOTE — ED Triage Notes (Signed)
 Pt to the ED with abdominal pain and bloating that began this morning.  Pt states she had a normal BM this morning.  Pt has a history of diverticulitis.

## 2023-10-21 NOTE — ED Notes (Signed)
 Ambulated patient on room air. Saturations dropped 85% but patient denies any shortness of breath

## 2023-10-23 LAB — URINE CULTURE: Culture: NO GROWTH

## 2023-10-24 ENCOUNTER — Telehealth (INDEPENDENT_AMBULATORY_CARE_PROVIDER_SITE_OTHER): Payer: Self-pay | Admitting: Gastroenterology

## 2023-10-24 NOTE — Telephone Encounter (Signed)
 This is a [facility] patient of Anna's. She LMOM that she needed to make an OV. [phone]

## 2023-10-27 DIAGNOSIS — R9389 Abnormal findings on diagnostic imaging of other specified body structures: Secondary | ICD-10-CM | POA: Diagnosis not present

## 2023-10-27 DIAGNOSIS — R42 Dizziness and giddiness: Secondary | ICD-10-CM | POA: Diagnosis not present

## 2023-10-27 DIAGNOSIS — N3001 Acute cystitis with hematuria: Secondary | ICD-10-CM | POA: Diagnosis not present

## 2023-10-27 DIAGNOSIS — E785 Hyperlipidemia, unspecified: Secondary | ICD-10-CM | POA: Diagnosis not present

## 2023-11-06 DIAGNOSIS — J441 Chronic obstructive pulmonary disease with (acute) exacerbation: Secondary | ICD-10-CM | POA: Diagnosis not present

## 2023-11-16 ENCOUNTER — Other Ambulatory Visit: Payer: Self-pay | Admitting: Physician Assistant

## 2023-11-17 DIAGNOSIS — Z7951 Long term (current) use of inhaled steroids: Secondary | ICD-10-CM | POA: Diagnosis not present

## 2023-11-17 DIAGNOSIS — K317 Polyp of stomach and duodenum: Secondary | ICD-10-CM | POA: Diagnosis not present

## 2023-11-17 DIAGNOSIS — Z79899 Other long term (current) drug therapy: Secondary | ICD-10-CM | POA: Diagnosis not present

## 2023-11-17 DIAGNOSIS — R42 Dizziness and giddiness: Secondary | ICD-10-CM | POA: Diagnosis not present

## 2023-11-17 DIAGNOSIS — R9389 Abnormal findings on diagnostic imaging of other specified body structures: Secondary | ICD-10-CM | POA: Diagnosis not present

## 2023-11-17 DIAGNOSIS — R110 Nausea: Secondary | ICD-10-CM | POA: Diagnosis not present

## 2023-11-17 DIAGNOSIS — K219 Gastro-esophageal reflux disease without esophagitis: Secondary | ICD-10-CM | POA: Diagnosis not present

## 2023-11-17 DIAGNOSIS — E785 Hyperlipidemia, unspecified: Secondary | ICD-10-CM | POA: Diagnosis not present

## 2023-11-17 DIAGNOSIS — R062 Wheezing: Secondary | ICD-10-CM | POA: Diagnosis not present

## 2023-12-11 ENCOUNTER — Other Ambulatory Visit: Payer: Self-pay | Admitting: Physician Assistant

## 2023-12-20 ENCOUNTER — Telehealth: Payer: Self-pay | Admitting: Physician Assistant

## 2023-12-20 MED ORDER — BUSPIRONE HCL 15 MG PO TABS: ORAL_TABLET | ORAL | 0 refills | Status: DC

## 2023-12-20 NOTE — Telephone Encounter (Signed)
 Appt 10/23   Needs rf of Buspirone . When it goes to [facility] it needs a PA and she is completely out so she is asking for a 30 day supply be sent to [facility] on [address] in [place]

## 2023-12-20 NOTE — Telephone Encounter (Signed)
 30-day supply sent to [facility] per request.

## 2023-12-21 ENCOUNTER — Encounter (INDEPENDENT_AMBULATORY_CARE_PROVIDER_SITE_OTHER): Payer: Self-pay | Admitting: Gastroenterology

## 2023-12-29 ENCOUNTER — Ambulatory Visit (INDEPENDENT_AMBULATORY_CARE_PROVIDER_SITE_OTHER): Admitting: Physician Assistant

## 2023-12-29 ENCOUNTER — Encounter: Payer: Self-pay | Admitting: Physician Assistant

## 2023-12-29 DIAGNOSIS — F319 Bipolar disorder, unspecified: Secondary | ICD-10-CM

## 2023-12-29 DIAGNOSIS — F411 Generalized anxiety disorder: Secondary | ICD-10-CM

## 2023-12-29 DIAGNOSIS — G4700 Insomnia, unspecified: Secondary | ICD-10-CM | POA: Diagnosis not present

## 2023-12-29 MED ORDER — LAMOTRIGINE 200 MG PO TABS: 200.0000 mg | ORAL_TABLET | Freq: Two times a day (BID) | ORAL | 3 refills | Status: AC

## 2023-12-29 MED ORDER — TRAZODONE HCL 100 MG PO TABS: 100.0000 mg | ORAL_TABLET | Freq: Every evening | ORAL | 3 refills | Status: AC | PRN

## 2023-12-29 MED ORDER — ALPRAZOLAM 0.5 MG PO TABS: ORAL_TABLET | ORAL | 5 refills | Status: AC

## 2023-12-29 MED ORDER — BUSPIRONE HCL 15 MG PO TABS: ORAL_TABLET | ORAL | 3 refills | Status: AC
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
## 2023-12-30 ENCOUNTER — Encounter: Payer: Self-pay | Admitting: Physician Assistant

## 2024-01-01 NOTE — Progress Notes (Signed)
 "  New Patient Pulmonology Office Visit   Subjective:  Patient ID: [name], female    DOB: [birth year]  MRN: [id number]  Referred by: Hyacinth Honey, NP  CC:  Chief Complaint  Patient presents with   Establish Care    Abn xray = scarring  Coughing with milky colored mucus     HPI [name] is a 68 y.o. female with anxiety, BPD, HLD, and nicotine dependence who presents for initial consultation in the setting of abnormal chest imaging.  Discussed the use of AI scribe software for clinical note transcription with the patient, who gave verbal consent to proceed. History of Present Illness [name] is a 68 year old female who presents with concerns about lung health and potential sleep apnea.  She recently visited the hospital due to concerns of a diverticulitis attack, which was later identified as a urinary tract infection (UTI). During this visit, she reported that her oxygen levels dropped into the low 80s during exertion. She has since monitored her oxygen levels at home, which have remained above 90%, except for a few instances at night where it dropped to 89.  She has a history of smoking, having started at age 35 and quitting a year ago, resulting in approximately 30 pack years. She experiences occasional wheezing at night and a productive cough, particularly when lying flat, with sputum described as 'milky' and sometimes green. No hemoptysis. She has noted some weight gain over the past six months but no significant weight loss.  She experiences significant snoring at night, as reported by her boyfriend, but denies any witnessed apneas or choking episodes. She recalls one episode of waking up gasping and sweating. No daytime fatigue and she maintains an active lifestyle, working 30 hours a week and engaging in regular physical activity.  Her family history is notable for her father dying of stage four lung cancer and her mother of esophageal cancer, both  of whom were non-smokers. [place] and was exposed to chemicals that are no longer permitted today. Her father expressed concern about potential environmental exposure risks to her health.  She currently takes Xanax  as needed, Buspar , a topical ointment for blisters, Lasix, lamotrigine , and trazodone . She has adjusted her trazodone  dosage recently due to concerns about its impact on her sleep quality and snoring.  No current allergies, high blood pressure, or significant breathing difficulties during the day. She reports seasonal allergies that have worsened with age, particularly in the fall and spring, causing rhinorrhea, itchy eyes, and coughing.   ROS  Allergies: 5-alpha reductase inhibitors and Meperidine  Current Outpatient Medications:    acetaminophen  (TYLENOL ) 500 MG tablet, Take 500 mg by mouth every 6 (six) hours as needed for mild pain or moderate pain., Disp: , Rfl:    ALPRAZolam  (XANAX ) 0.5 MG tablet, TAKE 1 TABLET(0.5 MG) BY MOUTH THREE TIMES DAILY AS NEEDED FOR ANXIETY, Disp: 90 tablet, Rfl: 5   busPIRone  (BUSPAR ) 15 MG tablet, TAKE 1 TABLET(15 MG) BY MOUTH TWICE DAILY, Disp: 180 tablet, Rfl: 3   dicyclomine  (BENTYL ) 20 MG tablet, TAKE 1 TABLET(20 MG) BY MOUTH THREE TIMES DAILY AS NEEDED FOR ABDOMINAL PAIN, Disp: 30 tablet, Rfl: 1   fluorouracil (EFUDEX) 5 % cream, Apply 1 Application topically daily., Disp: , Rfl:    furosemide (LASIX) 20 MG tablet, Take 20 mg by mouth daily as needed for fluid or edema., Disp: , Rfl:    lamoTRIgine  (LAMICTAL ) 200 MG tablet,  Take 1 tablet (200 mg total) by mouth 2 (two) times daily., Disp: 200 tablet, Rfl: 3   meclizine  (ANTIVERT ) 25 MG tablet, Take 1 tablet (25 mg total) by mouth 2 (two) times daily as needed for dizziness., Disp: 30 tablet, Rfl: 0   traZODone  (DESYREL ) 100 MG tablet, Take 1-2 tablets (100-200 mg total) by mouth at bedtime as needed for sleep., Disp: 180 tablet, Rfl: 3   triamcinolone  cream (KENALOG ) 0.1  %, Apply 1 Application topically 2 (two) times daily., Disp: 80 g, Rfl: 0 Past Medical History:  Diagnosis Date   Anxiety    Anxiety state 01/01/2018   Bipolar 1 disorder (HCC)    Bipolar disorder (HCC) 12/12/2013   Depression    Diverticulitis    Diverticulitis of colon 03/15/2016   Hyperlipidemia 06/12/2020   Insomnia 01/01/2018   Tobacco abuse 06/25/2016   Tubular adenoma 10/02/2018   Past Surgical History:  Procedure Laterality Date   BIOPSY  05/15/2021   Procedure: BIOPSY;  Surgeon: Eartha Angelia Sieving, MD;  [facility];  Service: Gastroenterology;;   BIOPSY  08/24/2021   Procedure: BIOPSY;  Surgeon: Wilhelmenia Aloha Raddle., MD;  Location: [facility];  Service: Gastroenterology;;   BIOPSY  08/12/2022   Procedure: BIOPSY;  Surgeon: Wilhelmenia Aloha Raddle., MD;  Location: [facility];  Service: Gastroenterology;;   COLONOSCOPY     COLONOSCOPY N/A 08/08/2019   Procedure: COLONOSCOPY;  Surgeon: Golda Claudis PENNER, MD;  [facility];  Service: Endoscopy;  Laterality: N/A;  220   COLONOSCOPY N/A 08/12/2022   Procedure: COLONOSCOPY;  Surgeon: Wilhelmenia Aloha Raddle., MD;  Location: [facility];  Service: Gastroenterology;  Laterality: N/A;   COLONOSCOPY WITH PROPOFOL  N/A 05/15/2021   large ulcer at IC valve with negative biopsies for dysplasia. two tubular adenomas. Recommending surveillance in 6 months to assess ulcer healing. Patient prefers to undergo this at time of EGD for history of duodenal adenoma.   ENDOSCOPIC MUCOSAL RESECTION  08/24/2021   Dr. Wilhelmenia: duodenal polyp s/p piecemeal removal, path tubulovillous adenoma. Repeat in 9-12 months.   ENDOSCOPIC MUCOSAL RESECTION  08/12/2022   Procedure: ENDOSCOPIC MUCOSAL RESECTION;  Surgeon: Wilhelmenia Aloha Raddle., MD;  Location: [facility];  Service: Gastroenterology;;   ENTEROSCOPY N/A 08/12/2022   Procedure: ENTEROSCOPY;  Surgeon: Wilhelmenia Aloha Raddle., MD;  [facility];  Service:  Gastroenterology;  Laterality: N/A;   ESOPHAGOGASTRODUODENOSCOPY (EGD) WITH PROPOFOL  N/A 05/15/2021   negative H.pylori, duodenal adenoma. Referred for EMR.   ESOPHAGOGASTRODUODENOSCOPY (EGD) WITH PROPOFOL  N/A 08/24/2021   Procedure: ESOPHAGOGASTRODUODENOSCOPY (EGD) WITH PROPOFOL ;  Surgeon: Wilhelmenia Aloha Raddle., MD;  [facility];  Service: Gastroenterology;  Laterality: N/A;   HEMOSTASIS CLIP PLACEMENT  08/24/2021   Procedure: HEMOSTASIS CLIP PLACEMENT;  Surgeon: Wilhelmenia Aloha Raddle., MD;  Location: [facility];  Service: Gastroenterology;;   HEMOSTASIS CLIP PLACEMENT  08/12/2022   Procedure: HEMOSTASIS CLIP PLACEMENT;  Surgeon: Wilhelmenia Aloha Raddle., MD;  [facility];  Service: Gastroenterology;;   HOT HEMOSTASIS N/A 08/24/2021   Procedure: HOT HEMOSTASIS (ARGON PLASMA COAGULATION/BICAP);  Surgeon: Wilhelmenia Aloha Raddle., MD;  Location: [facility];  Service: Gastroenterology;  Laterality: N/A;   HOT HEMOSTASIS N/A 08/12/2022   Procedure: HOT HEMOSTASIS (ARGON PLASMA COAGULATION/BICAP);  Surgeon: Wilhelmenia Aloha Raddle., MD;  Location: [facility];  Service: Gastroenterology;  Laterality: N/A;   oophrorectomy     POLYPECTOMY  08/08/2019   Procedure: POLYPECTOMY;  Surgeon: Golda Claudis PENNER, MD;  [facility];  Service: Endoscopy;;   POLYPECTOMY  05/15/2021  Procedure: POLYPECTOMY;  Surgeon: Eartha Flavors, Toribio, MD;  [facility];  Service: Gastroenterology;;   POLYPECTOMY  08/12/2022   Procedure: POLYPECTOMY;  Surgeon: Wilhelmenia Aloha Raddle., MD;  Location: [facility];  Service: Gastroenterology;;   ROBLEY MEYER INJECTION  08/24/2021   Procedure: SUBMUCOSAL LIFTING INJECTION;  Surgeon: Wilhelmenia Aloha Raddle., MD;  Location: [facility];  Service: Gastroenterology;;   SUBMUCOSAL LIFTING INJECTION  08/12/2022   Procedure: SUBMUCOSAL LIFTING INJECTION;  Surgeon: Wilhelmenia Aloha Raddle., MD;  Location: [facility];   Service: Gastroenterology;;   SUBMUCOSAL TATTOO INJECTION  08/24/2021   Procedure: SUBMUCOSAL TATTOO INJECTION;  Surgeon: Wilhelmenia Aloha Raddle., MD;  Location[facility];  Service: Gastroenterology;;   TUBAL LIGATION     Family History  Problem Relation Age of Onset   Cancer Mother        lichen planus - oral cancer   Cancer Father        lung   Cancer Maternal Uncle        uterine   Diabetes Maternal Uncle    Stroke Maternal Grandmother    COPD Paternal Grandfather    Colon cancer Neg Hx    Esophageal cancer Neg Hx    Stomach cancer Neg Hx    Liver cancer Neg Hx    Pancreatic cancer Neg Hx    Social History   Socioeconomic History   Marital status: Divorced    Spouse name: Not on file   Number of children: 2   Years of education: 13   Highest education level: Not on file  Occupational History   Occupation: manufacturing engineer    Comment: [employer], [place]  Tobacco Use   Smoking status: Former    Current packs/day: 0.00    Types: Cigarettes    Quit date: 08/19/2021    Years since quitting: 2.3   Smokeless tobacco: Never  Vaping Use   Vaping status: Never Used  Substance and Sexual Activity   Alcohol use: Yes    Alcohol/week: 2.0 standard drinks of alcohol    Types: 2 Glasses of wine per week    Comment: socially   Drug use: No   Sexual activity: Not Currently    Birth control/protection: Post-menopausal  Other Topics Concern   Not on file  Social History Narrative   Lives alone   She has 2 sons that are close by.      She enjoys going out to dinner and spend time with family.      Diet reports eating all foods.   Caffeine none   Water  6 to 8 cups regularly daily.      Social Drivers of Corporate Investment Banker Strain: Not on file  Food Insecurity: No Food Insecurity (10/18/2023)   [facility]   Hunger Vital Sign    Within the past 12 months, you worried that your food would run out before you got the money to buy  more.: Never true    Within the past 12 months, the food you bought just didn't last and you didn't have money to get more.: Never true  Transportation Needs: No Transportation Needs (10/18/2023)   [facility]   PRAPARE - Transportation    Lack of Transportation (Medical): No    Lack of Transportation (Non-Medical): No  Physical Activity: Not on file  Stress: Not on file  Social Connections: Not on file  Intimate Partner Violence: Not At Risk (02/09/2022)   Received from [facility]  Care   Humiliation, Afraid, Rape, and Kick questionnaire    Within the last year, have you been afraid of your partner or ex-partner?: No    Within the last year, have you been humiliated or emotionally abused in other ways by your partner or ex-partner?: No    Within the last year, have you been kicked, hit, slapped, or otherwise physically hurt by your partner or ex-partner?: No    Within the last year, have you been raped or forced to have any kind of sexual activity by your partner or ex-partner?: No       Objective:  BP 132/78   Pulse 91   Ht 5' 7 (1.702 m)   Wt 173 lb 6.4 oz (78.7 kg)   SpO2 95% Comment: ra  BMI 27.16 kg/m  Wt Readings from Last 3 Encounters:  01/02/24 173 lb 6.4 oz (78.7 kg)  10/21/23 166 lb 6.4 oz (75.5 kg)  03/10/23 168 lb 3.2 oz (76.3 kg)   BMI Readings from Last 3 Encounters:  01/02/24 27.16 kg/m  10/21/23 26.06 kg/m  03/10/23 26.34 kg/m   SpO2 Readings from Last 3 Encounters:  01/02/24 95%  10/21/23 94%  08/20/23 93%   Physical Exam General: NAD, alert, WD, WN Eyes: PERRL, no scleral icterus ENMT: oropharynx clear, good dentition, no oral lesions, mallampati score I  Skin: warm, intact, no rashes Neck: JVD flat, ROM and lymph node assessment normal CV: RRR, no MRG, nl S1 and S2, no peripheral edema Resp: clear to auscultation bilaterally, no wheezes, rales, or rhonchi, normal effort, no clubbing/cyanosis Abdom: Normoactive bowel sounds,  soft, nontender, nondistended, no hepatosplenomegaly Ext: edema Neuro: Awake alert oriented to person place time and situation  Diagnostic Review:  Last CBC Lab Results  Component Value Date   WBC 7.4 10/21/2023   HGB 15.2 (H) 10/21/2023   HCT 45.0 10/21/2023   MCV 104.4 (H) 10/21/2023   MCH 35.3 (H) 10/21/2023   RDW 12.9 10/21/2023   PLT 241 10/21/2023   Last metabolic panel Lab Results  Component Value Date   GLUCOSE 96 10/21/2023   NA 140 10/21/2023   K 4.0 10/21/2023   CL 105 10/21/2023   CO2 24 10/21/2023   BUN 13 10/21/2023   CREATININE 0.73 10/21/2023   GFRNONAA >60 10/21/2023   CALCIUM  9.5 10/21/2023   PROT 6.7 10/21/2023   ALBUMIN 4.0 10/21/2023   LABGLOB 2.2 03/10/2023   AGRATIO 2.4 (H) 05/25/2022   BILITOT 0.7 10/21/2023   ALKPHOS 84 10/21/2023   AST 16 10/21/2023   ALT 11 10/21/2023   ANIONGAP 11 10/21/2023    CXR 10/21/23: hyperinflation  CT coronaries 05/2023: no parenchymal lung disease on my personal review FINDINGS: Heart is normal size. Punctate calcification in the aortic root. No evidence of aortic aneurysm. No adenopathy. Linear scarring in the lingula and right middle lobe. No confluent opacities or effusions. Calcified granuloma in the right lower lobe. No acute findings in the upper abdomen. Chest wall soft tissues are unremarkable. No acute bony abnormality.    Assessment & Plan:   Assessment & Plan Cigarette nicotine dependence in remission Quit 1 year ago. Sleep-disordered breathing  Encounter for screening for lung cancer The patient is a former cigarette smoker and has a pack year history of 30 years and stop smoking 1 years ago. They are currently asymptomatic and between the ages of 10 and 34 years old and has quit smoking less than 15 years ago. They do meet criteria for a low  dose CT scan can for lung cancer screening. They were counseled regarding the necessity for lung cancer screening and is agreeable to have this imaging  performed.   Assessment & Plan Suspected chronic obstructive pulmonary disease (COPD) or emphysema with chronic cough, nocturnal wheezing, and sputum production Chronic cough, nocturnal wheezing, and sputum production suggest COPD or emphysema. Smoking history of 30 pack-years, quit one year ago. Chest x-ray shows hyperinflation and flat diaphragms, indicative of COPD. Differential includes interstitial lung disease, but less likely due to lung hyperinflation and lack of any fibrotic changes on CT ABD/PLV 10/21/2023. Potential for emphysema due to smoking history (30 PY) and lung hyperinflation. - Order CT scan for lung cancer screening to assess for emphysema and lung scarring. - Order pulmonary function test to evaluate for COPD. - Advise smoking cessation to prevent further lung function decline.  Suspected sleep apnea Loud snoring, nocturnal wheezing, and one episode of waking up gasping with cold sweats suggest sleep apnea. Occasional nocturnal oxygen desaturation to 89%. STOP BANG 2. ESS 5. Mallampati Score I.  Prefers in-lab sleep study for accurate diagnosis. - Order in-lab sleep study to evaluate for sleep apnea.  Lung cancer screening due to history of tobacco use and family history 30 pack-years of smoking and family history of lung cancer. No masses on recent x-ray, but screening indicated due to smoking history and age. CT scan can detect small nodules not visible on x-ray. - Refer to lung cancer screening program for CT scan. - Order CT scan to assess for lung nodules and early signs of lung cancer.  Follow-Up Undergo CT scan, pulmonary function test, and in-lab sleep study. Results will guide further management.  Orders Placed This Encounter  Procedures   Ambulatory Referral for Lung Cancer Scre   Pulmonary function test   Split night study   I spent 45 minutes reviewing patient's chart including prior consultant notes, imaging, and PFTs as well as face-to-face with the  patient, over half in discussion of the diagnosis and the importance of compliance with the treatment plan.  Return in about 4 weeks (around 01/30/2024).   Kwasi Joung, MD "

## 2024-01-02 ENCOUNTER — Ambulatory Visit: Admitting: Pulmonary Disease

## 2024-01-02 ENCOUNTER — Encounter: Payer: Self-pay | Admitting: Pulmonary Disease

## 2024-01-02 VITALS — BP 132/78 | HR 91 | Ht 67.0 in | Wt 173.4 lb

## 2024-01-02 DIAGNOSIS — Z122 Encounter for screening for malignant neoplasm of respiratory organs: Secondary | ICD-10-CM

## 2024-01-02 DIAGNOSIS — G4730 Sleep apnea, unspecified: Secondary | ICD-10-CM

## 2024-01-02 DIAGNOSIS — F17211 Nicotine dependence, cigarettes, in remission: Secondary | ICD-10-CM | POA: Diagnosis not present

## 2024-01-02 NOTE — Patient Instructions (Signed)
  VISIT SUMMARY: Today, we discussed your concerns about lung health and potential sleep apnea. We reviewed your recent hospital visit, smoking history, and family history of cancer. We have planned several tests to better understand your lung health and to check for sleep apnea.  YOUR PLAN: SUSPECTED CHRONIC OBSTRUCTIVE PULMONARY DISEASE (COPD) OR EMPHYSEMA: You have symptoms like chronic cough, wheezing at night, and sputum production, which suggest COPD or emphysema. Your smoking history and chest x-ray findings support this. -We will order a CT scan to check for emphysema and lung scarring. -We will also order a pulmonary function test to evaluate for COPD. -It's important to continue avoiding smoking to prevent further lung damage.  SUSPECTED SLEEP APNEA: Your loud snoring, nocturnal wheezing, and an episode of waking up gasping suggest sleep apnea. -We will arrange an in-lab sleep study to evaluate for sleep apnea.  LUNG CANCER SCREENING: Given your smoking history and family history of lung cancer, it's important to screen for lung cancer. -We will refer you to a lung cancer screening program for a CT scan. -The CT scan will help us  look for any lung nodules or early signs of lung cancer.  SEASONAL ALLERGIC RHINITIS: You have increased symptoms of runny nose, itchy eyes, and cough during fall and spring, which suggest seasonal allergies. -We will manage your symptoms as needed, especially during allergy seasons.  GOALS OF CARE: You value being proactive about your health and want thorough testing due to your family history and past smoking. -We will proceed with the planned tests to accurately diagnose and manage your conditions.  FOLLOW-UP: We need to follow up after your tests to guide further management. -Please undergo the CT scan, pulmonary function test, and in-lab sleep study as planned. -We will review the results and decide on the next steps based on the  findings.                      Contains text generated by [employer].                                 Contains text generated by [employer].

## 2024-01-10 ENCOUNTER — Encounter (HOSPITAL_BASED_OUTPATIENT_CLINIC_OR_DEPARTMENT_OTHER)

## 2024-01-19 ENCOUNTER — Encounter (HOSPITAL_BASED_OUTPATIENT_CLINIC_OR_DEPARTMENT_OTHER)

## 2024-02-10 ENCOUNTER — Telehealth: Payer: Self-pay | Admitting: Acute Care

## 2024-02-10 DIAGNOSIS — Z87891 Personal history of nicotine dependence: Secondary | ICD-10-CM

## 2024-02-10 DIAGNOSIS — Z122 Encounter for screening for malignant neoplasm of respiratory organs: Secondary | ICD-10-CM

## 2024-02-10 NOTE — Telephone Encounter (Signed)
 Lung Cancer Screening Narrative/Criteria Questionnaire (Cigarette Smokers Only- No Cigars/Pipes/vapes)   [name]   SDMV:02/28/24@3pm [place]                                           Apr 20, 1955               LDCT: 03/06/24@ 230p [name]    68 y.o.   Phone: [phone]  Lung Screening Narrative (confirm age 44-77 yrs Medicare / 50-80 yrs Private pay insurance)   Insurance information:[employer]   Referring Provider:Alghanim   This screening involves an initial phone call with a team member from our program. It is called a shared decision making visit. The initial meeting is required by insurance and Medicare to make sure you understand the program. This appointment takes about 15-20 minutes to complete. The CT scan will completed at a separate date/time. This scan takes about 5-10 minutes to complete and you may eat and drink before and after the scan.  Criteria questions for Lung Cancer Screening:   Are you a current or former smoker? Former Age began smoking: 38y   If you are a former smoker, what year did you quit smoking? 2025 (about 8 weeks or so) To calculate your smoking history, I need an accurate estimate of how many packs of cigarettes you smoked per day and for how many years. (Not just the number of PPD you are now smoking)   Years smoking 32 x Packs per day 1 = Pack years 32   (at least 20 pack yrs)   (Make sure they understand that we need to know how much they have smoked in the past, not just the number of PPD they are smoking now)  Do you have a personal history of cancer?  No    Do you have a family history of cancer? Yes  (cancer type and and relative) father/lung and mother/throat (both non smokers)  Are you coughing up blood?  No  Have you had unexplained weight loss of 15 lbs or more in the last 6 months? No  It looks like you meet all criteria.     Additional information: N/A

## 2024-02-15 ENCOUNTER — Ambulatory Visit (INDEPENDENT_AMBULATORY_CARE_PROVIDER_SITE_OTHER)

## 2024-02-15 ENCOUNTER — Other Ambulatory Visit (HOSPITAL_BASED_OUTPATIENT_CLINIC_OR_DEPARTMENT_OTHER): Payer: Self-pay

## 2024-02-15 DIAGNOSIS — F17211 Nicotine dependence, cigarettes, in remission: Secondary | ICD-10-CM | POA: Diagnosis not present

## 2024-02-15 LAB — PULMONARY FUNCTION TEST
DL/VA % pred: 103 %
DL/VA: 4.19 ml/min/mmHg/L
DLCO unc % pred: 82 %
DLCO unc: 17.91 ml/min/mmHg
FEF 25-75 Post: 2.59 L/s
FEF 25-75 Pre: 1.68 L/s
FEF2575-%Change-Post: 53 %
FEF2575-%Pred-Post: 116 %
FEF2575-%Pred-Pre: 75 %
FEV1-%Change-Post: 12 %
FEV1-%Pred-Post: 69 %
FEV1-%Pred-Pre: 62 %
FEV1-Post: 1.85 L
FEV1-Pre: 1.65 L
FEV1FVC-%Change-Post: -4 %
FEV1FVC-%Pred-Pre: 107 %
FEV6-%Change-Post: 18 %
FEV6-%Pred-Post: 70 %
FEV6-%Pred-Pre: 59 %
FEV6-Post: 2.36 L
FEV6-Pre: 2 L
FEV6FVC-%Pred-Post: 104 %
FEV6FVC-%Pred-Pre: 104 %
FVC-%Change-Post: 17 %
FVC-%Pred-Post: 67 %
FVC-%Pred-Pre: 57 %
FVC-Post: 2.36 L
FVC-Pre: 2.01 L
Post FEV1/FVC ratio: 79 %
Post FEV6/FVC ratio: 100 %
Pre FEV1/FVC ratio: 82 %
Pre FEV6/FVC Ratio: 100 %
RV % pred: 133 %
RV: 3.04 L
TLC % pred: 106 %
TLC: 5.84 L

## 2024-02-15 NOTE — Patient Instructions (Signed)
 Full PFT performed today per Dr. Catherine.

## 2024-02-15 NOTE — Progress Notes (Signed)
 Full PFT performed today per Dr. Catherine.

## 2024-02-16 ENCOUNTER — Encounter: Payer: Self-pay | Admitting: *Deleted

## 2024-02-16 ENCOUNTER — Ambulatory Visit: Admitting: Gastroenterology

## 2024-02-16 ENCOUNTER — Telehealth: Payer: Self-pay

## 2024-02-16 VITALS — BP 126/81 | HR 70 | Temp 98.4°F | Ht 67.0 in | Wt 177.8 lb

## 2024-02-16 DIAGNOSIS — R6881 Early satiety: Secondary | ICD-10-CM

## 2024-02-16 DIAGNOSIS — K219 Gastro-esophageal reflux disease without esophagitis: Secondary | ICD-10-CM | POA: Diagnosis not present

## 2024-02-16 DIAGNOSIS — R140 Abdominal distension (gaseous): Secondary | ICD-10-CM

## 2024-02-16 DIAGNOSIS — R109 Unspecified abdominal pain: Secondary | ICD-10-CM

## 2024-02-16 DIAGNOSIS — Z86018 Personal history of other benign neoplasm: Secondary | ICD-10-CM

## 2024-02-16 MED ORDER — CIPROFLOXACIN HCL 500 MG PO TABS: 500.0000 mg | ORAL_TABLET | Freq: Two times a day (BID) | ORAL | 0 refills | Status: AC

## 2024-02-16 MED ORDER — ONDANSETRON HCL 4 MG PO TABS: 4.0000 mg | ORAL_TABLET | Freq: Three times a day (TID) | ORAL | 1 refills | Status: AC | PRN

## 2024-02-16 MED ORDER — METRONIDAZOLE 500 MG PO TABS: 500.0000 mg | ORAL_TABLET | Freq: Three times a day (TID) | ORAL | 0 refills | Status: AC

## 2024-02-16 MED ORDER — PANTOPRAZOLE SODIUM 40 MG PO TBEC: 40.0000 mg | DELAYED_RELEASE_TABLET | Freq: Every day | ORAL | 3 refills | Status: AC

## 2024-02-16 NOTE — Patient Instructions (Signed)
 I have sent in pantoprazole  to take once each morning, 30 minutes before breakfast. This is for gastritis, esophagitis, reflux, etc.  I sent in Cipro  and Flagyl  to take for a total of 7 days. If you are not better by Monday, please call, and we will need to do a scan.   We will see you in 4-6 weeks; however, if you are not improved by Monday, we will be doing additional testing.  We will be arranging the surveillance enteroscopy in the future!  Have a wonderful holiday season!  I enjoyed seeing you again today! I value our relationship and want to provide genuine, compassionate, and quality care. You may receive a survey regarding your visit with me, and I welcome your feedback! Thanks so much for taking the time to complete this. I look forward to seeing you again.      Therisa MICAEL Stager, PhD, ANP-BC [facility]

## 2024-02-16 NOTE — Telephone Encounter (Signed)
 error

## 2024-02-16 NOTE — Progress Notes (Signed)
 "   Gastroenterology Office Note     Primary Care Physician:  Hyacinth Honey, NP  Primary Gastroenterologist: Dr. Cindie   Chief Complaint   Chief Complaint  Patient presents with   Follow-up    Pt complains of abd swelling (up high) off ad on last week. This week it has been continuously swelling. She is nt constipated. States it feels different than before     History of Present Illness   [name] is a 68 y.o. female presenting today with a history of bipolar disorder, anxiety, depression, insomnia, hyperlipidemia, IBS, duodenal tubulovillous adenoma, chronic abdominal, negative celiac serologies in the past    Needs EGD with enteroscopy for surveillance  now due to duodenal adenoma  Last week started with abdominal discomfort. Ate small amount of corn last week and more chocolate. Woke up Monday and was bloated. Notes abdominal bloating and swelling. If eating anything that is a bigger meal, will bloat. Hx of diverticulitis and states she had bloating with this. No fever or chills. No N/V.  Reeses causing bloating.  Feels very similar and exactly like prior episode.   Gallbladder still present. No weight loss. No right-sided abdominal pain.   US  in 2021.   Aug 2025 CT for abdominal pain, no acute findings, mild left diverticulosis, no diverticulitis.   Colonoscopy 2024 Perianal skin tags found on perianal exam.                           - Hemorrhoids found on digital rectal exam.                           - The examined portion of the terminal ileum was                            normal.                           - The examined portion of the ileocecal valve was                            normal.                           - One 3 mm polyp in the mid rectum, removed with a                            cold snare. Resected and retrieved.                           - Diverticulosis in the entire examined colon.                           - Normal mucosa in the entire  examined colon                            otherwise.                           - Non-bleeding non-thrombosed external and internal  hemorrhoids. Hyperplastic polyp    Small bowel enteroscopy 2024  No gross lesions in the proximal esophagus and in                            the mid esophagus.                           - LA Grade C erosive esophagitis with no bleeding                            found distally.                           - Non-obstructing Schatzki ring.                           - Z-line irregular, 38 cm from the incisors.                           - 2 cm hiatal hernia.                           - Erythematous mucosa in the stomach. Biopsied.                           - Duodenal scar in D2/D3 from previous TVA                            resection. Small evidence of possible recurrence.                            Mucosal resection performed. Ablation performed                            with APC. Clips placed.                           - Normal mucosa was found in the rest of the                            examined duodenum.                           - Normal mucosa was found in the visualized                            proximal jejunum. Pathology with duodenal adenoma and low grade dysplasia, negative high grade dysplasia. No H.pylori.   EMR of duodenal polyp June 2023 via piecemeal fashion. Path positive for tubulovillous adenoma. Repeat EGD for surveillance in 9-12 months.     March 2023: large ulcer at IC valve with negative biopsies for dysplasia. two tubular adenomas. Recommending surveillance in 6 months to assess ulcer healing. Patient prefers to undergo this at time of EGD for history of duodenal adenoma.     Past Medical History:  Diagnosis Date   Anxiety    Anxiety state 01/01/2018  Bipolar 1 disorder (HCC)    Bipolar disorder (HCC) 12/12/2013   Depression    Diverticulitis    Diverticulitis of colon 03/15/2016   Hyperlipidemia  06/12/2020   Insomnia 01/01/2018   Tobacco abuse 06/25/2016   Tubular adenoma 10/02/2018    Past Surgical History:  Procedure Laterality Date   BIOPSY  05/15/2021   Procedure: BIOPSY;  Surgeon: Eartha Angelia Sieving, MD;  [facility];  Service: Gastroenterology;;   BIOPSY  08/24/2021   Procedure: BIOPSY;  Surgeon: Wilhelmenia Aloha Raddle., MD;  Location: [facility];  Service: Gastroenterology;;   BIOPSY  08/12/2022   Procedure: BIOPSY;  Surgeon: Wilhelmenia Aloha Raddle., MD;  Location: [facility];  Service: Gastroenterology;;   COLONOSCOPY     COLONOSCOPY N/A 08/08/2019   Procedure: COLONOSCOPY;  Surgeon: Golda Claudis PENNER, MD;  [facility];  Service: Endoscopy;  Laterality: N/A;  220   COLONOSCOPY N/A 08/12/2022   Procedure: COLONOSCOPY;  Surgeon: Wilhelmenia Aloha Raddle., MD;  Location: [facility];  Service: Gastroenterology;  Laterality: N/A;   COLONOSCOPY WITH PROPOFOL  N/A 05/15/2021   large ulcer at IC valve with negative biopsies for dysplasia. two tubular adenomas. Recommending surveillance in 6 months to assess ulcer healing. Patient prefers to undergo this at time of EGD for history of duodenal adenoma.   ENDOSCOPIC MUCOSAL RESECTION  08/24/2021   Dr. Wilhelmenia: duodenal polyp s/p piecemeal removal, path tubulovillous adenoma. Repeat in 9-12 months.   ENDOSCOPIC MUCOSAL RESECTION  08/12/2022   Procedure: ENDOSCOPIC MUCOSAL RESECTION;  Surgeon: Wilhelmenia Aloha Raddle., MD;  Location: [facility];  Service: Gastroenterology;;   ENTEROSCOPY N/A 08/12/2022   Procedure: ENTEROSCOPY;  Surgeon: Wilhelmenia Aloha Raddle., MD;  Location: [facility];  Service: Gastroenterology;  Laterality: N/A;   ESOPHAGOGASTRODUODENOSCOPY (EGD) WITH PROPOFOL  N/A 05/15/2021   negative H.pylori, duodenal adenoma. Referred for EMR.   ESOPHAGOGASTRODUODENOSCOPY (EGD) WITH PROPOFOL  N/A 08/24/2021   Procedure: ESOPHAGOGASTRODUODENOSCOPY (EGD) WITH PROPOFOL ;  Surgeon: Wilhelmenia  Aloha Raddle., MD;  [facility];  Service: Gastroenterology;  Laterality: N/A;   HEMOSTASIS CLIP PLACEMENT  08/24/2021   Procedure: HEMOSTASIS CLIP PLACEMENT;  Surgeon: Wilhelmenia Aloha Raddle., MD;  Location: [facility];  Service: Gastroenterology;;   HEMOSTASIS CLIP PLACEMENT  08/12/2022   Procedure: HEMOSTASIS CLIP PLACEMENT;  Surgeon: Wilhelmenia Aloha Raddle., MD;  [facility];  Service: Gastroenterology;;   HOT HEMOSTASIS N/A 08/24/2021   Procedure: HOT HEMOSTASIS (ARGON PLASMA COAGULATION/BICAP);  Surgeon: Wilhelmenia Aloha Raddle., MD;  Location: [facility];  Service: Gastroenterology;  Laterality: N/A;   HOT HEMOSTASIS N/A 08/12/2022   Procedure: HOT HEMOSTASIS (ARGON PLASMA COAGULATION/BICAP);  Surgeon: Wilhelmenia Aloha Raddle., MD;  Location: [facility];  Service: Gastroenterology;  Laterality: N/A;   oophrorectomy     POLYPECTOMY  08/08/2019   Procedure: POLYPECTOMY;  Surgeon: Golda Claudis PENNER, MD;  [facility];  Service: Endoscopy;;   POLYPECTOMY  05/15/2021   Procedure: POLYPECTOMY;  Surgeon: Eartha Angelia Sieving, MD;  [facility];  Service: Gastroenterology;;   POLYPECTOMY  08/12/2022   Procedure: POLYPECTOMY;  Surgeon: Wilhelmenia Aloha Raddle., MD;  Location: [facility];  Service: Gastroenterology;;   SUBMUCOSAL LIFTING INJECTION  08/24/2021   Procedure: SUBMUCOSAL LIFTING INJECTION;  Surgeon: Wilhelmenia Aloha Raddle., MD;  Location: [facility];  Service: Gastroenterology;;   ROBLEY LIFTING INJECTION  08/12/2022   Procedure: SUBMUCOSAL LIFTING INJECTION;  Surgeon: Wilhelmenia Aloha Raddle., MD;  Location: [facility];  Service: Gastroenterology;;   SUBMUCOSAL TATTOO INJECTION  08/24/2021   Procedure: SUBMUCOSAL TATTOO INJECTION;  Surgeon: Wilhelmenia,  Aloha Raddle., MD;  Location: [facility];  Service: Gastroenterology;;   TUBAL LIGATION      Current Outpatient Medications  Medication Sig Dispense Refill   acetaminophen   (TYLENOL ) 500 MG tablet Take 500 mg by mouth every 6 (six) hours as needed for mild pain or moderate pain.     ALPRAZolam  (XANAX ) 0.5 MG tablet TAKE 1 TABLET(0.5 MG) BY MOUTH THREE TIMES DAILY AS NEEDED FOR ANXIETY 90 tablet 5   busPIRone  (BUSPAR ) 15 MG tablet TAKE 1 TABLET(15 MG) BY MOUTH TWICE DAILY 180 tablet 3   dicyclomine  (BENTYL ) 20 MG tablet TAKE 1 TABLET(20 MG) BY MOUTH THREE TIMES DAILY AS NEEDED FOR ABDOMINAL PAIN 30 tablet 1   furosemide (LASIX) 20 MG tablet Take 20 mg by mouth daily as needed for fluid or edema.     lamoTRIgine  (LAMICTAL ) 200 MG tablet Take 1 tablet (200 mg total) by mouth 2 (two) times daily. 200 tablet 3   meclizine  (ANTIVERT ) 25 MG tablet Take 1 tablet (25 mg total) by mouth 2 (two) times daily as needed for dizziness. 30 tablet 0   traZODone  (DESYREL ) 100 MG tablet Take 1-2 tablets (100-200 mg total) by mouth at bedtime as needed for sleep. 180 tablet 3   fluorouracil (EFUDEX) 5 % cream Apply 1 Application topically daily. (Patient not taking: Reported on 02/16/2024)     triamcinolone  cream (KENALOG ) 0.1 % Apply 1 Application topically 2 (two) times daily. (Patient not taking: Reported on 02/16/2024) 80 g 0   No current facility-administered medications for this visit.    Allergies as of 02/16/2024 - Review Complete 02/16/2024  Allergen Reaction Noted   5-alpha reductase inhibitors Other (See Comments) 08/08/2019   Meperidine Hives 10/13/2017    Family History  Problem Relation Age of Onset   Cancer Mother        lichen planus - oral cancer   Cancer Father        lung   Cancer Maternal Uncle        uterine   Diabetes Maternal Uncle    Stroke Maternal Grandmother    COPD Paternal Grandfather    Colon cancer Neg Hx    Esophageal cancer Neg Hx    Stomach cancer Neg Hx    Liver cancer Neg Hx    Pancreatic cancer Neg Hx     Social History   Socioeconomic History   Marital status: Divorced    Spouse name: Not on file   Number of children: 2    Years of education: 13   Highest education level: Not on file  Occupational History   Occupation: manufacturing engineer    Comment: [employer], [place]  Tobacco Use   Smoking status: Former    Current packs/day: 0.00    Types: Cigarettes    Quit date: 08/19/2021    Years since quitting: 2.4   Smokeless tobacco: Never  Vaping Use   Vaping status: Never Used  Substance and Sexual Activity   Alcohol use: Yes    Alcohol/week: 2.0 standard drinks of alcohol    Types: 2 Glasses of wine per week    Comment: socially   Drug use: No   Sexual activity: Not Currently    Birth control/protection: Post-menopausal  Other Topics Concern   Not on file  Social History Narrative   Lives alone   She has 2 sons that are close by.      She enjoys going out to dinner and spend time with family.  Diet reports eating all foods.   Caffeine none   Water  6 to 8 cups regularly daily.      Social Drivers of Health   Tobacco Use: Medium Risk (01/02/2024)   Patient History    Smoking Tobacco Use: Former    Smokeless Tobacco Use: Never    Passive Exposure: Not on Actuary Strain: Not on file  Food Insecurity: No Food Insecurity (10/18/2023)   Received from [facility]   Epic    Within the past 12 months, you worried that your food would run out before you got the money to buy more.: Never true    Within the past 12 months, the food you bought just didn't last and you didn't have money to get more.: Never true  Transportation Needs: No Transportation Needs (10/18/2023)   Received from [facility]   PRAPARE - Transportation    Lack of Transportation (Medical): No    Lack of Transportation (Non-Medical): No  Physical Activity: Not on file  Stress: Not on file  Social Connections: Not on file  Intimate Partner Violence: Not At Risk (02/09/2022)   Received from [facility]   Epic    Within the last year, have you been afraid of your partner or ex-partner?: No     Within the last year, have you been humiliated or emotionally abused in other ways by your partner or ex-partner?: No    Within the last year, have you been kicked, hit, slapped, or otherwise physically hurt by your partner or ex-partner?: No    Within the last year, have you been raped or forced to have any kind of sexual activity by your partner or ex-partner?: No  Depression (PHQ2-9): Low Risk (03/10/2023)   Depression (PHQ2-9)    PHQ-2 Score: 0  Alcohol Screen: Not on file  Housing: Not on file  Utilities: Low Risk (10/18/2023)   Received from [facility]   Utilities    Within the past 12 months, have you been unable to get utilities(heat, electricity) when it was really needed?: No  Health Literacy: Low Risk (02/09/2022)   Received from [facility]    How often do you need to have someone help you when you read instructions, pamphlets, or other written material from your doctor or pharmacy?: Never     Review of Systems   Gen: Denies any fever, chills, fatigue, weight loss, lack of appetite.  CV: Denies chest pain, heart palpitations, peripheral edema, syncope.  Resp: Denies shortness of breath at rest or with exertion. Denies wheezing or cough.  GI: Denies dysphagia or odynophagia. Denies jaundice, hematemesis, fecal incontinence. GU : Denies urinary burning, urinary frequency, urinary hesitancy MS: Denies joint pain, muscle weakness, cramps, or limitation of movement.  Derm: Denies rash, itching, dry skin Psych: Denies depression, anxiety, memory loss, and confusion Heme: Denies bruising, bleeding, and enlarged lymph nodes.   Physical Exam   BP 126/81   Pulse 70   Temp 98.4 F (36.9 C)   Ht 5' 7 (1.702 m)   Wt 177 lb 12.8 oz (80.6 kg)   BMI 27.85 kg/m  General:   Alert and oriented. Pleasant and cooperative. Well-nourished and well-developed.  Head:  Normocephalic and atraumatic. Eyes:  Without icterus Abdomen:  +BS, soft, non-tender and  non-distended. No HSM noted. No guarding or rebound. No masses appreciated.  Rectal:  Deferred  Msk:  Symmetrical without gross deformities. Normal posture. Extremities:  Without  edema. Neurologic:  Alert and  oriented x4;  grossly normal neurologically. Skin:  Intact without significant lesions or rashes. Psych:  Alert and cooperative. Normal mood and affect.   Assessment  GERD  Abdominal pain , feels similar to prior diverticuiitis flares. I am less convinced this is the case and need to rule out biliary etiology as well. As this is a holiday weekend, I have sent in empiric abx but with caveat further indepth imaging if no improvement   Early satiety   Duodenal tubulovillous adenoma  PLAN    Start Pantoprazole  daily Empiric abx for diverticulitis US  abdomen 4-6 week return CT if no improvement Enteroscopy in future to be arranged after acute symptoms addressed   Shelia MICAEL Stager, PhD, ANP-BC [facility]    "

## 2024-02-19 ENCOUNTER — Ambulatory Visit: Payer: Self-pay | Admitting: Pulmonary Disease

## 2024-02-21 ENCOUNTER — Telehealth: Payer: Self-pay

## 2024-02-21 NOTE — Telephone Encounter (Signed)
 Returned the pt's call back and was advised that she is still having swelling (bloating) in her abdomen and feels a little pinching sensation here and there. I advised the pt to continue her antibiotics and keep apt for US  on Thursday and that will give us  more answers to help her situation. Pt expressed understanding and agreed to instructions.

## 2024-02-23 ENCOUNTER — Ambulatory Visit (HOSPITAL_COMMUNITY): Admission: RE | Admit: 2024-02-23

## 2024-02-23 DIAGNOSIS — R6881 Early satiety: Secondary | ICD-10-CM | POA: Diagnosis present

## 2024-02-23 DIAGNOSIS — R140 Abdominal distension (gaseous): Secondary | ICD-10-CM | POA: Diagnosis present

## 2024-02-23 DIAGNOSIS — R109 Unspecified abdominal pain: Secondary | ICD-10-CM | POA: Diagnosis present

## 2024-02-23 NOTE — Telephone Encounter (Signed)
 Noted. If she continues to have abdominal pain that is similar to her diverticulitis episodes, we need to do a stat CT scan.

## 2024-02-23 NOTE — Telephone Encounter (Signed)
 Phoned the pt and advised of your recommendations, then the pt stopped me and advised that she had a US  done today and is anxious regarding the results, I advised her this was too early to receive results but we will call her. Pt stated she is feeling better but she is anxious to see what US  says and wants to know if you still want her to do a stat CT. The pt has even stated to please MyChart her if it is over the weekend. I advised her that I would advise this.

## 2024-02-24 ENCOUNTER — Telehealth: Payer: Self-pay

## 2024-02-24 NOTE — Telephone Encounter (Signed)
 That is fine with me.

## 2024-02-24 NOTE — Telephone Encounter (Signed)
 Work note done for the pt and put up front for the pt to pick up. [name] is phoning the pt. The pt is coming on Monday to pick up (after advising us  she needed it by the end of the day)

## 2024-02-24 NOTE — Telephone Encounter (Signed)
 Pt phoned early this am according to [facility] then showed up wanting her work note extended to reflect being out the 16th too. I advised her I would send a note to you but you are off on Fridays. Pt has MyChart and was advised to watch it incase you send something.

## 2024-02-28 ENCOUNTER — Telehealth: Payer: Self-pay | Admitting: Gastroenterology

## 2024-02-28 ENCOUNTER — Ambulatory Visit

## 2024-02-28 DIAGNOSIS — Z122 Encounter for screening for malignant neoplasm of respiratory organs: Secondary | ICD-10-CM

## 2024-02-28 DIAGNOSIS — Z87891 Personal history of nicotine dependence: Secondary | ICD-10-CM

## 2024-02-28 NOTE — Telephone Encounter (Signed)
 Patient came by the office to see if she could get the results from the test she had on Thursday.

## 2024-02-28 NOTE — Patient Instructions (Addendum)
 Thank you for participating in the [place] Scre[facility]. It was our pleasure to meet you today. We will call you with the results of your scan within the next few days. Your scan will be assigned a Lung RADS category score by the physicians reading the scans.  This Lung RADS score determines follow up scanning.  See below for description of categories, and follow up screening recommendations. We will be in touch to schedule your follow up screening annually or based on recommendations of our providers. We will fax a copy of your scan results to your Primary Care Physician, or the physician who referred you to the program, to ensure they have the results. Please call the office if you have any questions or concerns regarding your scanning experience or results.  Our office number is [phone]. Please speak with Karna Curly, RN., Karna Doom RN, or [facility] RN, and Isaiah Dover RN. They are  our Lung Cancer Screening RN.'s If They are unavailable when you call, Please leave a message on the voice mail. We will return your call at our earliest convenience.This voice mail is monitored several times a day.  Remember, if your scan is normal, we will scan you annually as long as you continue to meet the criteria for the program. (Age 45-80, Current smoker or smoker who has quit within the last 15 years). If you are a former smoker, counselling psychologist. We are proud of you! Remain smoke free! Remember you can refer friends or family members through the number above.  We will screen them to make sure they meet criteria for the program. Thank you for helping us  take better care of you by participating in Lung Screening. For Virtual Smoking Cessation Classes , The American Lung Association Provides  Freedom From Smoking Classes.  Please search their website for dates and times.    Lung RADS Categories:  Lung RADS 1: no nodules or definitely non-concerning nodules.   Recommendation is for a repeat annual scan in 12 months.  Lung RADS 2:  nodules that are non-concerning in appearance and behavior with a very low likelihood of becoming an active cancer. Recommendation is for a repeat annual scan in 12 months.  Lung RADS 3: nodules that are probably non-concerning , includes nodules with a low likelihood of becoming an active cancer.  Recommendation is for a 79-month repeat screening scan. Often noted after an upper respiratory illness. We will be in touch to make sure you have no questions, and to schedule your 44-month scan.  Lung RADS 4 A: nodules with concerning findings, recommendation is most often for a follow up scan in 3 months or additional testing based on our provider's assessment of the scan. We will be in touch to make sure you have no questions and to schedule the recommended 3 month follow up scan.  Lung RADS 4 B:  indicates findings that are concerning. We will be in touch with you to schedule additional diagnostic testing based on our provider's  assessment of the scan.  You can receive free nicotine replacement therapy ( patches, gum or mints) by calling 1-800-QUIT NOW. Please call so we can get you on the path to becoming  a non-smoker. I know it is hard, but you can do this!  Other options for assistance in smoking cessation ( As covered by your insurance benefits)  **Hypnosis for smoking cessationKeyspan. [phone]  Acupuncture for smoking cessation  [employer] [phone]  Your CT scan is scheduled for 03/06/2024 at 2:30 pm at [facility].

## 2024-02-28 NOTE — Progress Notes (Signed)
 Virtual Visit via Telephone Note  I connected with [name] on 02/28/2024 at  3:00 PM EST by telephone and verified that I am speaking with the correct person using two identifiers.  Location: Patient: At home, in [place]  Provider: At home, in [place]    I discussed the limitations, risks, security and privacy concerns of performing an evaluation and management service by telephone and the availability of in person appointments. I also discussed with the patient that there may be a patient responsible charge related to this service. The patient expressed understanding and agreed to proceed.  Shared Decision Making Visit Lung Cancer Screening Program [phone])   Eligibility: Age 68 y.o. Pack Years Smoking History Calculation 32 pack years (# packs/per year x # years smoked) Recent History of coughing up blood  no Unexplained weight loss? no ( >Than 15 pounds within the last 6 months ) Prior History Lung / other cancer no (Diagnosis within the last 5 years already requiring surveillance chest CT Scans). Smoking Status Former Smoker Former Smokers: Years since quit: < 1 year  Quit Date: 2025, about 6 weeks ago   Visit Components: Discussion included one or more decision making aids. yes Discussion included risk/benefits of screening. yes Discussion included potential follow up diagnostic testing for abnormal scans. yes Discussion included meaning and risk of over diagnosis. yes Discussion included meaning and risk of False Positives. yes Discussion included meaning of total radiation exposure. yes  Counseling Included: Importance of adherence to annual lung cancer LDCT screening. yes Impact of comorbidities on ability to participate in the program. yes Ability and willingness to under diagnostic treatment. yes  Smoking Cessation Counseling: Current Smokers:  Discussed importance of smoking cessation. N/A Information about tobacco cessation classes and interventions provided to  patient. N/A Patient provided with ticket for LDCT Scan. N/A Symptomatic Patient. N/A  Counseling Diagnosis Code: Tobacco Use Z72.0 Asymptomatic Patient N/A  Counseling  Former Smokers:  Discussed the importance of maintaining cigarette abstinence. yes Diagnosis Code: Personal History of Nicotine Dependence. S12.108 Information about tobacco cessation classes and interventions provided to patient. Yes Patient provided with ticket for LDCT Scan. N/A Written Order for Lung Cancer Screening with LDCT placed in Epic. Yes (CT Chest Lung Cancer Screening Low Dose W/O CM) [id number] Z12.2-Screening of respiratory organs Z87.891-Personal history of nicotine dependence  Shared decision visit completed by Wells Georgia, FNP as a registered nurse awaiting credentialing.    Wells CHRISTELLA Georgia, FNP

## 2024-02-28 NOTE — Telephone Encounter (Signed)
 Pt has been made aware we do not have her results yet

## 2024-02-29 ENCOUNTER — Ambulatory Visit: Payer: Self-pay | Admitting: Gastroenterology

## 2024-03-05 ENCOUNTER — Ambulatory Visit (HOSPITAL_BASED_OUTPATIENT_CLINIC_OR_DEPARTMENT_OTHER): Attending: Pulmonary Disease | Admitting: Internal Medicine

## 2024-03-05 DIAGNOSIS — G4736 Sleep related hypoventilation in conditions classified elsewhere: Secondary | ICD-10-CM | POA: Diagnosis not present

## 2024-03-05 DIAGNOSIS — G4730 Sleep apnea, unspecified: Secondary | ICD-10-CM | POA: Insufficient documentation

## 2024-03-06 ENCOUNTER — Ambulatory Visit (HOSPITAL_COMMUNITY)
Admission: RE | Admit: 2024-03-06 | Discharge: 2024-03-06 | Disposition: A | Discharge status: Home / Self Care | Source: Ambulatory Visit | Attending: Acute Care | Admitting: Acute Care

## 2024-03-06 DIAGNOSIS — Z87891 Personal history of nicotine dependence: Secondary | ICD-10-CM | POA: Diagnosis present

## 2024-03-06 DIAGNOSIS — Z122 Encounter for screening for malignant neoplasm of respiratory organs: Secondary | ICD-10-CM | POA: Insufficient documentation

## 2024-03-06 NOTE — Telephone Encounter (Signed)
 Handled by Therisa Stager, NP on yesterday

## 2024-03-10 DIAGNOSIS — R0683 Snoring: Secondary | ICD-10-CM | POA: Diagnosis not present

## 2024-03-10 NOTE — Procedures (Signed)
 [facility] [address] [place], [place] [address] Tel: [phone]   Fax: [phone]  Polysomnography Interpretation  Patient Name:  [name], [name] Study Date:  03/05/2024 Referring Physician:  PAULA SOUTHERLY [phone]) %%startinterp%% Indications for Polysomnography The patient is a 69 year old Female who is 5' 7 and weighs 172.0 lbs. Her BMI equals 27.0.  A full night polysomnogram was performed to evaluate for -.  Medications taken at 2135.  BUSPIRONE   LAMOTRIGINE   ALPRAZOLAM   TRAZODONE    Polysomnogram Data A full night polysomnogram recorded the standard physiologic parameters including EEG, EOG, EMG, EKG, nasal and oral airflow.  Respiratory parameters of chest and abdominal movements were recorded with Respiratory Inductance Plethysmography belts.  Oxygen saturation was recorded by pulse oximetry.   Sleep Architecture The total recording time of the polysomnogram was 446.5 minutes.  The total sleep time was 412.5 minutes.  The patient spent 2.1% of total sleep time in Stage N1, 48.2% in Stage N2, 33.8% in Stages N3, and 15.9% in REM.  Sleep latency was 10.0 minutes.  REM latency was 230.5 minutes.  Sleep Efficiency was 92.4%.  Wake after Sleep Onset time was 24.0 minutes.  Respiratory Events The polysomnogram revealed a presence of - obstructive, 5 central, and 1 mixed apnea resulting in an Apnea index of 0.9 events per hour.  There were 16 hypopneas (>=3% desaturation and/or arousal) resulting in an Apnea\Hypopnea Index (AHI >=3% desaturation and/or arousal) of 3.2 events per hour.  There were 7 hypopneas (>=4% desaturation) resulting in an Apnea\Hypopnea Index (AHI >=4% desaturation) of 1.9 events per hour.  There were 62 Respiratory Effort Related Arousals resulting in a RERA index of 9.0 events per hour. The Respiratory Disturbance Index is 12.2 events per hour.  The snore index was 426.3 events per hour.  Mean oxygen saturation was  90.4%.  The lowest oxygen saturation during sleep was 83.0%.  Time spent <=88% oxygen saturation was 70.3 minutes (15.8%).  Limb Activity There were 126 total limb movements recorded, of this total, 126 were classified as PLMs.  PLM index was 18.3 per hour and PLM associated with Arousals index was - per hour.  Cardiac Summary The average pulse rate was 68.1 bpm.  The minimum pulse rate was 56.0 bpm while the maximum pulse rate was 92.0 bpm.  Cardiac rhythm was normal/abnormal.  Comments: Patient had a diagnostic study performed.  Had been scheduled for split-night study but did not have significant number of events  Diagnosis:  Negative study for significant sleep disordered breathing with an AHI of 3.2, did have significant hypoxia necessitating addition of 1 L of oxygen. Excellent sleep efficiency with fair sleep architecture, increased stage III sleep.  Mild periodic limb movement Cardiac rhythm was sinus Study notable for hypoxia likely related to underlying lung disease. Moderate snoring noted  Recommendations: 1 L of oxygen with sleep for nocturnal desaturations Avoid alcohol, sedatives and other CNS depressants that may worsen sleep apnea and disrupt normal sleep architecture. Sleep hygiene should be reviewed to assess factors that may improve sleep quality. Weight management and regular exercise should be initiated or continued  An oral device may help snoring, however, it is not covered by insurance for snoring  This study was personally reviewed and electronically signed by: Jennet Epley, MD Accredited Board Certified in Sleep Medicine Date/Time:   03/10/24 %%endinterp%%   Diagnostic PSG Report  Patient Name: [name], [name] Study Date: 03/05/2024  Date of Birth: [birth year] Study Type: Diagnostic  Age: 55 year MRN #:  [id number]  Sex: Female Interpreting Physician: NEYSA RAMA, [id number]  Height: 5' 7 Referring Physician: PAULA SOUTHERLY [phone])  Weight: 172.0  lbs Recording Tech: Charlie George RPSGT  BMI: 27.0 Scoring Tech: Charlie George RPSGT  ESS: 4/24 Neck Size: 13.75  Mask Type RESMED AIRFIT P10 NASAL PILLOWS    Mask Size: SMALL Supplemental O2: 1   Study Overview  Lights Off: 09:41:17 PM  Count Index  Lights On: 05:07:45 AM Awakenings: 15 2.2  Time in Bed: 446.5 min. Arousals: 61 8.9  Total Sleep Time: 412.5 min. AHI (>=3% Desat and/or Ar.): 22 3.2   Sleep Efficiency: 92.4% AHI (>=4% Desat): 13 1.9   Sleep Latency: 10.0 min. Limb Movements: 126 18.3  Wake After Sleep Onset: 24.0 min. Snore: 2931 426.3  REM Latency from Sleep Onset: 230.5 min. Desaturations: 24 3.5     Minimum SpO2 TST: 83.0%    Sleep Architecture  % of Time in Bed Stages Time (mins) % Sleep Time  Wake 34.0   Stage N1 8.5 2.1%  Stage N2 199.0 48.2%  Stage N3 139.5 33.8%  REM 65.5 15.9%   Arousal Summary   NREM REM Sleep Index  Respiratory Arousals 51 6 57 8.3  PLM Arousals - - - -  Isolated Limb Movement Arousals - - - -  Snore Arousals 1 - 1 0.1  Spontaneous Arousals 3 - 3 0.4  Total 55 6 61 8.9   Limb Movement Summary   Count Index  Isolated Limb Movements - -  Periodic Limb Movements (PLMs) 126 18.3  Total Limb Movements 126 18.3    Respiratory Summary   By Sleep Stage By Body Position Total   NREM REM Supine Non-Supine   Time (min) 347.0 65.5 70.5 342.0 412.5         Obstructive Apnea - - - - -  Mixed Apnea 1 - - 1 1  Central Apnea 5 - - 5 5  Total Apneas 6 - - 6 6  Total Apnea Index 1.0 - - 1.1 0.9         Hypopneas (>=3% Desat and/or Ar.) 16 - 1 15 16   AHI (>=3% Desat and/or Ar.) 3.8 - 0.9 3.7 3.2         Hypopneas (>=4% Desat) 7 - - 7 7  AHI (>=4% Desat) 2.2 - - 2.3 1.9          RERAs 51 11 7 55 62  RERA Index 8.8 10.1 6.0 9.6 9.0         RDI 12.6 10.1 6.8 13.3 12.2    Respiratory Event Type Index  Central Apneas 0.7  Obstructive Apneas -  Mixed Apneas 0.1  Central Hypopneas -  Obstructive Hypopneas 2.3  Central  Apnea + Hypopnea (CAHI) 0.7  Obstructive Apnea + Hypopnea (OAHI) 2.5   Respiratory Event Durations   Apnea Hypopnea   NREM REM NREM REM  Average (seconds) 13.1 - 31.0 -  Maximum (seconds) 15.6 - 64.0 -    Oxygen Saturation Summary   Wake NREM REM TST TIB  Average SpO2 (%) 89.9% 90.0% 92.4% 90.4% 90.4%  Minimum SpO2 (%) 83.0% 83.0% 91.0% 83.0% 83.0%  Maximum SpO2 (%) 96.0% 97.0% 95.0% 97.0% 97.0%   Oxygen Saturation Distribution  Range (%) Time in range (min) Time in range (%)  90.0 - 100.0 247.0 55.4%  80.0 - 90.0 197.1 44.2%  70.0 - 80.0 - -  60.0 - 70.0 - -  50.0 - 60.0 - -  0.0 - 50.0 - -  Time Spent <=88% SpO2  Range (%) Time in range (min) Time in range (%)  0.0 - 88.0 70.3 15.8%      Count Index  Desaturations 24 3.5    Cardiac Summary   Wake NREM REM Sleep Total  Average Pulse Rate (BPM) 70.6 68.0 67.1 67.9 68.1  Minimum Pulse Rate (BPM) 64.0 56.0 59.0 56.0 56.0  Maximum Pulse Rate (BPM) 92.0 84.0 75.0 84.0 92.0   Pulse Rate Distribution:  Range (bpm) Time in range (min) Time in range (%)  0.0 - 40.0 - -  40.0 - 60.0 1.8 0.4%  60.0 - 80.0 440.7 98.7%  80.0 - 100.0 2.1 0.5%  100.0 - 120.0 - -  120.0 - 140.0 - -  140.0 - 200.0 - -   Supplemental O2 Summary  O2 Level Time (min) Wake (min) NREM (min) REM (min) Supine TST (min) Sleep Eff% OA# CA# MA# Hyp# (>=3%) AHI (>=3%) Hyp# (>=4%) AHI (>=%4) RERA RDI SpO2 <=88% (min) Min SpO2 Mean SpO2 Ar. Index  - 88.0 25.5 62.5 0.0  71.0% - 1 - 3 3.8 2  2.9 14  17.3  47.0 83.0 86.5 13.4  O2: 1.00 358.5 8.5 284.5 65.5  70.5 97.6% - 4 1 13  3.1 5  1.7 48  11.3  16.7 87.0 91.1 8.1   Hypnograms                      Technologist Comments            The patient arrived for a split night study. The patient was placed in room 6. The procedure was explained, and questions were answered. The patient was fitted with a mask and trialed CPAP at a pressure of 5 cm H2O prior to the start of the study.             The patient did not have a high enough AHI to meet split night criteria. The patient was fitted with a small ResMed AirFit P10 nasal pillows mask. Due to low saturations without the presence of events, the patient was placed on 1 LPM of supplemental oxygen.           The patient slept in the right and supine positions. Oral venting and mild PLMs were observed. No bruxism, seizure, or spike wave activity was observed. Snoring was moderate and partially audible. No ECG events were noted. All stages of sleep were recorded. The patient had no bathroom breaks.

## 2024-03-10 NOTE — Procedures (Addendum)
" °  Indications for Polysomnography The patient is a 69 year old Female who is 5' 7 and weighs 172.0 lbs. Her BMI equals 27.0.  A full night polysomnogram was performed to evaluate for -.  Medications taken at 2135.BUSPIRONELAMOTRIGINEALPRAZOLAMTRAZODONE Polysomnogram Data A full night polysomnogram recorded the standard physiologic parameters including EEG, EOG, EMG, EKG, nasal and oral airflow.  Respiratory parameters of chest and abdominal movements were recorded with Respiratory Inductance Plethysmography belts.   Oxygen saturation was recorded by pulse oximetry.  Sleep Architecture The total recording time of the polysomnogram was 446.5 minutes.  The total sleep time was 412.5 minutes.  The patient spent 2.1% of total sleep time in Stage N1, 48.2% in Stage N2, 33.8% in Stages N3, and 15.9% in REM.  Sleep latency was 10.0 minutes.   REM latency was 230.5 minutes.  Sleep Efficiency was 92.4%.  Wake after Sleep Onset time was 24.0 minutes.  Respiratory Events The polysomnogram revealed a presence of - obstructive, 5 central, and 1 mixed apnea resulting in an Apnea index of 0.9 events per hour.  There were 16 hypopneas (GreaterEqual to3% desaturation and/or arousal) resulting in an Apnea\Hypopnea Index (AHI  GreaterEqual to3% desaturation and/or arousal) of 3.2 events per hour.  There were 7 hypopneas (GreaterEqual to4% desaturation) resulting in an Apnea\Hypopnea Index (AHI GreaterEqual to4% desaturation) of 1.9 events per hour.  There were 62 Respiratory  Effort Related Arousals resulting in a RERA index of 9.0 events per hour. The Respiratory Disturbance Index is 12.2 events per hour.  The snore index was 426.3 events per hour.  Mean oxygen saturation was 90.4%.  The lowest oxygen saturation during sleep was 83.0%.  Time spent LessEqual to88% oxygen saturation was  minutes ().  Limb Activity There were 126 total limb movements recorded, of this total, 126 were classified as PLMs.  PLM index  was 18.3 per hour and PLM associated with Arousals index was - per hour.  Cardiac Summary The average pulse rate was 68.1 bpm.  The minimum pulse rate was 56.0 bpm while the maximum pulse rate was 92.0 bpm.  Cardiac rhythm was normal/abnormal.  Comments: Patient had a diagnostic study performed.  Had been scheduled for split-night study but did not have significant number of events  Diagnosis: Negative study for significant sleep disordered breathing with an AHI of 3.2, did have significant hypoxia necessitating addition of 1 L of oxygen. Excellent sleep efficiency with fair sleep architecture, increased stage III sleep.  Mild periodic limb movement Cardiac rhythm was sinus Study notable for hypoxia likely related to underlying lung disease. Moderate snoring noted  Recommendations: 1 L of oxygen with sleep for nocturnal desaturations Avoid alcohol, sedatives and other CNS depressants that may worsen sleep apnea and disrupt normal sleep architecture. Sleep hygiene should be reviewed to assess factors that may improve sleep quality. Weight management and regular exercise should be initiated or continued  An oral device may help snoring, however, it is not covered by insurance for snoring  This study was personally reviewed and electronically signed by: Jennet Epley, MD Accredited Board Certified in Sleep Medicine Date/Time:  03/10/24 "

## 2024-03-11 NOTE — Progress Notes (Unsigned)
 "  Established Patient Pulmonology Office Visit   Subjective:  Patient ID: [name], female    DOB: 01-29-1956  MRN: [id number]  CC: No chief complaint on file.   HPI  [name] is a 69 y.o. female with anxiety, BPD, HLD, and nicotine  dependence who presents for initial consultation in the setting of abnormal chest imaging.   Last seen on 01/02/2024, suspected COPD and OSA, ordered PFTs and sleep testing. Referred to lung cancer screening program.    {PULM QUESTIONNAIRES (Optional):33196}  ROS  {History (Optional):23778} Current Medications[1]      Objective:  There were no vitals taken for this visit. {Pulm Vitals (Optional):32837}  Physical Exam   Diagnostic Review:  {Labs (Optional):32838}  CXR 10/2023: hyperinflation  Sleep study 03/05/2024: Diagnosis:  Negative study for significant sleep disordered breathing with an AHI of 3.2, did have significant hypoxia necessitating addition of 1 L of oxygen. Excellent sleep efficiency with fair sleep architecture, increased stage III sleep.  Mild periodic limb movement Cardiac rhythm was sinus Study notable for hypoxia likely related to underlying lung disease. Moderate snoring noted   Recommendations: 1 L of oxygen with sleep for nocturnal desaturations Avoid alcohol, sedatives and other CNS depressants that may worsen sleep apnea and disrupt normal sleep architecture. Sleep hygiene should be reviewed to assess factors that may improve sleep quality. Weight management and regular exercise should be initiated or continued   An oral device may help snoring, however, it is not covered by insurance for snoring   PFT 02/2024: moderate obstructive lung disease, marked bronchodilator response, hyperinflation.  LDCT 03/06/2024: reviewed independently and shows upper lobe emphysematous changes, no concerning nodules     Assessment & Plan:   Assessment & Plan Sleep-disordered breathing   No orders of  the defined types were placed in this encounter.     No follow-ups on file.   Vilda Zollner, MD    [1]  Current Outpatient Medications:    acetaminophen  (TYLENOL ) 500 MG tablet, Take 500 mg by mouth every 6 (six) hours as needed for mild pain or moderate pain., Disp: , Rfl:    ALPRAZolam  (XANAX ) 0.5 MG tablet, TAKE 1 TABLET(0.5 MG) BY MOUTH THREE TIMES DAILY AS NEEDED FOR ANXIETY, Disp: 90 tablet, Rfl: 5   busPIRone  (BUSPAR ) 15 MG tablet, TAKE 1 TABLET(15 MG) BY MOUTH TWICE DAILY, Disp: 180 tablet, Rfl: 3   dicyclomine  (BENTYL ) 20 MG tablet, TAKE 1 TABLET(20 MG) BY MOUTH THREE TIMES DAILY AS NEEDED FOR ABDOMINAL PAIN, Disp: 30 tablet, Rfl: 1   fluorouracil (EFUDEX) 5 % cream, Apply 1 Application topically daily. (Patient not taking: Reported on 02/16/2024), Disp: , Rfl:    furosemide (LASIX) 20 MG tablet, Take 20 mg by mouth daily as needed for fluid or edema., Disp: , Rfl:    lamoTRIgine  (LAMICTAL ) 200 MG tablet, Take 1 tablet (200 mg total) by mouth 2 (two) times daily., Disp: 200 tablet, Rfl: 3   meclizine  (ANTIVERT ) 25 MG tablet, Take 1 tablet (25 mg total) by mouth 2 (two) times daily as needed for dizziness., Disp: 30 tablet, Rfl: 0   ondansetron  (ZOFRAN ) 4 MG tablet, Take 1 tablet (4 mg total) by mouth every 8 (eight) hours as needed for nausea or vomiting., Disp: 30 tablet, Rfl: 1   pantoprazole  (PROTONIX ) 40 MG tablet, Take 1 tablet (40 mg total) by mouth daily. 30 minutes before breakfast, Disp: 90 tablet, Rfl: 3   traZODone  (DESYREL ) 100 MG tablet, Take 1-2 tablets (100-200 mg total)  by mouth at bedtime as needed for sleep., Disp: 180 tablet, Rfl: 3  "

## 2024-03-12 ENCOUNTER — Ambulatory Visit: Admitting: Pulmonary Disease

## 2024-03-12 ENCOUNTER — Encounter: Payer: Self-pay | Admitting: Pulmonary Disease

## 2024-03-12 VITALS — BP 114/77 | HR 77 | Ht 67.0 in | Wt 177.0 lb

## 2024-03-12 DIAGNOSIS — J9611 Chronic respiratory failure with hypoxia: Secondary | ICD-10-CM | POA: Diagnosis not present

## 2024-03-12 DIAGNOSIS — F17210 Nicotine dependence, cigarettes, uncomplicated: Secondary | ICD-10-CM

## 2024-03-12 DIAGNOSIS — J439 Emphysema, unspecified: Secondary | ICD-10-CM

## 2024-03-12 DIAGNOSIS — J42 Unspecified chronic bronchitis: Secondary | ICD-10-CM

## 2024-03-12 DIAGNOSIS — G4730 Sleep apnea, unspecified: Secondary | ICD-10-CM

## 2024-03-12 MED ORDER — NICOTINE 14 MG/24HR TD PT24: MEDICATED_PATCH | TRANSDERMAL | 0 refills | Status: AC

## 2024-03-12 MED ORDER — NICOTINE 7 MG/24HR TD PT24: MEDICATED_PATCH | TRANSDERMAL | 0 refills | Status: AC

## 2024-03-12 MED ORDER — NICOTINE POLACRILEX 4 MG MT LOZG: LOZENGE | OROMUCOSAL | 0 refills | Status: AC

## 2024-03-12 MED ORDER — NICOTINE 21 MG/24HR TD PT24: MEDICATED_PATCH | TRANSDERMAL | 0 refills | Status: AC

## 2024-03-12 MED ORDER — STIOLTO RESPIMAT 2.5-2.5 MCG/ACT IN AERS: 2.0000 | INHALATION_SPRAY | Freq: Every day | RESPIRATORY_TRACT | 6 refills | Status: AC

## 2024-03-12 MED ORDER — ALBUTEROL SULFATE HFA 108 (90 BASE) MCG/ACT IN AERS: 2.0000 | INHALATION_SPRAY | Freq: Four times a day (QID) | RESPIRATORY_TRACT | 6 refills | Status: AC | PRN

## 2024-03-12 NOTE — Patient Instructions (Signed)
" °  VISIT SUMMARY: Today, we evaluated your lung condition and discussed smoking cessation. Your sleep study showed no sleep apnea but indicated low oxygen levels at night, for which you are using supplemental oxygen. Your recent CT scan and breathing tests confirmed mild to moderate COPD with emphysema and chronic bronchitis. We discussed the importance of quitting smoking and started you on a new inhaler and nicotine  replacement therapy.  YOUR PLAN: CHRONIC OBSTRUCTIVE PULMONARY DISEASE (COPD) WITH CHRONIC BRONCHITIS AND EMPHYSEMA: You have mild to moderate COPD with emphysema and chronic bronchitis. Your CT scan showed mild emphysema, and your breathing tests indicated COPD grade 2 with some improvement after using albuterol . -Use the Stiolto inhaler, two puffs every morning. -Use the albuterol  inhaler as needed for coughing or wheezing. -You are referred to a pulmonary rehabilitation program. -It is crucial to quit smoking. Consider using nicotine  replacement therapy.  CHRONIC HYPOXIC RESPIRATORY FAILURE: Your sleep study showed low oxygen levels at night, which can lead to complications if not treated. -Use oxygen therapy at 1 liter per minute during sleep. -An oxygen concentrator for home use has been ordered.  NICOTINE  DEPENDENCE, CIGARETTES: You have nicotine  dependence with a recent relapse. Quitting smoking is critical to prevent your COPD from worsening. -Start using nicotine  patches at 21 mg, then taper to 14 mg and 7 mg. -Use nicotine  lozenges as needed for cravings. -Participate in the pulmonary rehabilitation program for support in quitting smoking.  SLEEP-DISORDERED BREATHING: Your sleep study was negative for sleep apnea, but your low oxygen levels at night are likely due to COPD. -Continue using oxygen therapy during sleep. 1 L every night. I put in the order for it.  Contains text generated by [employer].  "

## 2024-03-16 ENCOUNTER — Telehealth (HOSPITAL_BASED_OUTPATIENT_CLINIC_OR_DEPARTMENT_OTHER): Payer: Self-pay

## 2024-03-16 ENCOUNTER — Other Ambulatory Visit: Payer: Self-pay

## 2024-03-16 DIAGNOSIS — Z122 Encounter for screening for malignant neoplasm of respiratory organs: Secondary | ICD-10-CM

## 2024-03-16 DIAGNOSIS — Z87891 Personal history of nicotine dependence: Secondary | ICD-10-CM

## 2024-03-16 DIAGNOSIS — F17210 Nicotine dependence, cigarettes, uncomplicated: Secondary | ICD-10-CM

## 2024-03-16 NOTE — Telephone Encounter (Signed)
 Called patient and informed her that radiologist is obligated to report anything abnormal that they see. The pulmonary artery is enlarged but that does not mean she has pulmonary hypertension. I told her that we can get an echocardiogram to evaluate it but given her symptoms, I don't suspect she has significant degrees of pulmonary hypertension. I discussed warning signs including intermittent chest pain, lower extremity edema, worsening shortness of breath. She will call us  if she develops those symptoms and at that point, we will order echo. For now, will hold off. I'll see her in office in 4 months.

## 2024-03-16 NOTE — Telephone Encounter (Unsigned)
 Copied from CRM #[id number]. Topic: Clinical - Medical Advice >> Mar 16, 2024  9:58 AM Ismael A wrote: Reason for CRM: patient is calling regarding her diagnosis of pulmonary arterial hypertension - she states she researched the condition online and is concerned about the possible effects this condition may have on her heart concerning heart failure - please call back at earliest convenience

## 2024-03-20 ENCOUNTER — Telehealth (HOSPITAL_BASED_OUTPATIENT_CLINIC_OR_DEPARTMENT_OTHER): Payer: Self-pay

## 2024-03-20 NOTE — Telephone Encounter (Signed)
 The advair is an asthma inhaler not a COPD inhaler. She can ask insurance about Incruse or Bevespi. If those are more affordable, we can switch to that.

## 2024-03-20 NOTE — Telephone Encounter (Signed)
 Copied from CRM [id number]. Topic: Clinical - Medication Question >> Mar 20, 2024 10:49 AM Russell PARAS wrote: Reason for CRM:   Pt is contacting clinic regarding her prescribed Stiolto. She was advised by pharmacy her copay would be $575/per quarter. Was advised by her insurance the following medications would be in tier:  Advair Diskus- $159 per quarter after deductible Generic for Advair- Advair Fluticasone - $18.97 per quarter after deductible  Pt wants to know if either of the alternate medications would be as effective as Stiolto, if not she would like to remain taking Stiolto  Requested call back from nurse.  CB#  [phone]

## 2024-03-21 ENCOUNTER — Other Ambulatory Visit: Payer: Self-pay | Admitting: Pulmonary Disease

## 2024-03-21 DIAGNOSIS — J42 Unspecified chronic bronchitis: Secondary | ICD-10-CM

## 2024-03-21 DIAGNOSIS — J9611 Chronic respiratory failure with hypoxia: Secondary | ICD-10-CM

## 2024-03-21 NOTE — Telephone Encounter (Signed)
 Left pt message to return call and let us  know pricing on the COPD inhalers mentioned in message

## 2024-04-06 NOTE — Telephone Encounter (Signed)
 Left pt message to return call

## 2024-04-10 NOTE — Telephone Encounter (Signed)
 Left pt message to return call

## 2024-04-17 ENCOUNTER — Ambulatory Visit: Admitting: Gastroenterology

## 2024-06-28 ENCOUNTER — Ambulatory Visit: Admitting: Physician Assistant

## 2024-07-05 ENCOUNTER — Ambulatory Visit: Admitting: Pulmonary Disease
